# Patient Record
Sex: Female | Born: 1990 | Race: Black or African American | Hispanic: No | Marital: Single | Smoking: Current every day smoker
Health system: Southern US, Community
[De-identification: ages and names within clinical notes are randomized; demographics above are authoritative.]

## PROBLEM LIST (undated history)

## (undated) ENCOUNTER — Inpatient Hospital Stay (HOSPITAL_COMMUNITY): Payer: Self-pay

## (undated) DIAGNOSIS — E785 Hyperlipidemia, unspecified: Secondary | ICD-10-CM

## (undated) DIAGNOSIS — E119 Type 2 diabetes mellitus without complications: Secondary | ICD-10-CM

## (undated) DIAGNOSIS — I10 Essential (primary) hypertension: Secondary | ICD-10-CM

## (undated) DIAGNOSIS — Z720 Tobacco use: Secondary | ICD-10-CM

## (undated) DIAGNOSIS — T7840XA Allergy, unspecified, initial encounter: Secondary | ICD-10-CM

## (undated) HISTORY — DX: Essential (primary) hypertension: I10

## (undated) HISTORY — DX: Tobacco use: Z72.0

## (undated) HISTORY — DX: Allergy, unspecified, initial encounter: T78.40XA

## (undated) HISTORY — PX: WISDOM TOOTH EXTRACTION: SHX21

## (undated) HISTORY — DX: Hyperlipidemia, unspecified: E78.5

---
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
## 2011-02-15 LAB — CBC
HCT: 30.9 % — ABNORMAL LOW (ref 36.0–46.0)
HCT: 36.5 % (ref 36.0–46.0)
Hemoglobin: 10.6 g/dL — ABNORMAL LOW (ref 12.0–15.0)
Hemoglobin: 12.4 g/dL (ref 12.0–15.0)
MCH: 30.5 pg (ref 26.0–34.0)
MCH: 31 pg (ref 26.0–34.0)
MCHC: 33.9 g/dL (ref 30.0–36.0)
MCHC: 34.4 g/dL (ref 30.0–36.0)
MCV: 90 fL (ref 78.0–100.0)
MCV: 90.1 fL (ref 78.0–100.0)
Platelets: 256 10*3/uL (ref 150–400)
Platelets: 325 10*3/uL (ref 150–400)
RBC: 3.43 MIL/uL — ABNORMAL LOW (ref 3.87–5.11)
RBC: 4.05 MIL/uL (ref 3.87–5.11)
RDW: 13.5 % (ref 11.5–15.5)
RDW: 13.6 % (ref 11.5–15.5)
WBC: 17.9 10*3/uL — ABNORMAL HIGH (ref 4.0–10.5)
WBC: 9.6 10*3/uL (ref 4.0–10.5)

## 2011-02-15 LAB — URINE CULTURE
Colony Count: 8000
Culture  Setup Time: 201110290442
Special Requests: NEGATIVE

## 2011-02-15 LAB — URINE MICROSCOPIC-ADD ON

## 2011-02-15 LAB — URINALYSIS, ROUTINE W REFLEX MICROSCOPIC
Bilirubin Urine: NEGATIVE
Glucose, UA: NEGATIVE mg/dL
Ketones, ur: NEGATIVE mg/dL
Nitrite: NEGATIVE
Protein, ur: NEGATIVE mg/dL
Specific Gravity, Urine: 1.015 (ref 1.005–1.030)
Urobilinogen, UA: 1 mg/dL (ref 0.0–1.0)
pH: 6.5 (ref 5.0–8.0)

## 2011-02-15 LAB — RPR: RPR Ser Ql: NONREACTIVE

## 2011-03-07 LAB — URINALYSIS, ROUTINE W REFLEX MICROSCOPIC
Bilirubin Urine: NEGATIVE
Glucose, UA: NEGATIVE mg/dL
Hgb urine dipstick: NEGATIVE
Ketones, ur: NEGATIVE mg/dL
Nitrite: NEGATIVE
Protein, ur: NEGATIVE mg/dL
Specific Gravity, Urine: 1.01 (ref 1.005–1.030)
Urobilinogen, UA: 0.2 mg/dL (ref 0.0–1.0)
pH: 7 (ref 5.0–8.0)

## 2011-03-07 LAB — PREGNANCY, URINE: Preg Test, Ur: NEGATIVE

## 2011-03-08 LAB — URINALYSIS, ROUTINE W REFLEX MICROSCOPIC
Bilirubin Urine: NEGATIVE
Glucose, UA: NEGATIVE mg/dL
Hgb urine dipstick: NEGATIVE
Ketones, ur: NEGATIVE mg/dL
Nitrite: NEGATIVE
Protein, ur: NEGATIVE mg/dL
Specific Gravity, Urine: 1.025 (ref 1.005–1.030)
Urobilinogen, UA: 0.2 mg/dL (ref 0.0–1.0)
pH: 6 (ref 5.0–8.0)

## 2011-03-09 LAB — CBC
HCT: 33.3 % — ABNORMAL LOW (ref 36.0–49.0)
HCT: 29.2 % — ABNORMAL LOW (ref 36.0–49.0)
HCT: 35 % — ABNORMAL LOW (ref 36.0–49.0)
HCT: 37.5 % (ref 36.0–49.0)
Hemoglobin: 11.7 g/dL — ABNORMAL LOW (ref 12.0–16.0)
Hemoglobin: 10 g/dL — ABNORMAL LOW (ref 12.0–16.0)
Hemoglobin: 11.6 g/dL — ABNORMAL LOW (ref 12.0–16.0)
Hemoglobin: 12.6 g/dL (ref 12.0–16.0)
MCHC: 35.1 g/dL (ref 31.0–37.0)
MCHC: 33.2 g/dL (ref 31.0–37.0)
MCHC: 33.5 g/dL (ref 31.0–37.0)
MCHC: 34.2 g/dL (ref 31.0–37.0)
MCV: 88.3 fL (ref 78.0–98.0)
MCV: 90.2 fL (ref 78.0–98.0)
MCV: 91.5 fL (ref 78.0–98.0)
MCV: 91.8 fL (ref 78.0–98.0)
Platelets: 262 10*3/uL (ref 150–400)
Platelets: 224 10*3/uL (ref 150–400)
Platelets: 258 10*3/uL (ref 150–400)
Platelets: 283 10*3/uL (ref 150–400)
RBC: 3.77 MIL/uL — ABNORMAL LOW (ref 3.80–5.70)
RBC: 3.19 MIL/uL — ABNORMAL LOW (ref 3.80–5.70)
RBC: 3.81 MIL/uL (ref 3.80–5.70)
RBC: 4.15 MIL/uL (ref 3.80–5.70)
RDW: 13.7 % (ref 11.4–15.5)
RDW: 13.5 % (ref 11.4–15.5)
RDW: 14.1 % (ref 11.4–15.5)
RDW: 14.2 % (ref 11.4–15.5)
WBC: 9 10*3/uL (ref 4.5–13.5)
WBC: 11 10*3/uL (ref 4.5–13.5)
WBC: 11.9 10*3/uL (ref 4.5–13.5)
WBC: 16.2 10*3/uL — ABNORMAL HIGH (ref 4.5–13.5)

## 2011-03-09 LAB — DIFFERENTIAL
Basophils Absolute: 0 10*3/uL (ref 0.0–0.1)
Basophils Relative: 1 % (ref 0–1)
Eosinophils Absolute: 0.2 10*3/uL (ref 0.0–1.2)
Eosinophils Relative: 2 % (ref 0–5)
Lymphocytes Relative: 18 % — ABNORMAL LOW (ref 24–48)
Lymphs Abs: 1.6 10*3/uL (ref 1.1–4.8)
Monocytes Absolute: 0.6 10*3/uL (ref 0.2–1.2)
Monocytes Relative: 7 % (ref 3–11)
Neutro Abs: 6.5 10*3/uL (ref 1.7–8.0)
Neutrophils Relative %: 73 % — ABNORMAL HIGH (ref 43–71)

## 2011-03-09 LAB — BASIC METABOLIC PANEL
BUN: 3 mg/dL — ABNORMAL LOW (ref 6–23)
CO2: 25 mEq/L (ref 19–32)
Calcium: 8.8 mg/dL (ref 8.4–10.5)
Chloride: 108 mEq/L (ref 96–112)
Creatinine, Ser: 0.54 mg/dL (ref 0.4–1.2)
Glucose, Bld: 110 mg/dL — ABNORMAL HIGH (ref 70–99)
Potassium: 3.4 mEq/L — ABNORMAL LOW (ref 3.5–5.1)
Sodium: 138 mEq/L (ref 135–145)

## 2011-03-09 LAB — RPR: RPR Ser Ql: NONREACTIVE

## 2011-03-11 LAB — URINALYSIS, ROUTINE W REFLEX MICROSCOPIC
Bilirubin Urine: NEGATIVE
Glucose, UA: NEGATIVE mg/dL
Hgb urine dipstick: NEGATIVE
Nitrite: NEGATIVE
Protein, ur: NEGATIVE mg/dL
Specific Gravity, Urine: >1.03 — ABNORMAL HIGH (ref 1.005–1.030)
Urobilinogen, UA: 1 mg/dL (ref 0.0–1.0)
pH: 6 (ref 5.0–8.0)

## 2011-03-21 LAB — URINE CULTURE: Colony Count: >=100000

## 2011-03-21 LAB — URINALYSIS, ROUTINE W REFLEX MICROSCOPIC
Bilirubin Urine: NEGATIVE
Glucose, UA: NEGATIVE mg/dL
Hgb urine dipstick: NEGATIVE
Ketones, ur: NEGATIVE mg/dL
Nitrite: POSITIVE — AB
Protein, ur: NEGATIVE mg/dL
Specific Gravity, Urine: 1.01 (ref 1.005–1.030)
Urobilinogen, UA: 1 mg/dL (ref 0.0–1.0)
pH: 7 (ref 5.0–8.0)

## 2011-03-21 LAB — PREGNANCY, URINE: Preg Test, Ur: POSITIVE

## 2011-03-21 LAB — URINE MICROSCOPIC-ADD ON

## 2012-03-15 ENCOUNTER — Encounter (HOSPITAL_COMMUNITY): Payer: Self-pay | Admitting: Emergency Medicine

## 2012-03-15 ENCOUNTER — Emergency Department (HOSPITAL_COMMUNITY)
Admission: EM | Admit: 2012-03-15 | Discharge: 2012-03-15 | Disposition: A | Discharge status: Home / Self Care | Payer: Medicaid Other | Attending: Emergency Medicine | Admitting: Emergency Medicine

## 2012-03-15 DIAGNOSIS — M542 Cervicalgia: Secondary | ICD-10-CM | POA: Insufficient documentation

## 2012-03-15 DIAGNOSIS — F17200 Nicotine dependence, unspecified, uncomplicated: Secondary | ICD-10-CM | POA: Insufficient documentation

## 2012-03-15 DIAGNOSIS — M62838 Other muscle spasm: Secondary | ICD-10-CM

## 2012-03-15 MED ORDER — HYDROCODONE-ACETAMINOPHEN 5-325 MG PO TABS: ORAL_TABLET | ORAL | Status: AC

## 2012-03-15 MED ORDER — METHOCARBAMOL 500 MG PO TABS: 1000.0000 mg | ORAL_TABLET | Freq: Four times a day (QID) | ORAL | Status: AC | PRN

## 2012-03-15 MED ORDER — NAPROXEN 250 MG PO TABS: 250.0000 mg | ORAL_TABLET | Freq: Two times a day (BID) | ORAL | Status: DC

## 2012-03-15 NOTE — ED Notes (Signed)
Pt c/o neck being stiff since this am

## 2012-03-15 NOTE — Discharge Instructions (Signed)
RESOURCE GUIDE  Dental Problems  Patients with Medicaid: [facility]                     [facility] [address]                                           [address] Phone:  [phone]                                                  Phone:  [phone]  If unable to pay or uninsured, contact:  [facility] or [facility] to become qualified for the adult dental clinic.  Chronic Pain Problems Contact [facility]  [phone] Patients need to be referred by their primary care doctor.  Insufficient Money for Medicine Contact [employer]:  call "211" or [facility] 271-5999.  No Primary Care Doctor Call [facility]  832-8000 Other agencies that provide inexpensive medical care    [facility]  [phone]    [facility]  [phone]    [facility]  [phone]    [facility]  [phone]    [facility]  [phone]    [facility]  [phone]  Psychological Services [facility]  [phone] [facility]  [phone] [facility]   [phone] (emergency services [phone])  Substance Abuse Resources [facility]  336-882-2125 [facility] 336-784-9470 [facility] 336-285-9073 [facility] 336-845-3988 Residential & Outpatient Substance Abuse Program  800-659-3381  Abuse/Neglect [place] Child Abuse Hotline (336) 641-3795 [place] Child Abuse Hotline 800-378-5315 (After Hours)  Emergency Shelter [facility] (336) 271-5985  [facility] (336) 275-9566 [facility] (704) 372-4663  MRSA Hotline #:   832-7006    [place] [facility]  [facility] [place]     [employer]                          [facility] [address] [place]                       [address]      [address]  [place]                                                [place]                            [place] Phone:  [phone]                                   Phone:  [phone]                 Phone:  [phone]  [facility] Phone:  [phone]    [place] Child Abuse Hotline (336) 342-1394 (336) 342-3537 (After Hours)    Take the prescriptions as directed.  Apply moist heat or ice to the area(s) of discomfort, for 15 minutes at a time, several times per day for the next few days.  Do not fall asleep on a heating or ice pack.  Call your regular medical doctor today to schedule a follow up appointment within the next week.  Return to the Emergency Department immediately if worsening.  

## 2012-03-15 NOTE — ED Provider Notes (Signed)
History   This chart was scribed for Laray Anger, DO by Sofie Rower. The patient was seen in room APA04/APA04 and the patient's care was started at 12:19 PM     CSN: [id number]  Arrival date & time 03/15/12  1130   First MD Initiated Contact with Patient 03/15/12 1212      Chief Complaint  Patient presents with  . Neck Pain    HPI  Selena Barr is a 21 y.o. female who presents to the Emergency Department complaining of gradual onset and persistence of constant right sided neck "pain" that began this morning PTA.  Describes the pain as "have a crick in my neck." Pain worsens with palpation of the area and movement/certain positions of the neck.  Denies fevers, no CP/SOB, no rash, no headache, no injury, no visual changes, no focal motor weakness, no tingling/numbness in extremities.    History  Substance Use Topics  . Smoking status: Current Everyday Smoker    Types: Cigarettes  . Smokeless tobacco: Not on file  . Alcohol Use: No    Review of Systems ROS: Statement: All systems negative except as marked or noted in the HPI; Constitutional: Negative for fever and chills. ; ; Eyes: Negative for eye pain, redness and discharge. ; ; ENMT: Negative for ear pain, hoarseness, nasal congestion, sinus pressure and sore throat. ; ; Cardiovascular: Negative for chest pain, palpitations, diaphoresis, dyspnea and peripheral edema. ; ; Respiratory: Negative for cough, wheezing and stridor. ; ; Gastrointestinal: Negative for nausea, vomiting, diarrhea, abdominal pain, blood in stool, hematemesis, jaundice and rectal bleeding. . ; ; Genitourinary: Negative for dysuria, flank pain and hematuria. ; ; Musculoskeletal: +neck pain.  Negative for back pain. Negative for swelling and trauma.; ; Skin: Negative for pruritus, rash, abrasions, blisters, bruising and skin lesion.; ; Neuro: Negative for headache, lightheadedness and neck stiffness. Negative for weakness, altered level of consciousness ,  altered mental status, extremity weakness, paresthesias, involuntary movement, seizure and syncope.     Allergies  Review of patient's allergies indicates no known allergies.  Home Medications  No current outpatient prescriptions on file.  BP 123/54  Pulse 73  Temp(Src) 98.2 F (36.8 C) (Oral)  Resp 18  Ht 5\' 4"  (1.626 m)  Wt 200 lb (90.719 kg)  BMI 34.33 kg/m2  SpO2 96%  LMP 03/09/2012  Physical Exam 1225: Physical examination:  Nursing notes reviewed; Vital signs and O2 SAT reviewed;  Constitutional: Well developed, Well nourished, Well hydrated, In no acute distress; Head:  Normocephalic, atraumatic; Eyes: EOMI, PERRL, No scleral icterus; ENMT: Mouth and pharynx normal, Mucous membranes moist; Neck: Supple, Full range of motion, No lymphadenopathy; Cardiovascular: Regular rate and rhythm, No murmur or gallop; Respiratory: Breath sounds clear & equal bilaterally, No rales, rhonchi, wheezes, Normal respiratory effort/excursion; Chest: Nontender, Movement normal;; Extremities: Pulses normal, No tenderness, No edema, No calf edema or asymmetry.; Spine:  No midline CS, TS, LS tenderness. +TTP right hypertonic trapezius muscle.; Neuro: AA&Ox3, Major CN grossly intact. No facial droop, speech clear, gait steady.  Strength 5/5 equal bilat UE's and LE's, equal grips, DTR 2/4 equal bilat UE's and LE's.  No gross focal motor or sensory deficits in extremities.; Skin: Color normal, Warm, Dry, no rash.    ED Course  Procedures   MDM  MDM Reviewed: nursing note and vitals      12:37 PM:  Appears msk pain at this time, will tx symptomatically.  Dx d/w pt and family.  Questions answered.  Verb understanding, agreeable to d/c home with outpt f/u.        I personally performed the services described in this documentation, which was scribed in my presence. The recorded information has been reviewed and considered. Brynden Thune Allison Quarry, DO 03/15/12 1915

## 2012-06-27 ENCOUNTER — Emergency Department (HOSPITAL_COMMUNITY)
Admission: EM | Admit: 2012-06-27 | Discharge: 2012-06-27 | Discharge status: Against Medical Advice | Payer: Medicaid Other

## 2012-12-05 ENCOUNTER — Encounter (HOSPITAL_COMMUNITY): Payer: Self-pay | Admitting: Emergency Medicine

## 2012-12-05 ENCOUNTER — Emergency Department (HOSPITAL_COMMUNITY)
Admission: EM | Admit: 2012-12-05 | Discharge: 2012-12-05 | Disposition: A | Discharge status: Home / Self Care | Payer: Medicaid Other | Attending: Emergency Medicine | Admitting: Emergency Medicine

## 2012-12-05 DIAGNOSIS — R062 Wheezing: Secondary | ICD-10-CM | POA: Insufficient documentation

## 2012-12-05 DIAGNOSIS — Z3202 Encounter for pregnancy test, result negative: Secondary | ICD-10-CM | POA: Insufficient documentation

## 2012-12-05 DIAGNOSIS — F17200 Nicotine dependence, unspecified, uncomplicated: Secondary | ICD-10-CM | POA: Insufficient documentation

## 2012-12-05 DIAGNOSIS — R002 Palpitations: Secondary | ICD-10-CM

## 2012-12-05 DIAGNOSIS — E876 Hypokalemia: Secondary | ICD-10-CM | POA: Insufficient documentation

## 2012-12-05 LAB — URINALYSIS, ROUTINE W REFLEX MICROSCOPIC
Bilirubin Urine: NEGATIVE
Glucose, UA: NEGATIVE mg/dL
Ketones, ur: NEGATIVE mg/dL
Protein, ur: NEGATIVE mg/dL
pH: 5.5 (ref 5.0–8.0)

## 2012-12-05 LAB — CBC
HCT: 41.7 % (ref 36.0–46.0)
MCH: 29.4 pg (ref 26.0–34.0)
MCV: 84.6 fL (ref 78.0–100.0)
Platelets: 373 10*3/uL (ref 150–400)
RDW: 13.8 % (ref 11.5–15.5)
WBC: 10 10*3/uL (ref 4.0–10.5)

## 2012-12-05 LAB — URINE MICROSCOPIC-ADD ON

## 2012-12-05 LAB — BASIC METABOLIC PANEL
BUN: 10 mg/dL (ref 6–23)
CO2: 26 mEq/L (ref 19–32)
Calcium: 9.1 mg/dL (ref 8.4–10.5)
Chloride: 102 mEq/L (ref 96–112)
Creatinine, Ser: 0.78 mg/dL (ref 0.50–1.10)

## 2012-12-05 MED ORDER — POTASSIUM CHLORIDE CRYS ER 20 MEQ PO TBCR
60.0000 meq | EXTENDED_RELEASE_TABLET | Freq: Once | ORAL | Status: AC
  Administered 2012-12-05: 60 meq via ORAL
  Filled 2012-12-05: qty 3

## 2012-12-05 NOTE — Discharge Instructions (Signed)
Your blood work and urine were normal except for a little bit low potassium. We have given you potassium here. Your EKG was normal. Follow up with your doctor.

## 2012-12-05 NOTE — ED Provider Notes (Addendum)
History     CSN: [id number]  Arrival date & time 12/05/12  0240   First MD Initiated Contact with Patient 12/05/12 [phone]      Chief Complaint  Patient presents with  . Palpitations    (Consider location/radiation/quality/duration/timing/severity/associated sxs/prior treatment) HPI Selena Barr is a 22 y.o. female who presents to the Emergency Department complaining of palpitations that woke her from sleep. When she got up her heart was pounding. She denies fever, chills, nausea, vomiting, shortness of breath or chest pain. She came directly to the ER. Since arrival her heart rate has slowed and she feels better. She denies increased caffeine, smoking, drug use,  She had taken no medicines. Nothing she did at home before coming to the ER made the palpitations better.     History reviewed. No pertinent past medical history.  History reviewed. No pertinent past surgical history.  History reviewed. No pertinent family history.  History  Substance Use Topics  . Smoking status: Current Every Day Smoker    Types: Cigarettes  . Smokeless tobacco: Not on file  . Alcohol Use: Yes    OB History    Grav Para Term Preterm Abortions TAB SAB Ect Mult Living                  Review of Systems  Constitutional: Negative for fever.       10 Systems reviewed and are negative for acute change except as noted in the HPI.  HENT: Negative for congestion.   Eyes: Negative for discharge and redness.  Respiratory: Negative for cough and shortness of breath.   Cardiovascular: Positive for palpitations. Negative for chest pain.  Gastrointestinal: Negative for vomiting and abdominal pain.  Musculoskeletal: Negative for back pain.  Skin: Negative for rash.  Neurological: Negative for syncope, numbness and headaches.  Psychiatric/Behavioral:       No behavior change.    Allergies  Review of patient's allergies indicates no known allergies.  Home Medications   Current Outpatient Rx    Name  Route  Sig  Dispense  Refill  . ETONOGESTREL 68 MG [facility] IMPL   Subcutaneous   Inject 1 each into the skin once.         Marland Kitchen NAPROXEN 250 MG PO TABS   Oral   Take 1 tablet (250 mg total) by mouth 2 (two) times daily with a meal.   14 tablet   0     BP 119/72  Pulse 92  Temp 97.9 F (36.6 C) (Oral)  Resp 16  Ht 5\' 4"  (1.626 m)  Wt 250 lb (113.399 kg)  BMI 42.91 kg/m2  SpO2 96%  LMP 12/02/2012  Physical Exam  Nursing note and vitals reviewed. Constitutional:       Awake, alert, nontoxic appearance.  HENT:  Head: Atraumatic.  Eyes: Right eye exhibits no discharge. Left eye exhibits no discharge.  Neck: Neck supple.  Cardiovascular: Normal heart sounds and intact distal pulses.   Pulmonary/Chest: Effort normal. She has wheezes. She has no rales. She exhibits no tenderness.  Abdominal: Soft. There is no tenderness. There is no rebound.  Musculoskeletal: She exhibits no tenderness.       Baseline ROM, no obvious new focal weakness.  Neurological:       Mental status and motor strength appears baseline for patient and situation.  Skin: No rash noted.  Psychiatric: She has a normal mood and affect.    ED Course  Procedures (including critical care time)  Results  for orders placed during the hospital encounter of 12/05/12  CBC      Component Value Range   WBC 10.0  4.0 - 10.5 K/uL   RBC 4.93  3.87 - 5.11 MIL/uL   Hemoglobin 14.5  12.0 - 15.0 g/dL   HCT 16.1  09.6 - 04.5 %   MCV 84.6  78.0 - 100.0 fL   MCH 29.4  26.0 - 34.0 pg   MCHC 34.8  30.0 - 36.0 g/dL   RDW 40.9  81.1 - 91.4 %   Platelets 373  150 - 400 K/uL  BASIC METABOLIC PANEL      Component Value Range   Sodium 137  135 - 145 mEq/L   Potassium 3.1 (*) 3.5 - 5.1 mEq/L   Chloride 102  96 - 112 mEq/L   CO2 26  19 - 32 mEq/L   Glucose, Bld 113 (*) 70 - 99 mg/dL   BUN 10  6 - 23 mg/dL   Creatinine, Ser 7.82  0.50 - 1.10 mg/dL   Calcium 9.1  8.4 - 95.6 mg/dL   GFR calc non Af Amer >90  >90 mL/min    GFR calc Af Amer >90  >90 mL/min  URINALYSIS, ROUTINE W REFLEX MICROSCOPIC      Component Value Range   Color, Urine STRAW (*) YELLOW   APPearance CLEAR  CLEAR   Specific Gravity, Urine 1.010  1.005 - 1.030   pH 5.5  5.0 - 8.0   Glucose, UA NEGATIVE  NEGATIVE mg/dL   Hgb urine dipstick SMALL (*) NEGATIVE   Bilirubin Urine NEGATIVE  NEGATIVE   Ketones, ur NEGATIVE  NEGATIVE mg/dL   Protein, ur NEGATIVE  NEGATIVE mg/dL   Urobilinogen, UA 0.2  0.0 - 1.0 mg/dL   Nitrite NEGATIVE  NEGATIVE   Leukocytes, UA TRACE (*) NEGATIVE  PREGNANCY, URINE      Component Value Range   Preg Test, Ur NEGATIVE  NEGATIVE  URINE MICROSCOPIC-ADD ON      Component Value Range   Squamous Epithelial / LPF RARE  RARE   WBC, UA 0-2  <3 WBC/hpf   RBC / HPF 0-2  <3 RBC/hpf   Bacteria, UA RARE  RARE   . Date: 12/05/2012  0247  Rate: 108  Rhythm: sinus tachycardia  QRS Axis: normal  Intervals: normal  ST/T Wave abnormalities: normal  Conduction Disutrbances: none  Narrative Interpretation: unremarkable    1. Palpitations   2. Hypokalemia     0535 Reviewed results with patient. HR now 99. Patient just got back from ambulating to the bathroom.   MDM  Patient presents with palpitations that were not associated with shortness of breath or chest pain. Heart rate slowed without intervention. Patient ambulated in hallway without tachycardia. Took PO fluids and ate a snack. Reviewed lab and EKG results with the patient.  She feels better and will be discharged home . Pt stable in ED with no significant deterioration in condition.The patient appears reasonably screened and/or stabilized for discharge and I doubt any other medical condition or other [facility] requiring further screening, evaluation, or treatment in the ED at this time prior to discharge.  MDM Reviewed: nursing note and vitals Interpretation: labs and ECG           Nicoletta Dress. Colon Branch, MD 12/05/12 0740  Nicoletta Dress. Colon Branch, MD 12/05/12 [phone]

## 2012-12-05 NOTE — ED Notes (Signed)
Pt states she woke with her heart racing and states she feels nervous. Pt denies any chest pain or dizziness.

## 2013-02-02 ENCOUNTER — Emergency Department (HOSPITAL_COMMUNITY)
Admission: EM | Admit: 2013-02-02 | Discharge: 2013-02-02 | Disposition: A | Discharge status: Home / Self Care | Payer: Medicaid Other | Attending: Emergency Medicine | Admitting: Emergency Medicine

## 2013-02-02 ENCOUNTER — Encounter (HOSPITAL_COMMUNITY): Payer: Self-pay | Admitting: Emergency Medicine

## 2013-02-02 DIAGNOSIS — R112 Nausea with vomiting, unspecified: Secondary | ICD-10-CM | POA: Insufficient documentation

## 2013-02-02 DIAGNOSIS — F17200 Nicotine dependence, unspecified, uncomplicated: Secondary | ICD-10-CM | POA: Insufficient documentation

## 2013-02-02 DIAGNOSIS — R6883 Chills (without fever): Secondary | ICD-10-CM | POA: Insufficient documentation

## 2013-02-02 DIAGNOSIS — R42 Dizziness and giddiness: Secondary | ICD-10-CM | POA: Insufficient documentation

## 2013-02-02 DIAGNOSIS — R5381 Other malaise: Secondary | ICD-10-CM | POA: Insufficient documentation

## 2013-02-02 DIAGNOSIS — R197 Diarrhea, unspecified: Secondary | ICD-10-CM | POA: Insufficient documentation

## 2013-02-02 DIAGNOSIS — R1013 Epigastric pain: Secondary | ICD-10-CM | POA: Insufficient documentation

## 2013-02-02 LAB — CBC WITH DIFFERENTIAL/PLATELET
Eosinophils Absolute: 0.1 10*3/uL (ref 0.0–0.7)
Hemoglobin: 15.7 g/dL — ABNORMAL HIGH (ref 12.0–15.0)
Lymphocytes Relative: 8 % — ABNORMAL LOW (ref 12–46)
Lymphs Abs: 0.9 10*3/uL (ref 0.7–4.0)
Monocytes Relative: 7 % (ref 3–12)
Neutro Abs: 9.4 10*3/uL — ABNORMAL HIGH (ref 1.7–7.7)
Neutrophils Relative %: 84 % — ABNORMAL HIGH (ref 43–77)
Platelets: 382 10*3/uL (ref 150–400)
RBC: 5.25 MIL/uL — ABNORMAL HIGH (ref 3.87–5.11)
WBC: 11.3 10*3/uL — ABNORMAL HIGH (ref 4.0–10.5)

## 2013-02-02 LAB — COMPREHENSIVE METABOLIC PANEL
ALT: 22 U/L (ref 0–35)
Alkaline Phosphatase: 92 U/L (ref 39–117)
CO2: 24 mEq/L (ref 19–32)
Chloride: 102 mEq/L (ref 96–112)
GFR calc Af Amer: >90 mL/min (ref >90)
Glucose, Bld: 137 mg/dL — ABNORMAL HIGH (ref 70–99)
Potassium: 4.1 mEq/L (ref 3.5–5.1)
Sodium: 137 mEq/L (ref 135–145)
Total Bilirubin: 0.4 mg/dL (ref 0.3–1.2)
Total Protein: 7.6 g/dL (ref 6.0–8.3)

## 2013-02-02 MED ORDER — HYDROMORPHONE HCL PF 1 MG/ML IJ SOLN
0.5000 mg | Freq: Once | INTRAMUSCULAR | Status: AC
  Administered 2013-02-02: 0.5 mg via INTRAVENOUS

## 2013-02-02 MED ORDER — ONDANSETRON HCL 4 MG/2ML IJ SOLN
4.0000 mg | Freq: Once | INTRAMUSCULAR | Status: AC
  Administered 2013-02-02: 4 mg via INTRAVENOUS

## 2013-02-02 MED ORDER — HYDROMORPHONE HCL PF 1 MG/ML IJ SOLN
INTRAMUSCULAR | Status: AC
  Administered 2013-02-02: 0.5 mg via INTRAVENOUS
  Filled 2013-02-02: qty 1

## 2013-02-02 MED ORDER — RANITIDINE HCL 150 MG PO CAPS: 150.0000 mg | ORAL_CAPSULE | Freq: Two times a day (BID) | ORAL | Status: DC

## 2013-02-02 MED ORDER — PROMETHAZINE HCL 25 MG PO TABS: 25.0000 mg | ORAL_TABLET | Freq: Four times a day (QID) | ORAL | Status: DC | PRN

## 2013-02-02 MED ORDER — ONDANSETRON HCL 4 MG/2ML IJ SOLN
INTRAMUSCULAR | Status: AC
  Administered 2013-02-02: 4 mg via INTRAVENOUS
  Filled 2013-02-02: qty 2

## 2013-02-02 MED ORDER — ONDANSETRON HCL 4 MG/2ML IJ SOLN
4.0000 mg | Freq: Once | INTRAMUSCULAR | Status: AC
  Administered 2013-02-02: 4 mg via INTRAVENOUS
  Filled 2013-02-02: qty 2

## 2013-02-02 MED ORDER — SODIUM CHLORIDE 0.9 % IV BOLUS (SEPSIS)
1000.0000 mL | Freq: Once | INTRAVENOUS | Status: AC
  Administered 2013-02-02: 1000 mL via INTRAVENOUS

## 2013-02-02 MED ORDER — PANTOPRAZOLE SODIUM 40 MG IV SOLR
40.0000 mg | Freq: Once | INTRAVENOUS | Status: AC
  Administered 2013-02-02: 40 mg via INTRAVENOUS
  Filled 2013-02-02: qty 40

## 2013-02-02 NOTE — ED Notes (Signed)
Pt c/o n/v/d and dizziness since 0330 this am.

## 2013-02-02 NOTE — ED Provider Notes (Signed)
History    This chart was scribed for Selena Lennert, MD by Charolett Bumpers, ED Scribe. The patient was seen in room APA06/APA06. Patient's care was started at 0728.   CSN: [id number]  Arrival date & time 02/02/13  0709   First MD Initiated Contact with Patient 02/02/13 [phone]      Chief Complaint  Patient presents with  . Emesis   Selena Barr is a 22 y.o. female who presents to the Emergency Department complaining of multiple episodes of emesis with associated nausea, generalized weakness, dizziness and chills that started this morning around 3:30 am. She reports one episode of diarrhea and mild epigastric abdominal pain described as pressure. She denies any fever, hematemesis, cough, rhinorrhea. LNMP was a couple of months ago, she is on Implanon.   Patient is a 22 y.o. female presenting with vomiting. The history is provided by the patient. No language interpreter was used.  Emesis Duration:  4 hours Quality:  Stomach contents and bilious material Progression:  Worsening Chronicity:  New Ineffective treatments:  None tried Associated symptoms: abdominal pain, chills and diarrhea   Associated symptoms: no headaches     History reviewed. No pertinent past medical history.  History reviewed. No pertinent past surgical history.  No family history on file.  History  Substance Use Topics  . Smoking status: Current Every Day Smoker    Types: Cigarettes  . Smokeless tobacco: Not on file  . Alcohol Use: Yes    OB History   Grav Para Term Preterm Abortions TAB SAB Ect Mult Living                  Review of Systems  Constitutional: Positive for chills. Negative for fever and fatigue.  HENT: Negative for congestion, rhinorrhea, sinus pressure and ear discharge.   Eyes: Negative for discharge.  Respiratory: Negative for cough.   Cardiovascular: Negative for chest pain.  Gastrointestinal: Positive for nausea, vomiting, abdominal pain and diarrhea.  Genitourinary:  Negative for frequency and hematuria.  Musculoskeletal: Negative for back pain.  Skin: Negative for rash.  Neurological: Positive for dizziness and weakness. Negative for seizures and headaches.  Psychiatric/Behavioral: Negative for hallucinations.  All other systems reviewed and are negative.    Allergies  Review of patient's allergies indicates no known allergies.  Home Medications   Current Outpatient Rx  Name  Route  Sig  Dispense  Refill  . etonogestrel (IMPLANON) 68 MG IMPL implant   Subcutaneous   Inject 1 each into the skin once.         . naproxen (NAPROSYN) 250 MG tablet   Oral   Take 1 tablet (250 mg total) by mouth 2 (two) times daily with a meal.   14 tablet   0     BP 122/65  Pulse 100  Temp(Src) 98.3 F (36.8 C)  Resp 18  Ht 5\' 3"  (1.6 m)  Wt 270 lb (122.471 kg)  BMI 47.84 kg/m2  SpO2 97%  Physical Exam  Nursing note and vitals reviewed. Constitutional: She is oriented to person, place, and time. She appears well-developed and well-nourished.  HENT:  Head: Normocephalic and atraumatic.  Eyes: Conjunctivae and EOM are normal. No scleral icterus.  Neck: Neck supple. No thyromegaly present.  Cardiovascular: Normal rate, regular rhythm and normal heart sounds.  Exam reveals no gallop and no friction rub.   No murmur heard. Pulmonary/Chest: Effort normal and breath sounds normal. No stridor. She has no wheezes. She has no rales.  She exhibits no tenderness.  Abdominal: Soft. Bowel sounds are normal. She exhibits no distension. There is tenderness. There is no rebound.  Mild epigastric tenderness.   Musculoskeletal: Normal range of motion. She exhibits no edema.  Lymphadenopathy:    She has no cervical adenopathy.  Neurological: She is alert and oriented to person, place, and time. Coordination normal.  Skin: No rash noted. No erythema.  Psychiatric: She has a normal mood and affect. Her behavior is normal.    ED Course  Procedures (including  critical care time)  DIAGNOSTIC STUDIES: Oxygen Saturation is 97% on room air, adequate by my interpretation.    COORDINATION OF CARE:  07:40-Discussed planned course of treatment with the patient including IV fluids, pain and nausea management, CBC with diff and CMP, who is agreeable at this time.   07:45-Medication Orders: Sodium chloride 0.9% bolus 1,000 mL-once. Pantoprazole (Protonix) injection 40 mg-once; Ondansetron (Zofran) injection 4 mg-once  09:00-Per nurse, pt is still having nausea and pain. Will order additional medications.  09:15-Medication Orders: Ondansetron (Zofran) injection 4 mg-once; Hydromorphone (Dilaudid) injection 0.5 mg-once  10:11-Recheck: Pt states that she is feeling improved. Informed her of unremarkable labs. Will d/c home with nausea medication and something to reduce her stomach acid. Advised to f/u with her PCP if symptoms continue. She acknowledged and agreed with plan.   Results for orders placed during the hospital encounter of 02/02/13  CBC WITH DIFFERENTIAL      Result Value Range   WBC 11.3 (*) 4.0 - 10.5 K/uL   RBC 5.25 (*) 3.87 - 5.11 MIL/uL   Hemoglobin 15.7 (*) 12.0 - 15.0 g/dL   HCT 86.5  78.4 - 69.6 %   MCV 84.8  78.0 - 100.0 fL   MCH 29.9  26.0 - 34.0 pg   MCHC 35.3  30.0 - 36.0 g/dL   RDW 29.5  28.4 - 13.2 %   Platelets 382  150 - 400 K/uL   Neutrophils Relative 84 (*) 43 - 77 %   Neutro Abs 9.4 (*) 1.7 - 7.7 K/uL   Lymphocytes Relative 8 (*) 12 - 46 %   Lymphs Abs 0.9  0.7 - 4.0 K/uL   Monocytes Relative 7  3 - 12 %   Monocytes Absolute 0.8  0.1 - 1.0 K/uL   Eosinophils Relative 1  0 - 5 %   Eosinophils Absolute 0.1  0.0 - 0.7 K/uL   Basophils Relative 0  0 - 1 %   Basophils Absolute 0.0  0.0 - 0.1 K/uL  COMPREHENSIVE METABOLIC PANEL      Result Value Range   Sodium 137  135 - 145 mEq/L   Potassium 4.1  3.5 - 5.1 mEq/L   Chloride 102  96 - 112 mEq/L   CO2 24  19 - 32 mEq/L   Glucose, Bld 137 (*) 70 - 99 mg/dL   BUN 14  6  - 23 mg/dL   Creatinine, Ser 4.40  0.50 - 1.10 mg/dL   Calcium 9.4  8.4 - 10.2 mg/dL   Total Protein 7.6  6.0 - 8.3 g/dL   Albumin 3.7  3.5 - 5.2 g/dL   AST 22  0 - 37 U/L   ALT 22  0 - 35 U/L   Alkaline Phosphatase 92  39 - 117 U/L   Total Bilirubin 0.4  0.3 - 1.2 mg/dL   GFR calc non Af Amer >90  >90 mL/min   GFR calc Af Amer >90  >90 mL/min  No results found.   No diagnosis found.    MDM      The chart was scribed for me under my direct supervision.  I personally performed the history, physical, and medical decision making and all procedures in the evaluation of this patient.Selena Lennert, MD 02/02/13 1014

## 2013-04-14 ENCOUNTER — Ambulatory Visit (INDEPENDENT_AMBULATORY_CARE_PROVIDER_SITE_OTHER): Payer: Medicaid Other | Admitting: Pediatrics

## 2013-04-14 VITALS — BP 118/66 | Temp 97.9°F | Wt 274.2 lb

## 2013-04-14 DIAGNOSIS — J302 Other seasonal allergic rhinitis: Secondary | ICD-10-CM

## 2013-04-14 DIAGNOSIS — J019 Acute sinusitis, unspecified: Secondary | ICD-10-CM

## 2013-04-14 DIAGNOSIS — J309 Allergic rhinitis, unspecified: Secondary | ICD-10-CM

## 2013-04-14 MED ORDER — OLOPATADINE HCL 0.2 % OP SOLN: OPHTHALMIC | Status: AC

## 2013-04-14 MED ORDER — AMOXICILLIN-POT CLAVULANATE 500-125 MG PO TABS: ORAL_TABLET | ORAL | Status: AC

## 2013-04-14 MED ORDER — FLUTICASONE PROPIONATE 50 MCG/ACT NA SUSP: 2.0000 | Freq: Every day | NASAL | Status: DC

## 2013-04-14 MED ORDER — CETIRIZINE HCL 10 MG PO TABS: ORAL_TABLET | ORAL | Status: DC

## 2013-04-16 ENCOUNTER — Encounter: Payer: Self-pay | Admitting: Pediatrics

## 2013-04-16 DIAGNOSIS — J302 Other seasonal allergic rhinitis: Secondary | ICD-10-CM | POA: Insufficient documentation

## 2013-04-16 DIAGNOSIS — J019 Acute sinusitis, unspecified: Secondary | ICD-10-CM | POA: Insufficient documentation

## 2013-04-16 NOTE — Progress Notes (Signed)
Subjective:     Patient ID: Selena Barr, female   DOB: July 06, 1991, 22 y.o.   MRN: [id number]  HPI: patient here with allergies. States that she has had it for one week. Denies any fevers, vomiting, diarrhea or rashes. Denies being pregnant, has an implant. States that she does have headaches.    ROS:  Apart from the symptoms reviewed above, there are no other symptoms referable to all systems reviewed.   Physical Examination  Blood pressure 118/66, temperature 97.9 F (36.6 C), temperature source Temporal, weight 274 lb 4 oz (124.399 kg). General: Alert, NAD HEENT: TM's - clear, Throat - clear, Neck - FROM, no meningismus, Sclera - clear, turbinates swollen and positive for maxillary pain. LYMPH NODES: No LN noted LUNGS: CTA B, no wheezing or crackles. CV: RRR without Murmurs GU: Not Examined SKIN: Clear, No rashes noted NEUROLOGICAL: Grossly intact MUSCULOSKELETAL: Not examined  No results found. No results found for this or any previous visit (from the past 240 hour(s)). No results found for this or any previous visit (from the past 48 hour(s)).  Assessment:   Seasonal allergies Sinusitis.  Plan:   Current Outpatient Prescriptions  Medication Sig Dispense Refill  . amoxicillin-clavulanate (AUGMENTIN) 500-125 MG per tablet One tab twice a day for 10 days.  20 tablet  0  . cetirizine (ZYRTEC) 10 MG tablet One tab before bedtime for allergies.  30 tablet  2  . etonogestrel (IMPLANON) 68 MG IMPL implant Inject 1 each into the skin once.      . fluticasone (FLONASE) 50 MCG/ACT nasal spray Place 2 sprays into the nose daily.  16 g  2  . Olopatadine HCl 0.2 % SOLN One drop to the effected eye once a day as needed for itching.  1 Bottle  0  . promethazine (PHENERGAN) 25 MG tablet Take 1 tablet (25 mg total) by mouth every 6 (six) hours as needed for nausea.  15 tablet  0  . ranitidine (ZANTAC) 150 MG capsule Take 1 capsule (150 mg total) by mouth 2 (two) times daily.  30 capsule   0   No current facility-administered medications for this visit.   Recheck prn.

## 2013-04-29 ENCOUNTER — Encounter (HOSPITAL_COMMUNITY): Payer: Self-pay

## 2013-04-29 ENCOUNTER — Emergency Department (HOSPITAL_COMMUNITY)
Admission: EM | Admit: 2013-04-29 | Discharge: 2013-04-29 | Disposition: A | Discharge status: Home / Self Care | Payer: Medicaid Other | Attending: Emergency Medicine | Admitting: Emergency Medicine

## 2013-04-29 DIAGNOSIS — IMO0002 Reserved for concepts with insufficient information to code with codable children: Secondary | ICD-10-CM | POA: Insufficient documentation

## 2013-04-29 DIAGNOSIS — F17200 Nicotine dependence, unspecified, uncomplicated: Secondary | ICD-10-CM | POA: Insufficient documentation

## 2013-04-29 DIAGNOSIS — H9209 Otalgia, unspecified ear: Secondary | ICD-10-CM | POA: Insufficient documentation

## 2013-04-29 DIAGNOSIS — H9201 Otalgia, right ear: Secondary | ICD-10-CM

## 2013-04-29 DIAGNOSIS — K029 Dental caries, unspecified: Secondary | ICD-10-CM | POA: Insufficient documentation

## 2013-04-29 DIAGNOSIS — K089 Disorder of teeth and supporting structures, unspecified: Secondary | ICD-10-CM | POA: Insufficient documentation

## 2013-04-29 DIAGNOSIS — J3489 Other specified disorders of nose and nasal sinuses: Secondary | ICD-10-CM | POA: Insufficient documentation

## 2013-04-29 DIAGNOSIS — K0889 Other specified disorders of teeth and supporting structures: Secondary | ICD-10-CM

## 2013-04-29 MED ORDER — ANTIPYRINE-BENZOCAINE 5.4-1.4 % OT SOLN: 3.0000 [drp] | OTIC | Status: DC | PRN

## 2013-04-29 MED ORDER — CLINDAMYCIN HCL 300 MG PO CAPS: 300.0000 mg | ORAL_CAPSULE | Freq: Four times a day (QID) | ORAL | Status: DC

## 2013-04-29 MED ORDER — HYDROCODONE-ACETAMINOPHEN 5-325 MG PO TABS: ORAL_TABLET | ORAL | Status: DC

## 2013-04-29 NOTE — ED Notes (Signed)
Pt c/o r rearache since last week.  Reports was put on antibiotics last week but is no better.

## 2013-05-04 NOTE — ED Provider Notes (Signed)
Medical screening examination/treatment/procedure(s) were performed by non-physician practitioner and as supervising physician I was immediately available for consultation/collaboration.  Shelda Jakes, MD 05/04/13 [phone]

## 2013-05-04 NOTE — ED Provider Notes (Signed)
History     CSN: [id number]  Arrival date & time 04/29/13  1218   First MD Initiated Contact with Patient 04/29/13 1343      Chief Complaint  Patient presents with  . Otalgia    (Consider location/radiation/quality/duration/timing/severity/associated sxs/prior treatment) Patient is a 22 y.o. female presenting with ear pain. The history is provided by the patient.  Otalgia Location:  Right Behind ear:  No abnormality Quality:  Throbbing and sore Severity:  Mild Onset quality:  Gradual Duration:  1 week Timing:  Constant Progression:  Unchanged Chronicity:  New Context comment:  Patient also has toothache Relieved by:  Nothing Worsened by:  Nothing tried Ineffective treatments: antibiotics and tylenol. Associated symptoms: congestion   Associated symptoms: no ear discharge, no fever, no headaches, no hearing loss, no neck pain, no rash, no rhinorrhea, no sore throat, no tinnitus and no vomiting   Congestion:    Location:  Nasal   Interferes with sleep: no     Interferes with eating/drinking: no     History reviewed. No pertinent past medical history.  History reviewed. No pertinent past surgical history.  No family history on file.  History  Substance Use Topics  . Smoking status: Current Every Day Smoker    Types: Cigarettes  . Smokeless tobacco: Not on file  . Alcohol Use: Yes     Comment: occ    OB History   Grav Para Term Preterm Abortions TAB SAB Ect Mult Living                  Review of Systems  Constitutional: Negative for fever and appetite change.  HENT: Positive for ear pain, congestion and dental problem. Negative for hearing loss, sore throat, facial swelling, rhinorrhea, trouble swallowing, neck pain, neck stiffness, tinnitus and ear discharge.   Eyes: Negative for pain and visual disturbance.  Gastrointestinal: Negative for vomiting.  Skin: Negative for rash.  Neurological: Negative for dizziness, facial asymmetry and headaches.   Hematological: Negative for adenopathy.  All other systems reviewed and are negative.    Allergies  Review of patient's allergies indicates no known allergies.  Home Medications   Current Outpatient Rx  Name  Route  Sig  Dispense  Refill  . acetaminophen (TYLENOL) 325 MG tablet   Oral   Take 650 mg by mouth every 6 (six) hours as needed for pain.         . cetirizine (ZYRTEC) 10 MG tablet      One tab before bedtime for allergies.   30 tablet   2   . fluticasone (FLONASE) 50 MCG/ACT nasal spray   Nasal   Place 2 sprays into the nose daily.   16 g   2   . antipyrine-benzocaine (AURALGAN) otic solution   Right Ear   Place 3 drops into the right ear every 2 (two) hours as needed for pain.   10 mL   0   . clindamycin (CLEOCIN) 300 MG capsule   Oral   Take 1 capsule (300 mg total) by mouth 4 (four) times daily. For 7 days   28 capsule   0   . etonogestrel (IMPLANON) 68 MG IMPL implant   Subcutaneous   Inject 1 each into the skin once.         Marland Kitchen HYDROcodone-acetaminophen (NORCO/VICODIN) 5-325 MG per tablet      Take one-two tabs po q 4-6 hrs prn pain   20 tablet   0     BP  118/79  Pulse 77  Temp(Src) 97.6 F (36.4 C) (Oral)  Resp 20  Ht 5\' 4"  (1.626 m)  Wt 275 lb (124.739 kg)  BMI 47.18 kg/m2  SpO2 100%  Physical Exam  Nursing note and vitals reviewed. Constitutional: She is oriented to person, place, and time. She appears well-developed and well-nourished. No distress.  HENT:  Head: Normocephalic and atraumatic. No trismus in the jaw.  Right Ear: Tympanic membrane and ear canal normal. No mastoid tenderness. Tympanic membrane is not perforated, not erythematous and not bulging. No hemotympanum.  Left Ear: Tympanic membrane and ear canal normal.  Mouth/Throat: Uvula is midline, oropharynx is clear and moist and mucous membranes are normal. Dental caries present. No dental abscesses or edematous.  Multiple dental caries of the right upper and lower  teeth.  No dental abscess, trismus or facial edema  Eyes: Conjunctivae and EOM are normal. Pupils are equal, round, and reactive to light.  Neck: Normal range of motion. Neck supple.  Cardiovascular: Normal rate, regular rhythm, normal heart sounds and intact distal pulses.   No murmur heard. Pulmonary/Chest: Effort normal and breath sounds normal. No respiratory distress.  Musculoskeletal: Normal range of motion.  Lymphadenopathy:    She has no cervical adenopathy.  Neurological: She is alert and oriented to person, place, and time. She exhibits normal muscle tone. Coordination normal.  Skin: Skin is warm and dry.    ED Course  Procedures (including critical care time)  Labs Reviewed - No data to display No results found.   1. Otalgia, right   2. Pain, dental       MDM     VSS.  Patient is alert, non-toxic appearing, stable for d/c.  Agrees to f/u with a dentist, referral info given.  Right TM appears nml, likely related to dental pain     Oisin Yoakum L. Darnetta Kesselman, PA-C 05/04/13 1103

## 2013-07-04 ENCOUNTER — Other Ambulatory Visit: Payer: Self-pay | Admitting: Advanced Practice Midwife

## 2013-07-06 ENCOUNTER — Encounter (HOSPITAL_COMMUNITY): Payer: Self-pay

## 2013-07-06 ENCOUNTER — Emergency Department (HOSPITAL_COMMUNITY)
Admission: EM | Admit: 2013-07-06 | Discharge: 2013-07-06 | Disposition: A | Discharge status: Home / Self Care | Payer: Medicaid Other | Attending: Emergency Medicine | Admitting: Emergency Medicine

## 2013-07-06 DIAGNOSIS — F17200 Nicotine dependence, unspecified, uncomplicated: Secondary | ICD-10-CM | POA: Insufficient documentation

## 2013-07-06 DIAGNOSIS — Z79899 Other long term (current) drug therapy: Secondary | ICD-10-CM | POA: Insufficient documentation

## 2013-07-06 DIAGNOSIS — IMO0002 Reserved for concepts with insufficient information to code with codable children: Secondary | ICD-10-CM | POA: Insufficient documentation

## 2013-07-06 DIAGNOSIS — K029 Dental caries, unspecified: Secondary | ICD-10-CM | POA: Insufficient documentation

## 2013-07-06 MED ORDER — OXYCODONE-ACETAMINOPHEN 5-325 MG PO TABS
1.0000 | ORAL_TABLET | Freq: Once | ORAL | Status: AC
  Administered 2013-07-06: 1 via ORAL
  Filled 2013-07-06: qty 1

## 2013-07-06 MED ORDER — AMOXICILLIN 500 MG PO CAPS: 1000.0000 mg | ORAL_CAPSULE | Freq: Two times a day (BID) | ORAL | Status: DC

## 2013-07-06 MED ORDER — AMOXICILLIN 250 MG PO CAPS
1000.0000 mg | ORAL_CAPSULE | Freq: Once | ORAL | Status: AC
  Administered 2013-07-06: 1000 mg via ORAL
  Filled 2013-07-06: qty 4

## 2013-07-06 MED ORDER — OXYCODONE-ACETAMINOPHEN 5-325 MG PO TABS: 1.0000 | ORAL_TABLET | ORAL | Status: DC | PRN

## 2013-07-06 NOTE — ED Provider Notes (Signed)
CSN: [id number]     Arrival date & time 07/06/13  0356 History     First MD Initiated Contact with Patient 07/06/13 [phone]     Chief Complaint  Patient presents with  . Dental Pain   (Consider location/radiation/quality/duration/timing/severity/associated sxs/prior Treatment) Patient is a 22 y.o. female presenting with tooth pain. The history is provided by the patient.  Dental Pain She noted onset at about midnight of severe pain in a right lower molar. Pain is 10/10. She states that she had some milder pain in that tooth several weeks ago but it resolved. Pain is worse with chewing or exposure to hair. She took acetaminophen with no relief. Nothing makes it feel any better.  History reviewed. No pertinent past medical history. History reviewed. No pertinent past surgical history. History reviewed. No pertinent family history. History  Substance Use Topics  . Smoking status: Current Every Day Smoker -- 0.50 packs/day    Types: Cigarettes  . Smokeless tobacco: Not on file  . Alcohol Use: Yes     Comment: occ   OB History   Grav Para Term Preterm Abortions TAB SAB Ect Mult Living                 Review of Systems  All other systems reviewed and are negative.    Allergies  Review of patient's allergies indicates no known allergies.  Home Medications   Current Outpatient Rx  Name  Route  Sig  Dispense  Refill  . acetaminophen (TYLENOL) 325 MG tablet   Oral   Take 650 mg by mouth every 6 (six) hours as needed for pain.         Marland Kitchen antipyrine-benzocaine (AURALGAN) otic solution   Right Ear   Place 3 drops into the right ear every 2 (two) hours as needed for pain.   10 mL   0   . cetirizine (ZYRTEC) 10 MG tablet      One tab before bedtime for allergies.   30 tablet   2   . clindamycin (CLEOCIN) 300 MG capsule   Oral   Take 1 capsule (300 mg total) by mouth 4 (four) times daily. For 7 days   28 capsule   0   . etonogestrel (IMPLANON) 68 MG IMPL implant  Subcutaneous   Inject 1 each into the skin once.         . fluticasone (FLONASE) 50 MCG/ACT nasal spray   Nasal   Place 2 sprays into the nose daily.   16 g   2   . HYDROcodone-acetaminophen (NORCO/VICODIN) 5-325 MG per tablet      Take one-two tabs po q 4-6 hrs prn pain   20 tablet   0   . megestrol (MEGACE) 40 MG tablet      TAKE 3 TABLETS BY MOUTH EVERY DAY FOR 3 DAYS, 2 TABLETS EVERY DAY FOR 2 DAYS, THEN 1 TABLET EVERY DAY AS NEEDED FOR BLEEDING   60 tablet   0    BP 122/77  Pulse 60  Temp(Src) 98.7 F (37.1 C) (Oral)  Resp 16  Ht 5\' 4"  (1.626 m)  Wt 290 lb (131.543 kg)  BMI 49.75 kg/m2  SpO2 100%  LMP 06/30/2013 Physical Exam  Nursing note and vitals reviewed.  22 year old female, resting comfortably and in no acute distress. Vital signs are normal. Oxygen saturation is 100%, which is normal. Head is normocephalic and atraumatic. PERRLA, EOMI. Oropharynx is clear. Dental caries are noted in tube #32  and that tooth has tenderness to percussion. There is no swelling or edema of the surrounding gingiva. Teeth numbers 16 and 17 are crowded and seemed to be coming in crooked. Tooth #1 has not fully erupted. Neck is nontender and supple without adenopathy or JVD. Back is nontender and there is no CVA tenderness. Lungs are clear without rales, wheezes, or rhonchi. Chest is nontender. Heart has regular rate and rhythm without murmur. Abdomen is soft, flat, nontender without masses or hepatosplenomegaly and peristalsis is normoactive. Extremities have no cyanosis or edema, full range of motion is present. Skin is warm and dry without rash. Neurologic: Mental status is normal, cranial nerves are intact, there are no motor or sensory deficits.  ED Course   Procedures (including critical care time)  1. Dental caries     MDM  Dental caries and pain involving tooth #32. She is referred to her dentist for definitive treatment but she may need referral to an oral  surgeon. Prescriptions are given for amoxicillin and oxycodone-acetaminophen.  Dione Booze, MD 07/06/13 [phone]

## 2013-07-06 NOTE — ED Notes (Signed)
Lower right wisdom tooth pain

## 2013-07-22 MED FILL — Oxycodone w/ Acetaminophen Tab 5-325 MG: ORAL | Qty: 6 | Status: AC

## 2013-09-18 ENCOUNTER — Emergency Department (HOSPITAL_COMMUNITY)
Admission: EM | Admit: 2013-09-18 | Discharge: 2013-09-18 | Disposition: A | Discharge status: Home / Self Care | Payer: Medicaid Other | Attending: Emergency Medicine | Admitting: Emergency Medicine

## 2013-09-18 ENCOUNTER — Emergency Department (HOSPITAL_COMMUNITY): Payer: Medicaid Other

## 2013-09-18 ENCOUNTER — Encounter (HOSPITAL_COMMUNITY): Payer: Self-pay | Admitting: Emergency Medicine

## 2013-09-18 DIAGNOSIS — R062 Wheezing: Secondary | ICD-10-CM | POA: Insufficient documentation

## 2013-09-18 DIAGNOSIS — Z792 Long term (current) use of antibiotics: Secondary | ICD-10-CM | POA: Insufficient documentation

## 2013-09-18 DIAGNOSIS — Z79899 Other long term (current) drug therapy: Secondary | ICD-10-CM | POA: Insufficient documentation

## 2013-09-18 DIAGNOSIS — F17200 Nicotine dependence, unspecified, uncomplicated: Secondary | ICD-10-CM | POA: Insufficient documentation

## 2013-09-18 DIAGNOSIS — J209 Acute bronchitis, unspecified: Secondary | ICD-10-CM | POA: Insufficient documentation

## 2013-09-18 DIAGNOSIS — R000 Tachycardia, unspecified: Secondary | ICD-10-CM | POA: Insufficient documentation

## 2013-09-18 DIAGNOSIS — J40 Bronchitis, not specified as acute or chronic: Secondary | ICD-10-CM

## 2013-09-18 DIAGNOSIS — IMO0002 Reserved for concepts with insufficient information to code with codable children: Secondary | ICD-10-CM | POA: Insufficient documentation

## 2013-09-18 DIAGNOSIS — R0602 Shortness of breath: Secondary | ICD-10-CM | POA: Insufficient documentation

## 2013-09-18 DIAGNOSIS — J9801 Acute bronchospasm: Secondary | ICD-10-CM

## 2013-09-18 MED ORDER — IPRATROPIUM BROMIDE 0.02 % IN SOLN
0.5000 mg | Freq: Once | RESPIRATORY_TRACT | Status: AC
  Administered 2013-09-18: 0.5 mg via RESPIRATORY_TRACT
  Filled 2013-09-18: qty 2.5

## 2013-09-18 MED ORDER — PREDNISONE (PAK) 10 MG PO TABS: 10.0000 mg | ORAL_TABLET | Freq: Every day | ORAL | Status: DC

## 2013-09-18 MED ORDER — ALBUTEROL SULFATE HFA 108 (90 BASE) MCG/ACT IN AERS
2.0000 | INHALATION_SPRAY | RESPIRATORY_TRACT | Status: DC | PRN
  Administered 2013-09-18: 2 via RESPIRATORY_TRACT
  Filled 2013-09-18: qty 6.7

## 2013-09-18 MED ORDER — GUAIFENESIN-CODEINE 100-10 MG/5ML PO SYRP: 5.0000 mL | ORAL_SOLUTION | Freq: Three times a day (TID) | ORAL | Status: DC | PRN

## 2013-09-18 MED ORDER — PREDNISONE 10 MG PO TABS
60.0000 mg | ORAL_TABLET | Freq: Once | ORAL | Status: AC
  Administered 2013-09-18: 60 mg via ORAL
  Filled 2013-09-18 (×2): qty 1

## 2013-09-18 MED ORDER — ALBUTEROL SULFATE (5 MG/ML) 0.5% IN NEBU
2.5000 mg | INHALATION_SOLUTION | Freq: Once | RESPIRATORY_TRACT | Status: AC
  Administered 2013-09-18: 2.5 mg via RESPIRATORY_TRACT
  Filled 2013-09-18: qty 0.5

## 2013-09-18 MED ORDER — AZITHROMYCIN 250 MG PO TABS: ORAL_TABLET | ORAL | Status: DC

## 2013-09-18 NOTE — ED Provider Notes (Signed)
Medical screening examination/treatment/procedure(s) were performed by non-physician practitioner and as supervising physician I was immediately available for consultation/collaboration. Devoria Albe, MD, Armando Gang   Ward Givens, MD 09/18/13 [phone]

## 2013-09-18 NOTE — ED Provider Notes (Signed)
CSN: [id number]     Arrival date & time 09/18/13  1215 History   First MD Initiated Contact with Patient 09/18/13 1234     Chief Complaint  Patient presents with  . Cough  . chest congestion    (Consider location/radiation/quality/duration/timing/severity/associated sxs/prior Treatment) Patient is a 22 y.o. female presenting with cough. The history is provided by the patient.  Cough Cough characteristics:  Productive Sputum characteristics:  Green Severity:  Moderate Onset quality:  Gradual Duration:  16 hours Timing:  Sporadic Progression:  Worsening Chronicity:  New Smoker: yes   Relieved by:  Nothing Worsened by:  Smoking and lying down Ineffective treatments:  None tried Associated symptoms: shortness of breath and wheezing   Associated symptoms: no chest pain, no chills, no ear pain, no eye discharge, no fever, no headaches, no rash, no sinus congestion and no sore throat    Selena Barr is a 22 y.o. female who presents to the ED with cough cold and congestion that started last night. She states that she is worse today with wheezing. She has had bronchitis in the past and used an inhaler. She smokes cigarettes daily.   History reviewed. No pertinent past medical history. History reviewed. No pertinent past surgical history. History reviewed. No pertinent family history. History  Substance Use Topics  . Smoking status: Current Every Day Smoker -- 0.50 packs/day    Types: Cigarettes  . Smokeless tobacco: Not on file  . Alcohol Use: Yes     Comment: occ   OB History   Grav Para Term Preterm Abortions TAB SAB Ect Mult Living                 Review of Systems  Constitutional: Negative for fever and chills.  HENT: Negative for ear pain and sore throat.   Eyes: Negative for discharge.  Respiratory: Positive for cough, shortness of breath and wheezing.   Cardiovascular: Negative for chest pain.  Gastrointestinal: Negative for nausea, vomiting and abdominal pain.   Genitourinary: Negative for dysuria and frequency.  Musculoskeletal: Negative for back pain and neck pain.  Skin: Negative for rash.  Allergic/Immunologic: Negative for immunocompromised state.  Neurological: Negative for dizziness and headaches.  Psychiatric/Behavioral: The patient is not nervous/anxious.     Allergies  Review of patient's allergies indicates no known allergies.  Home Medications   Current Outpatient Rx  Name  Route  Sig  Dispense  Refill  . acetaminophen (TYLENOL) 325 MG tablet   Oral   Take 650 mg by mouth every 6 (six) hours as needed for pain.         Marland Kitchen amoxicillin (AMOXIL) 500 MG capsule   Oral   Take 2 capsules (1,000 mg total) by mouth 2 (two) times daily.   40 capsule   0   . antipyrine-benzocaine (AURALGAN) otic solution   Right Ear   Place 3 drops into the right ear every 2 (two) hours as needed for pain.   10 mL   0   . cetirizine (ZYRTEC) 10 MG tablet      One tab before bedtime for allergies.   30 tablet   2   . clindamycin (CLEOCIN) 300 MG capsule   Oral   Take 1 capsule (300 mg total) by mouth 4 (four) times daily. For 7 days   28 capsule   0   . etonogestrel (IMPLANON) 68 MG IMPL implant   Subcutaneous   Inject 1 each into the skin once.         Marland Kitchen  fluticasone (FLONASE) 50 MCG/ACT nasal spray   Nasal   Place 2 sprays into the nose daily.   16 g   2   . HYDROcodone-acetaminophen (NORCO/VICODIN) 5-325 MG per tablet      Take one-two tabs po q 4-6 hrs prn pain   20 tablet   0   . megestrol (MEGACE) 40 MG tablet      TAKE 3 TABLETS BY MOUTH EVERY DAY FOR 3 DAYS, 2 TABLETS EVERY DAY FOR 2 DAYS, THEN 1 TABLET EVERY DAY AS NEEDED FOR BLEEDING   60 tablet   0   . oxyCODONE-acetaminophen (PERCOCET/ROXICET) 5-325 MG per tablet   Oral   Take 1 tablet by mouth every 4 (four) hours as needed for pain.   15 tablet   0   . oxyCODONE-acetaminophen (PERCOCET/ROXICET) 5-325 MG per tablet   Oral   Take 1 tablet by mouth  every 4 (four) hours as needed for pain.   6 tablet   0    BP 148/82  Pulse 108  Temp(Src) 98.7 F (37.1 C) (Oral)  Resp 24  Ht 5\' 4"  (1.626 m)  Wt 291 lb 3.2 oz (132.087 kg)  BMI 49.96 kg/m2  SpO2 95%  LMP 09/03/2013 Physical Exam  Nursing note and vitals reviewed. Constitutional: She is oriented to person, place, and time. She appears well-developed and well-nourished. No distress.  HENT:  Head: Normocephalic and atraumatic.  Eyes: EOM are normal.  Neck: Neck supple.  Cardiovascular: Tachycardia present.   Pulmonary/Chest: Effort normal. She has decreased breath sounds. She has wheezes.  Abdominal: Soft. There is no tenderness.  Musculoskeletal: Normal range of motion.  Neurological: She is alert and oriented to person, place, and time. No cranial nerve deficit.  Skin: Skin is warm and dry.  Psychiatric: She has a normal mood and affect. Her behavior is normal.    ED Course  Procedures   EKG Interpretation   None      Dg Chest 2 View  09/18/2013   CLINICAL DATA:  Cough and shortness of breath.  EXAM: CHEST  2 VIEW  COMPARISON:  PA and lateral chest 11/09/2004.  FINDINGS: Heart size and mediastinal contours are within normal limits. Both lungs are clear. Visualized skeletal structures are unremarkable.  IMPRESSION: No acute disease.   Electronically Signed   By: Drusilla Kanner M.D.   On: 09/18/2013 14:10    After albuterol/atrovent neb and prednisone PO patient is feeling much better. Re examined and lungs are much improved. Continues to have wheezing but much less.   MDM  21 y.o. female with onset of cough and wheezing last night. Will treat for bronchitis. Encouraged patient to stop smoking.  VS rechecked prior to discharge and 02 SAT 97 % on Room air. Patient stable for discharge home without any immediate complications. She will return for any problems.     Medication List    TAKE these medications       azithromycin 250 MG tablet  Commonly known as:   ZITHROMAX Z-PAK  Take 2 tablets PO today and then one tablet daily until finished for infection     guaiFENesin-codeine 100-10 MG/5ML syrup  Commonly known as:  ROBITUSSIN AC  Take 5 mLs by mouth 3 (three) times daily as needed for cough.     predniSONE 10 MG tablet  Commonly known as:  STERAPRED UNI-PAK  Take 1 tablet (10 mg total) by mouth daily. Starting 09/19/2013 take 5 tablets PO then 4, 3, 2, 1  ASK your doctor about these medications       cetirizine 10 MG tablet  Commonly known as:  ZYRTEC  Take 10 mg by mouth daily.     IMPLANON 68 MG Impl implant  Generic drug:  etonogestrel  Inject 1 each into the skin once.           [facility] Orlene Och, NP 09/18/13 1450

## 2013-09-18 NOTE — ED Notes (Signed)
Chest congestion and cough w/green phlegm began last night.  Slight SOB.  Denies pain and fever.

## 2013-09-18 NOTE — ED Notes (Signed)
Scanner broken. Unable to scan pt's band. Name, DOB and MRN verified.

## 2014-01-07 ENCOUNTER — Encounter: Payer: Medicaid Other | Admitting: Advanced Practice Midwife

## 2014-01-07 ENCOUNTER — Encounter: Payer: Self-pay | Admitting: *Deleted

## 2014-02-02 ENCOUNTER — Emergency Department (HOSPITAL_COMMUNITY)
Admission: EM | Admit: 2014-02-02 | Discharge: 2014-02-02 | Disposition: A | Discharge status: Home / Self Care | Payer: Medicaid Other | Attending: Emergency Medicine | Admitting: Emergency Medicine

## 2014-02-02 ENCOUNTER — Encounter (HOSPITAL_COMMUNITY): Payer: Self-pay | Admitting: Emergency Medicine

## 2014-02-02 DIAGNOSIS — F17200 Nicotine dependence, unspecified, uncomplicated: Secondary | ICD-10-CM | POA: Insufficient documentation

## 2014-02-02 DIAGNOSIS — Z79899 Other long term (current) drug therapy: Secondary | ICD-10-CM | POA: Insufficient documentation

## 2014-02-02 DIAGNOSIS — IMO0002 Reserved for concepts with insufficient information to code with codable children: Secondary | ICD-10-CM | POA: Insufficient documentation

## 2014-02-02 DIAGNOSIS — L02411 Cutaneous abscess of right axilla: Secondary | ICD-10-CM

## 2014-02-02 MED ORDER — LIDOCAINE HCL (PF) 1 % IJ SOLN
INTRAMUSCULAR | Status: AC
  Administered 2014-02-02: 09:00:00
  Filled 2014-02-02: qty 5

## 2014-02-02 MED ORDER — HYDROCODONE-ACETAMINOPHEN 5-325 MG PO TABS: 1.0000 | ORAL_TABLET | ORAL | Status: DC | PRN

## 2014-02-02 NOTE — ED Notes (Signed)
Pt presents with abscess-like area to right axillary area. Pt first noticed area on Saturday. Denies drainage. Tender to touch. Redness and warmth to area.

## 2014-02-02 NOTE — ED Provider Notes (Signed)
Medical screening examination/treatment/procedure(s) were performed by non-physician practitioner and as supervising physician I was immediately available for consultation/collaboration.   EKG Interpretation None       Doug SouSam Kelise Kuch, MD 02/02/14 [phone]

## 2014-02-02 NOTE — ED Notes (Signed)
Patient c.o abscess under right arm that appeared on Saturday. Patient denies any drainage or fevers. Per patient "sore" and tender to touch.

## 2014-02-02 NOTE — Discharge Instructions (Signed)
Abscess Care After An abscess (also called a boil or furuncle) is an infected area that contains a collection of pus. Signs and symptoms of an abscess include pain, tenderness, redness, or hardness, or you may feel a moveable soft area under your skin. An abscess can occur anywhere in the body. The infection may spread to surrounding tissues causing cellulitis. A cut (incision) by the surgeon was made over your abscess and the pus was drained out. Gauze may have been packed into the space to provide a drain that will allow the cavity to heal from the inside outwards. The boil may be painful for 5 to 7 days. Most people with a boil do not have high fevers. Your abscess, if seen early, may not have localized, and may not have been lanced. If not, another appointment may be required for this if it does not get better on its own or with medications. HOME CARE INSTRUCTIONS   Only take over-the-counter or prescription medicines for pain, discomfort, or fever as directed by your caregiver.  Use warm compresses to your abscess site for 15 minutes 3-4 times daily,  Keep covered as it will probably continue to drain for 1-2 days. SEEK IMMEDIATE MEDICAL CARE IF:   You develop increased pain, swelling, redness, drainage, or bleeding in the wound site.  You develop signs of generalized infection including muscle aches, chills, fever, or a general ill feeling.  An oral temperature above 102 F (38.9 C) develops, not controlled by medication. See your caregiver for a recheck if you develop any of the symptoms described above. If medications (antibiotics) were prescribed, take them as directed. Document Released: 06/08/2005 Document Revised: 02/12/2012 Document Reviewed: 02/03/2008 [facility] Patient Information 2014 [employer] [place], [place].

## 2014-02-02 NOTE — ED Provider Notes (Signed)
CSN: [id number]     Arrival date & time 02/02/14  0807 History   First MD Initiated Contact with Patient 02/02/14 [phone]     Chief Complaint  Patient presents with  . Abscess     (Consider location/radiation/quality/duration/timing/severity/associated sxs/prior Treatment) Patient is a 23 y.o. female presenting with abscess. The history is provided by the patient.  Abscess Location:  Shoulder/arm Shoulder/arm abscess location:  R axilla Size:  2 cm Abscess quality: induration and painful   Abscess quality: not draining, no fluctuance and no redness   Red streaking: no   Duration:  2 days Progression:  Worsening Pain details:    Quality:  Pressure and sharp   Severity:  Moderate   Timing:  Constant   Progression:  Worsening Chronicity:  Recurrent Context: not diabetes and not skin injury   Relieved by:  Nothing Ineffective treatments:  Warm compresses and NSAIDs Associated symptoms: no fever, no nausea and no vomiting     History reviewed. No pertinent past medical history. History reviewed. No pertinent past surgical history. Family History  Problem Relation Age of Onset  . Diabetes Mother    History  Substance Use Topics  . Smoking status: Current Every Day Smoker -- 0.50 packs/day for 2 years    Types: Cigarettes  . Smokeless tobacco: Never Used  . Alcohol Use: Yes     Comment: occ   OB History   Grav Para Term Preterm Abortions TAB SAB Ect Mult Living   1 1 1       1      Review of Systems  Constitutional: Negative for fever and chills.  HENT: Negative.   Respiratory: Negative.   Cardiovascular: Negative.   Gastrointestinal: Negative for nausea and vomiting.  Skin: Positive for color change.      Allergies  Review of patient's allergies indicates no known allergies.  Home Medications   Current Outpatient Rx  Name  Route  Sig  Dispense  Refill  . azithromycin (ZITHROMAX Z-PAK) 250 MG tablet      Take 2 tablets PO today and then one tablet daily  until finished for infection   6 tablet   0   . EXPIRED: cetirizine (ZYRTEC) 10 MG tablet   Oral   Take 10 mg by mouth daily.         Marland Kitchen. etonogestrel (IMPLANON) 68 MG IMPL implant   Subcutaneous   Inject 1 each into the skin once.         Marland Kitchen. guaiFENesin-codeine (ROBITUSSIN AC) 100-10 MG/5ML syrup   Oral   Take 5 mLs by mouth 3 (three) times daily as needed for cough.   120 mL   0   . HYDROcodone-acetaminophen (NORCO/VICODIN) 5-325 MG per tablet   Oral   Take 1 tablet by mouth every 4 (four) hours as needed for moderate pain.   15 tablet   0   . predniSONE (STERAPRED UNI-PAK) 10 MG tablet   Oral   Take 1 tablet (10 mg total) by mouth daily. Starting 09/19/2013 take 5 tablets PO then 4, 3, 2, 1   15 tablet   0    BP 120/69  Pulse 74  Temp(Src) 98 F (36.7 C) (Oral)  Ht 5\' 4"  (1.626 m)  Wt 291 lb (131.997 kg)  BMI 49.93 kg/m2  SpO2 98% Physical Exam  Constitutional: She is oriented to person, place, and time. She appears well-developed and well-nourished.  HENT:  Head: Normocephalic.  Cardiovascular: Normal rate.   Pulmonary/Chest: Effort normal.  Neurological: She is alert and oriented to person, place, and time. No sensory deficit.  Skin:  2 cm tender induration right axilla.  No erythema, no red streaking.    ED Course  Procedures (including critical care time)  Informal bedside US confirmed pus pocket.  INCISION AND DRAINAGE Performed by: Burgess Amor Consent: Verbal consent obtained. Risks and benefits: risks, benefits and alternatives were discussed Type: abscess  Body area: right axilla  Anesthesia: local infiltration  Incision was made with a scalpel.  Local anesthetic: lidocaine 1% without epinephrine  Anesthetic total: 4 ml  Complexity: complex Blunt dissection to break up loculations  Drainage: purulent  Drainage amount: moderate  Packing material: no packing. Patient tolerance: Patient tolerated the procedure well with no  immediate complications.    Labs Review Labs Reviewed - No data to display Imaging Review No results found.   EKG Interpretation None      MDM   Final diagnoses:  Abscess of axilla, right    Pt encouraged continued warm compresses and or shower soaks to axilla 3-4 times daily.  Hydrocodone prescribed.  F/u with pcp for recheck for any worsened sx.    Burgess Amor, PA-C 02/02/14 [phone]

## 2014-03-16 ENCOUNTER — Encounter: Payer: Self-pay | Admitting: Family Medicine

## 2014-03-16 ENCOUNTER — Ambulatory Visit (INDEPENDENT_AMBULATORY_CARE_PROVIDER_SITE_OTHER): Payer: Medicaid Other | Admitting: Family Medicine

## 2014-03-16 VITALS — BP 128/80 | HR 84 | Temp 97.4°F | Resp 20 | Ht 63.0 in | Wt 316.2 lb

## 2014-03-16 DIAGNOSIS — J309 Allergic rhinitis, unspecified: Secondary | ICD-10-CM

## 2014-03-16 MED ORDER — CETIRIZINE HCL 10 MG PO TABS: 10.0000 mg | ORAL_TABLET | Freq: Every day | ORAL | Status: DC

## 2014-03-16 MED ORDER — DEXAMETHASONE SODIUM PHOSPHATE 4 MG/ML IJ SOLN
4.0000 mg | Freq: Once | INTRAMUSCULAR | Status: AC
  Administered 2014-03-16: 4 mg via INTRAMUSCULAR

## 2014-03-16 MED ORDER — OLOPATADINE HCL 0.2 % OP SOLN: 1.0000 [drp] | Freq: Every day | OPHTHALMIC | Status: DC | PRN

## 2014-03-16 MED ORDER — FLUTICASONE PROPIONATE 50 MCG/ACT NA SUSP: 2.0000 | Freq: Every day | NASAL | Status: DC

## 2014-03-16 NOTE — Patient Instructions (Signed)
Allergic Rhinitis Allergic rhinitis is when the mucous membranes in the nose respond to allergens. Allergens are particles in the air that cause your body to have an allergic reaction. This causes you to release allergic antibodies. Through a chain of events, these eventually cause you to release histamine into the blood stream. Although meant to protect the body, it is this release of histamine that causes your discomfort, such as frequent sneezing, congestion, and an itchy, runny nose.  CAUSES  Seasonal allergic rhinitis (hay fever) is caused by pollen allergens that may come from grasses, trees, and weeds. Year-round allergic rhinitis (perennial allergic rhinitis) is caused by allergens such as house dust mites, pet dander, and mold spores.  SYMPTOMS   Nasal stuffiness (congestion).  Itchy, runny nose with sneezing and tearing of the eyes. DIAGNOSIS  Your health care provider can help you determine the allergen or allergens that trigger your symptoms. If you and your health care provider are unable to determine the allergen, skin or blood testing may be used. TREATMENT  Allergic Rhinitis does not have a cure, but it can be controlled by:  Medicines and allergy shots (immunotherapy).  Avoiding the allergen. Hay fever may often be treated with antihistamines in pill or nasal spray forms. Antihistamines block the effects of histamine. There are over-the-counter medicines that may help with nasal congestion and swelling around the eyes. Check with your health care provider before taking or giving this medicine.  If avoiding the allergen or the medicine prescribed do not work, there are many new medicines your health care provider can prescribe. Stronger medicine may be used if initial measures are ineffective. Desensitizing injections can be used if medicine and avoidance does not work. Desensitization is when a patient is given ongoing shots until the body becomes less sensitive to the allergen.  Make sure you follow up with your health care provider if problems continue. HOME CARE INSTRUCTIONS It is not possible to completely avoid allergens, but you can reduce your symptoms by taking steps to limit your exposure to them. It helps to know exactly what you are allergic to so that you can avoid your specific triggers. SEEK MEDICAL CARE IF:   You have a fever.  You develop a cough that does not stop easily (persistent).  You have shortness of breath.  You start wheezing.  Symptoms interfere with normal daily activities. Document Released: 08/15/2001 Document Revised: 09/10/2013 Document Reviewed: 07/28/2013 [employer] Patient Information 2014 [employer].  

## 2014-03-16 NOTE — Progress Notes (Signed)
  Subjective:     Selena Barr is a 23 y.o. female who presents for evaluation and treatment of allergic symptoms. Symptoms include: clear rhinorrhea, cough, itchy eyes, postnasal drip, sinus pressure, sneezing, swelling of eyes and watery eyes and are present in a seasonal pattern. Precipitants include: pollen. Treatment currently includes none, she ran out of her eye drops and zyrtec along with the flonase.;  N/A effective because she has been out of her medicines in the last week.  The following portions of the patient's history were reviewed and updated as appropriate: allergies, current medications, past family history, past medical history, past social history, past surgical history and problem list. PMH: Allergic rhinitis Medications: She is out of her zyrtec, eye drops and nose sprays Allergies:NKDA  Review of Systems Pertinent items are noted in HPI.    Objective:    BP 128/80  Pulse 84  Temp(Src) 97.4 F (36.3 C) (Temporal)  Resp 20  Ht 5\' 3"  (1.6 m)  Wt 316 lb 3.2 oz (143.427 kg)  BMI 56.03 kg/m2  SpO2 97% General appearance: alert, cooperative, appears stated age and no distress Head: Normocephalic, without obvious abnormality, atraumatic, sinuses nontender to percussion Eyes: positive findings: conjunctiva: 1+ injection Ears: normal TM's and external ear canals both ears Nose: turbinates pink, swollen Throat: lips, mucosa, and tongue normal; teeth and gums normal Lungs: clear to auscultation bilaterally and normal percussion bilaterally Heart: regular rate and rhythm and S1, S2 normal    Assessment:    Allergic rhinitis.    Shannan was seen today for nasal congestion, eye drainage and medication refill.  Diagnoses and associated orders for this visit:  Allergic rhinitis - dexamethasone (DECADRON) injection 4 mg; Inject 1 mL (4 mg total) into the muscle once.  Other Orders - Olopatadine HCl 0.2 % SOLN; Place 1 drop into both eyes daily as  needed. - cetirizine (ZYRTEC) 10 MG tablet; Take 1 tablet (10 mg total) by mouth at bedtime. - fluticasone (FLONASE) 50 MCG/ACT nasal spray; Place 2 sprays into both nostrils daily.    Plan:    Medications: intranasal steroids: Flonase rx given today, oral antihistamines: zyrtec rx given, intramuscular steroids: decadron 4 mg IM injection given today while in clinic, eye drops:  pataday to be done 1 drop in each eye as needed for allergies.. Allergen avoidance discussed. Follow-up in 1 week if no better after medications.

## 2014-05-26 ENCOUNTER — Other Ambulatory Visit: Payer: Self-pay | Admitting: Advanced Practice Midwife

## 2014-08-28 ENCOUNTER — Emergency Department (HOSPITAL_COMMUNITY)
Admission: EM | Admit: 2014-08-28 | Discharge: 2014-08-28 | Disposition: A | Discharge status: Home / Self Care | Payer: No Typology Code available for payment source | Attending: Emergency Medicine | Admitting: Emergency Medicine

## 2014-08-28 ENCOUNTER — Emergency Department (HOSPITAL_COMMUNITY): Payer: No Typology Code available for payment source

## 2014-08-28 ENCOUNTER — Encounter (HOSPITAL_COMMUNITY): Payer: Self-pay | Admitting: Emergency Medicine

## 2014-08-28 DIAGNOSIS — F17200 Nicotine dependence, unspecified, uncomplicated: Secondary | ICD-10-CM | POA: Diagnosis not present

## 2014-08-28 DIAGNOSIS — IMO0002 Reserved for concepts with insufficient information to code with codable children: Secondary | ICD-10-CM | POA: Diagnosis not present

## 2014-08-28 DIAGNOSIS — Z79899 Other long term (current) drug therapy: Secondary | ICD-10-CM | POA: Insufficient documentation

## 2014-08-28 DIAGNOSIS — T07XXXA Unspecified multiple injuries, initial encounter: Secondary | ICD-10-CM

## 2014-08-28 DIAGNOSIS — Y92410 Unspecified street and highway as the place of occurrence of the external cause: Secondary | ICD-10-CM | POA: Diagnosis not present

## 2014-08-28 DIAGNOSIS — Y9389 Activity, other specified: Secondary | ICD-10-CM | POA: Diagnosis not present

## 2014-08-28 MED ORDER — HYDROCODONE-ACETAMINOPHEN 5-325 MG PO TABS: 1.0000 | ORAL_TABLET | ORAL | Status: DC | PRN

## 2014-08-28 MED ORDER — HYDROCODONE-ACETAMINOPHEN 5-325 MG PO TABS
1.0000 | ORAL_TABLET | Freq: Once | ORAL | Status: AC
  Administered 2014-08-28: 1 via ORAL
  Filled 2014-08-28: qty 1

## 2014-08-28 MED ORDER — DIAZEPAM 5 MG PO TABS
5.0000 mg | ORAL_TABLET | Freq: Once | ORAL | Status: AC
  Administered 2014-08-28: 5 mg via ORAL
  Filled 2014-08-28: qty 1

## 2014-08-28 MED ORDER — IBUPROFEN 800 MG PO TABS
800.0000 mg | ORAL_TABLET | Freq: Once | ORAL | Status: AC
  Administered 2014-08-28: 800 mg via ORAL
  Filled 2014-08-28: qty 1

## 2014-08-28 MED ORDER — METHOCARBAMOL 500 MG PO TABS: 500.0000 mg | ORAL_TABLET | Freq: Three times a day (TID) | ORAL | Status: DC
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
## 2014-08-29 NOTE — ED Provider Notes (Signed)
Medical screening examination/treatment/procedure(s) were performed by non-physician practitioner and as supervising physician I was immediately available for consultation/collaboration.   EKG Interpretation None       Donnetta Hutching, MD 08/29/14 2016

## 2014-08-30 NOTE — ED Provider Notes (Signed)
Medical screening examination/treatment/procedure(s) were performed by non-physician practitioner and as supervising physician I was immediately available for consultation/collaboration.   EKG Interpretation None       Donnetta Hutching, MD 08/30/14 1642

## 2014-09-15 ENCOUNTER — Encounter (HOSPITAL_COMMUNITY): Payer: Self-pay | Admitting: Emergency Medicine

## 2014-09-15 ENCOUNTER — Emergency Department (HOSPITAL_COMMUNITY): Payer: Medicaid Other

## 2014-09-15 ENCOUNTER — Emergency Department (HOSPITAL_COMMUNITY)
Admission: EM | Admit: 2014-09-15 | Discharge: 2014-09-15 | Disposition: A | Discharge status: Home / Self Care | Payer: Medicaid Other | Attending: Emergency Medicine | Admitting: Emergency Medicine

## 2014-09-15 DIAGNOSIS — Z79899 Other long term (current) drug therapy: Secondary | ICD-10-CM | POA: Insufficient documentation

## 2014-09-15 DIAGNOSIS — Z720 Tobacco use: Secondary | ICD-10-CM | POA: Insufficient documentation

## 2014-09-15 DIAGNOSIS — J209 Acute bronchitis, unspecified: Secondary | ICD-10-CM

## 2014-09-15 DIAGNOSIS — J45901 Unspecified asthma with (acute) exacerbation: Secondary | ICD-10-CM | POA: Insufficient documentation

## 2014-09-15 MED ORDER — PREDNISONE 50 MG PO TABS
60.0000 mg | ORAL_TABLET | Freq: Once | ORAL | Status: AC
  Administered 2014-09-15: 60 mg via ORAL
  Filled 2014-09-15 (×2): qty 1

## 2014-09-15 MED ORDER — ALBUTEROL SULFATE (2.5 MG/3ML) 0.083% IN NEBU
5.0000 mg | INHALATION_SOLUTION | Freq: Once | RESPIRATORY_TRACT | Status: AC
  Administered 2014-09-15: 5 mg via RESPIRATORY_TRACT
  Filled 2014-09-15: qty 6

## 2014-09-15 MED ORDER — ALBUTEROL SULFATE (2.5 MG/3ML) 0.083% IN NEBU
2.5000 mg | INHALATION_SOLUTION | Freq: Once | RESPIRATORY_TRACT | Status: AC
  Administered 2014-09-15: 2.5 mg via RESPIRATORY_TRACT
  Filled 2014-09-15: qty 3

## 2014-09-15 MED ORDER — ALBUTEROL SULFATE HFA 108 (90 BASE) MCG/ACT IN AERS: 1.0000 | INHALATION_SPRAY | Freq: Four times a day (QID) | RESPIRATORY_TRACT | Status: DC | PRN

## 2014-09-15 MED ORDER — PROMETHAZINE-CODEINE 6.25-10 MG/5ML PO SYRP: 5.0000 mL | ORAL_SOLUTION | ORAL | Status: DC | PRN

## 2014-09-15 MED ORDER — IPRATROPIUM-ALBUTEROL 0.5-2.5 (3) MG/3ML IN SOLN
3.0000 mL | RESPIRATORY_TRACT | Status: DC
  Administered 2014-09-15: 3 mL via RESPIRATORY_TRACT
  Filled 2014-09-15: qty 3

## 2014-09-15 MED ORDER — PREDNISONE 50 MG PO TABS: ORAL_TABLET | ORAL | Status: DC

## 2014-09-15 MED ORDER — HYDROCOD POLST-CHLORPHEN POLST 10-8 MG/5ML PO LQCR
5.0000 mL | Freq: Once | ORAL | Status: AC
  Administered 2014-09-15: 5 mL via ORAL
  Filled 2014-09-15: qty 5

## 2014-09-15 NOTE — ED Notes (Signed)
Started on Sunday with cough , congestion and slight sob. Denies fever. Thick yellow /greenish phlegm

## 2014-09-15 NOTE — Discharge Instructions (Signed)

## 2014-09-15 NOTE — ED Provider Notes (Signed)
CSN: [id number]     Arrival date & time 09/15/14  0940 History   First MD Initiated Contact with Patient 09/15/14 [phone]     Chief Complaint  Patient presents with  . URI     (Consider location/radiation/quality/duration/timing/severity/associated sxs/prior Treatment) The history is provided by the patient.   Selena Barr is a 23 y.o. female with a history significant for childhood asthma presenting with a two-day history of cough, nasal and chest congestion with cough productive of yellow to green sputum.  She denies hemoptysis.  She has had increased wheezing with mild wheezing.  She no longer has an albuterol inhaler for treatment of her episodes of wheezing.  She is taking no medications prior to arrival for her symptoms.  She denies nausea or vomiting, no abdominal pain.  She does have mid sternal burning pain with cough only.  She is an everyday smoker.  History reviewed. No pertinent past medical history. History reviewed. No pertinent past surgical history. Family History  Problem Relation Age of Onset  . Diabetes Mother    History  Substance Use Topics  . Smoking status: Current Every Day Smoker -- 0.50 packs/day for 2 years    Types: Cigarettes  . Smokeless tobacco: Never Used  . Alcohol Use: Yes     Comment: occ   OB History   Grav Para Term Preterm Abortions TAB SAB Ect Mult Living   1 1 1       1      Review of Systems  Constitutional: Negative for fever and chills.  HENT: Positive for congestion and rhinorrhea. Negative for ear pain, sinus pressure, sore throat, trouble swallowing and voice change.   Eyes: Negative for discharge.  Respiratory: Positive for cough, chest tightness, shortness of breath and wheezing. Negative for stridor.   Cardiovascular: Positive for chest pain. Negative for palpitations and leg swelling.  Gastrointestinal: Negative for abdominal pain.  Genitourinary: Negative.       Allergies  Review of patient's allergies indicates no  known allergies.  Home Medications   Prior to Admission medications   Medication Sig Start Date End Date Taking? Authorizing Provider  etonogestrel (IMPLANON) 68 MG IMPL implant Inject 1 each into the skin once.   Yes Historical Provider, MD  albuterol (PROVENTIL HFA;VENTOLIN HFA) 108 (90 BASE) MCG/ACT inhaler Inhale 1-2 puffs into the lungs every 6 (six) hours as needed for wheezing or shortness of breath. 09/15/14   Burgess AmorJulie Nikodem Leadbetter, PA-C  predniSONE (DELTASONE) 50 MG tablet 1 tab PO daily starting 09/16/14 09/16/14   Burgess AmorJulie Leialoha Hanna, PA-C  promethazine-codeine (PHENERGAN WITH CODEINE) 6.25-10 MG/5ML syrup Take 5 mLs by mouth every 4 (four) hours as needed for cough. 09/15/14   Burgess AmorJulie Lynise Porr, PA-C   BP 115/73  Pulse 84  Temp(Src) 98.1 F (36.7 C) (Oral)  Resp 20  Ht 5\' 5"  (1.651 m)  Wt 316 lb (143.337 kg)  BMI 52.59 kg/m2  SpO2 98% Physical Exam  Constitutional: She is oriented to person, place, and time. She appears well-developed and well-nourished.  HENT:  Head: Normocephalic and atraumatic.  Right Ear: Tympanic membrane and ear canal normal.  Left Ear: Tympanic membrane and ear canal normal.  Nose: No mucosal edema or rhinorrhea.  Mouth/Throat: Uvula is midline, oropharynx is clear and moist and mucous membranes are normal. No oropharyngeal exudate, posterior oropharyngeal edema, posterior oropharyngeal erythema or tonsillar abscesses.  Eyes: Conjunctivae are normal.  Neck: Neck supple.  Cardiovascular: Normal rate, regular rhythm and normal heart sounds.   Pulmonary/Chest:  Effort normal. No respiratory distress. She has decreased breath sounds. She has wheezes. She has no rhonchi. She has no rales.  Expiratory wheeze throughout all lung fields, right greater than left.  Abdominal: Soft. There is no tenderness.  Musculoskeletal: Normal range of motion.  Neurological: She is alert and oriented to person, place, and time.  Skin: Skin is warm and dry. No rash noted.  Psychiatric: She has a  normal mood and affect.    ED Course  Procedures (including critical care time) Labs Review Labs Reviewed - No data to display  Imaging Review Dg Chest 2 View  09/15/2014   CLINICAL DATA:  23 year old female with productive cough  EXAM: CHEST  2 VIEW  COMPARISON:  Prior chest x-ray 09/18/2013  FINDINGS: The lungs are clear and negative for focal airspace consolidation, pulmonary edema or suspicious pulmonary nodule. No pleural effusion or pneumothorax. Cardiac and mediastinal contours are within normal limits. No acute fracture or lytic or blastic osseous lesions. The visualized upper abdominal bowel gas pattern is unremarkable.  IMPRESSION: No active cardiopulmonary disease.   Electronically Signed   By: Malachy MoanHeath  McCullough M.D.   On: 09/15/2014 11:25     EKG Interpretation None      Medications  albuterol (PROVENTIL) (2.5 MG/3ML) 0.083% nebulizer solution 2.5 mg (2.5 mg Nebulization Given 09/15/14 1050)  chlorpheniramine-HYDROcodone (TUSSIONEX) 10-8 MG/5ML suspension 5 mL (5 mLs Oral Given 09/15/14 1035)  albuterol (PROVENTIL) (2.5 MG/3ML) 0.083% nebulizer solution 5 mg (5 mg Nebulization Given 09/15/14 1136)  predniSONE (DELTASONE) tablet 60 mg (60 mg Oral Given 09/15/14 1137)    MDM   Final diagnoses:  Bronchitis with bronchospasm    Patient was given albuterol and Atrovent nebulizer with increased wheezing although better aeration.  Repeated albuterol 5 mg neb x1 with complete resolution of wheezing.  She is also started on prednisone 60 mg tablet given here.  Chest x-ray reviewed and no infectious process.  She was encouraged rest, and Chris fluid intake.  She was given a prescription for prednisone pulse dosing and a albuterol inhaler.  Phenergan With Codeine cough syrup when necessary cough symptoms.  Plan followup with PCP if not improving, return here for any worsened symptoms.    Burgess AmorJulie Mikaylee Arseneau, PA-C 09/15/14 1223

## 2014-09-15 NOTE — ED Provider Notes (Signed)
Medical screening examination/treatment/procedure(s) were performed by non-physician practitioner and as supervising physician I was immediately available for consultation/collaboration.   EKG Interpretation None        Vihana Kydd N Johnny Gorter, DO 09/15/14 1538 

## 2014-10-05 ENCOUNTER — Encounter (HOSPITAL_COMMUNITY): Payer: Self-pay | Admitting: Emergency Medicine

## 2014-10-07 ENCOUNTER — Encounter: Payer: Medicaid Other | Admitting: Advanced Practice Midwife

## 2014-10-11 ENCOUNTER — Encounter (HOSPITAL_COMMUNITY): Payer: Self-pay | Admitting: Emergency Medicine

## 2014-10-11 ENCOUNTER — Emergency Department (HOSPITAL_COMMUNITY)
Admission: EM | Admit: 2014-10-11 | Discharge: 2014-10-11 | Disposition: A | Discharge status: Home / Self Care | Payer: Self-pay | Attending: Emergency Medicine | Admitting: Emergency Medicine

## 2014-10-11 DIAGNOSIS — Z79899 Other long term (current) drug therapy: Secondary | ICD-10-CM | POA: Insufficient documentation

## 2014-10-11 DIAGNOSIS — Z720 Tobacco use: Secondary | ICD-10-CM | POA: Insufficient documentation

## 2014-10-11 DIAGNOSIS — H109 Unspecified conjunctivitis: Secondary | ICD-10-CM | POA: Insufficient documentation

## 2014-10-11 MED ORDER — TETRACAINE HCL 0.5 % OP SOLN
2.0000 [drp] | Freq: Once | OPHTHALMIC | Status: AC
  Administered 2014-10-11: 2 [drp] via OPHTHALMIC
  Filled 2014-10-11: qty 2

## 2014-10-11 MED ORDER — FLUORESCEIN SODIUM 1 MG OP STRP
1.0000 | ORAL_STRIP | Freq: Once | OPHTHALMIC | Status: AC
  Administered 2014-10-11: 1 via OPHTHALMIC
  Filled 2014-10-11: qty 1

## 2014-10-11 MED ORDER — TOBRAMYCIN 0.3 % OP SOLN
1.0000 [drp] | Freq: Once | OPHTHALMIC | Status: AC
  Administered 2014-10-11: 1 [drp] via OPHTHALMIC
  Filled 2014-10-11: qty 5

## 2014-10-11 MED ORDER — KETOROLAC TROMETHAMINE 0.5 % OP SOLN
1.0000 [drp] | Freq: Once | OPHTHALMIC | Status: AC
  Administered 2014-10-11: 1 [drp] via OPHTHALMIC
  Filled 2014-10-11: qty 5

## 2014-10-11 NOTE — ED Provider Notes (Signed)
CSN: [id number]     Arrival date & time 10/11/14  1136 History  This chart was scribed for a non-physician practitioner, Pauline Ausammy Emmanuel Ercole, PA-C, working with Rolland PorterMark James, MD by Julian HyMorgan Graham, ED Scribe. The patient was seen in [facility]. The patient's care was started at 12:58 PM.   Chief Complaint  Patient presents with  . Eye Pain    The history is provided by the patient. No language interpreter was used.   HPI Comments: Selena Mingleiffany C Barr is a 23 y.o. female who presents to the Emergency Department complaining of new, moderate, gradually worsening right eye pain onset one day ago. Pt denies trauma or injury to the eye. Pt has associated redness, sensitivity to bright light, discharge, itching and crusting of the right eye. Pt denies it feels like she has a scratch on her right eye. Pt denies any pain or redness in the left eye. Pt has sick contact of her cousin who had pink eye last week. Pt denies wearing contacts. Pt denies fevers, sore throat, headaches, dizziness.   History reviewed. No pertinent past medical history. History reviewed. No pertinent past surgical history. Family History  Problem Relation Age of Onset  . Diabetes Mother    History  Substance Use Topics  . Smoking status: Current Every Day Smoker -- 0.50 packs/day for 2 years    Types: Cigarettes  . Smokeless tobacco: Never Used  . Alcohol Use: Yes     Comment: occ   OB History    Gravida Para Term Preterm AB TAB SAB Ectopic Multiple Living   1 1 1       1      Review of Systems  Constitutional: Negative for fever and chills.  HENT: Negative for congestion, facial swelling and trouble swallowing.   Eyes: Positive for photophobia, pain, discharge, redness, itching and visual disturbance.  Respiratory: Negative for chest tightness and shortness of breath.   Gastrointestinal: Negative for nausea and vomiting.  Neurological: Negative for dizziness, syncope, weakness, numbness and headaches.  All other systems  reviewed and are negative.     Allergies  Review of patient's allergies indicates no known allergies.  Home Medications   Prior to Admission medications   Medication Sig Start Date End Date Taking? Authorizing Provider  albuterol (PROVENTIL HFA;VENTOLIN HFA) 108 (90 BASE) MCG/ACT inhaler Inhale 1-2 puffs into the lungs every 6 (six) hours as needed for wheezing or shortness of breath. 09/15/14   Burgess AmorJulie Idol, PA-C  etonogestrel (IMPLANON) 68 MG IMPL implant Inject 1 each into the skin once.    Historical Provider, MD  predniSONE (DELTASONE) 50 MG tablet 1 tab PO daily starting 09/16/14 Patient not taking: Reported on 10/11/2014 09/16/14   Burgess AmorJulie Idol, PA-C  promethazine-codeine (PHENERGAN WITH CODEINE) 6.25-10 MG/5ML syrup Take 5 mLs by mouth every 4 (four) hours as needed for cough. Patient not taking: Reported on 10/11/2014 09/15/14   Burgess AmorJulie Idol, PA-C   Traige Vitals": BP 126/77 mmHg  Pulse 86  Temp(Src) 98.2 F (36.8 C) (Oral)  Resp 17  Ht 5\' 4"  (1.626 m)  Wt 325 lb 3.2 oz (147.51 kg)  BMI 55.79 kg/m2  SpO2 100%  Physical Exam  Constitutional: She is oriented to person, place, and time. She appears well-developed and well-nourished. No distress.  HENT:  Head: Atraumatic.  Eyes: EOM are normal. Pupils are equal, round, and reactive to light. Lids are everted and swept, no foreign bodies found. Right eye exhibits exudate. Right eye exhibits no chemosis and no hordeolum. No  foreign body present in the right eye. Right conjunctiva is injected. Right conjunctiva has no hemorrhage.  Slit lamp exam:      The right eye shows no corneal abrasion, no corneal flare, no corneal ulcer and no fluorescein uptake.  Conjunctiva is injected on the right, slight exudate present.   Neck: Normal range of motion. Neck supple. No tracheal deviation present.  Cardiovascular: Normal rate, regular rhythm, normal heart sounds and intact distal pulses.   No murmur heard. Pulmonary/Chest: Effort normal and  breath sounds normal. No respiratory distress.  Musculoskeletal: Normal range of motion.  Lymphadenopathy:       Head (right side): No preauricular adenopathy present.       Head (left side): No preauricular adenopathy present.    She has no cervical adenopathy.  Neurological: She is alert and oriented to person, place, and time. She exhibits normal muscle tone. Coordination normal.  Skin: Skin is warm and dry.  Psychiatric: She has a normal mood and affect. Her behavior is normal.  Nursing note and vitals reviewed.   ED Course  Procedures (including critical care time) DIAGNOSTIC STUDIES: Oxygen Saturation is 100% on RA, normal by my interpretation.    COORDINATION OF CARE: 1:00 PM- Patient informed of current plan for treatment and evaluation and agrees with plan at this time.  Labs Review Labs Reviewed - No data to display  Imaging Review No results found.   EKG Interpretation None      MDM   Final diagnoses:  Conjunctivitis of right eye    Pt with conjunctivitis, no evidence of FB, or penetrating injury.  Tobramycin and ketorolac drops dispensed.  Advised to use warm compresses and lubricating drops if needed.      Visual Acuity  Right Eye Distance: 20/30 Left Eye Distance: 20/25 Bilateral Distance: 20/25  I personally performed the services described in this documentation, which was scribed in my presence. The recorded information has been reviewed and is accurate.     Maurie Musco L. Trisha Mangleriplett, PA-C 10/13/14 1205  Rolland PorterMark James, MD 10/24/14 2308

## 2014-10-11 NOTE — Discharge Instructions (Signed)

## 2014-10-11 NOTE — ED Notes (Addendum)
Pt reports right eye pain and drainage since last night. Pt conjunctiva red. Pt denies any recent injury. nad noted.

## 2014-10-13 ENCOUNTER — Emergency Department (HOSPITAL_COMMUNITY)
Admission: EM | Admit: 2014-10-13 | Discharge: 2014-10-13 | Disposition: A | Discharge status: Home / Self Care | Payer: Self-pay | Attending: Emergency Medicine | Admitting: Emergency Medicine

## 2014-10-13 ENCOUNTER — Encounter (HOSPITAL_COMMUNITY): Payer: Self-pay | Admitting: Emergency Medicine

## 2014-10-13 DIAGNOSIS — J069 Acute upper respiratory infection, unspecified: Secondary | ICD-10-CM | POA: Insufficient documentation

## 2014-10-13 DIAGNOSIS — Z720 Tobacco use: Secondary | ICD-10-CM | POA: Insufficient documentation

## 2014-10-13 MED ORDER — ACETAMINOPHEN-CODEINE 120-12 MG/5ML PO SUSP: 5.0000 mL | Freq: Four times a day (QID) | ORAL | Status: DC | PRN

## 2014-10-13 NOTE — Discharge Instructions (Signed)
Upper Respiratory Infection, Adult An upper respiratory infection (URI) is also sometimes known as the common cold. The upper respiratory tract includes the nose, sinuses, throat, trachea, and bronchi. Bronchi are the airways leading to the lungs. Most people improve within 1 week, but symptoms can last up to 2 weeks. A residual cough may last even longer.  CAUSES Many different viruses can infect the tissues lining the upper respiratory tract. The tissues become irritated and inflamed and often become very moist. Mucus production is also common. A cold is contagious. You can easily spread the virus to others by oral contact. This includes kissing, sharing a glass, coughing, or sneezing. Touching your mouth or nose and then touching a surface, which is then touched by another person, can also spread the virus. SYMPTOMS  Symptoms typically develop 1 to 3 days after you come in contact with a cold virus. Symptoms vary from person to person. They may include:  Runny nose.  Sneezing.  Nasal congestion.  Sinus irritation.  Sore throat.  Loss of voice (laryngitis).  Cough.  Fatigue.  Muscle aches.  Loss of appetite.  Headache.  Low-grade fever. DIAGNOSIS  You might diagnose your own cold based on familiar symptoms, since most people get a cold 2 to 3 times a year. Your caregiver can confirm this based on your exam. Most importantly, your caregiver can check that your symptoms are not due to another disease such as strep throat, sinusitis, pneumonia, asthma, or epiglottitis. Blood tests, throat tests, and X-rays are not necessary to diagnose a common cold, but they may sometimes be helpful in excluding other more serious diseases. Your caregiver will decide if any further tests are required. RISKS AND COMPLICATIONS  You may be at risk for a more severe case of the common cold if you smoke cigarettes, have chronic heart disease (such as heart failure) or lung disease (such as asthma), or if  you have a weakened immune system. The very young and very old are also at risk for more serious infections. Bacterial sinusitis, middle ear infections, and bacterial pneumonia can complicate the common cold. The common cold can worsen asthma and chronic obstructive pulmonary disease (COPD). Sometimes, these complications can require emergency medical care and may be life-threatening. PREVENTION  The best way to protect against getting a cold is to practice good hygiene. Avoid oral or hand contact with people with cold symptoms. Wash your hands often if contact occurs. There is no clear evidence that vitamin C, vitamin E, echinacea, or exercise reduces the chance of developing a cold. However, it is always recommended to get plenty of rest and practice good nutrition. TREATMENT  Treatment is directed at relieving symptoms. There is no cure. Antibiotics are not effective, because the infection is caused by a virus, not by bacteria. Treatment may include:  Increased fluid intake. Sports drinks offer valuable electrolytes, sugars, and fluids.  Breathing heated mist or steam (vaporizer or shower).  Eating chicken soup or other clear broths, and maintaining good nutrition.  Getting plenty of rest.  Using gargles or lozenges for comfort.  Controlling fevers with ibuprofen or acetaminophen as directed by your caregiver.  Increasing usage of your inhaler if you have asthma. Zinc gel and zinc lozenges, taken in the first 24 hours of the common cold, can shorten the duration and lessen the severity of symptoms. Pain medicines may help with fever, muscle aches, and throat pain. A variety of non-prescription medicines are available to treat congestion and runny nose. Your caregiver   can make recommendations and may suggest nasal or lung inhalers for other symptoms.  HOME CARE INSTRUCTIONS   Only take over-the-counter or prescription medicines for pain, discomfort, or fever as directed by your  caregiver.  Use a warm mist humidifier or inhale steam from a shower to increase air moisture. This may keep secretions moist and make it easier to breathe.  Drink enough water and fluids to keep your urine clear or pale yellow.  Rest as needed.  Return to work when your temperature has returned to normal or as your caregiver advises. You may need to stay home longer to avoid infecting others. You can also use a face mask and careful hand washing to prevent spread of the virus. SEEK MEDICAL CARE IF:   After the first few days, you feel you are getting worse rather than better.  You need your caregiver's advice about medicines to control symptoms.  You develop chills, worsening shortness of breath, or brown or red sputum. These may be signs of pneumonia.  You develop yellow or brown nasal discharge or pain in the face, especially when you bend forward. These may be signs of sinusitis.  You develop a fever, swollen neck glands, pain with swallowing, or white areas in the back of your throat. These may be signs of strep throat. SEEK IMMEDIATE MEDICAL CARE IF:   You have a fever.  You develop severe or persistent headache, ear pain, sinus pain, or chest pain.  You develop wheezing, a prolonged cough, cough up blood, or have a change in your usual mucus (if you have chronic lung disease).  You develop sore muscles or a stiff neck. Document Released: 05/16/2001 Document Revised: 02/12/2012 Document Reviewed: 02/25/2014 [employer] Patient Information 2015 [employer]. This information is not intended to replace advice given to you by your health care provider. Make sure you discuss any questions you have with your health care provider.  

## 2014-10-13 NOTE — ED Provider Notes (Signed)
CSN: [id number]     Arrival date & time 10/13/14  0801 History   First MD Initiated Contact with Patient 10/13/14 [phone]     Chief Complaint  Patient presents with  . Cough  . Sore Throat     (Consider location/radiation/quality/duration/timing/severity/associated sxs/prior Treatment) HPI Comments: Pt comes in today with productive cough and sore throat since yesterday. No fever. Has tried otc medications without relief. Was seen a couple of days ago for conjunctivitis. Denies pain. No sob. No known exposures  The history is provided by the patient. No language interpreter was used.    History reviewed. No pertinent past medical history. History reviewed. No pertinent past surgical history. Family History  Problem Relation Age of Onset  . Diabetes Mother    History  Substance Use Topics  . Smoking status: Current Every Day Smoker -- 0.50 packs/day for 2 years    Types: Cigarettes  . Smokeless tobacco: Never Used  . Alcohol Use: Yes     Comment: occ   OB History    Gravida Para Term Preterm AB TAB SAB Ectopic Multiple Living   1 1 1       1      Review of Systems  All other systems reviewed and are negative.     Allergies  Review of patient's allergies indicates no known allergies.  Home Medications   Prior to Admission medications   Medication Sig Start Date End Date Taking? Authorizing Provider  acetaminophen-codeine 120-12 MG/5ML suspension Take 5 mLs by mouth every 6 (six) hours as needed for pain. 10/13/14   Teressa LowerVrinda Bradd Merlos, NP  albuterol (PROVENTIL HFA;VENTOLIN HFA) 108 (90 BASE) MCG/ACT inhaler Inhale 1-2 puffs into the lungs every 6 (six) hours as needed for wheezing or shortness of breath. 09/15/14   Burgess AmorJulie Idol, PA-C  etonogestrel (IMPLANON) 68 MG IMPL implant Inject 1 each into the skin once.    Historical Provider, MD   BP 117/77 mmHg  Pulse 92  Temp(Src) 98.4 F (36.9 C) (Oral)  Resp 18  Ht 5\' 4"  (1.626 m)  Wt 325 lb (147.419 kg)  BMI 55.76 kg/m2   SpO2 97% Physical Exam  Constitutional: She appears well-developed and well-nourished.  HENT:  Right Ear: External ear normal.  Left Ear: External ear normal.  Nose: Rhinorrhea present.  Mouth/Throat: Posterior oropharyngeal erythema present.  Eyes: Conjunctivae and EOM are normal. Pupils are equal, round, and reactive to light.  Cardiovascular: Normal rate and regular rhythm.   Pulmonary/Chest: Effort normal and breath sounds normal.  Nursing note and vitals reviewed.   ED Course  Procedures (including critical care time) Labs Review Labs Reviewed - No data to display  Imaging Review No results found.   EKG Interpretation None      MDM   Final diagnoses:  URI (upper respiratory infection)    Don't think antibiotics needed at this time. Will treat symptomatically with tylenol with codeine.    Teressa LowerVrinda Daryon Remmert, NP 10/13/14 [phone]  Lyanne CoKevin M Campos, MD 10/13/14 1539

## 2014-10-13 NOTE — ED Notes (Signed)
Patient c/o sore throat with productive cough since Sunday. Patient reports thick yellow sputum. Per patient short of breath after "coughing spells." Unsure of fevers but believs she had one last night.

## 2014-10-24 ENCOUNTER — Encounter (HOSPITAL_COMMUNITY): Payer: Self-pay | Admitting: Emergency Medicine

## 2014-10-24 ENCOUNTER — Emergency Department (HOSPITAL_COMMUNITY)
Admission: EM | Admit: 2014-10-24 | Discharge: 2014-10-24 | Disposition: A | Discharge status: Home / Self Care | Payer: Self-pay | Attending: Emergency Medicine | Admitting: Emergency Medicine

## 2014-10-24 DIAGNOSIS — L03116 Cellulitis of left lower limb: Secondary | ICD-10-CM | POA: Insufficient documentation

## 2014-10-24 DIAGNOSIS — Z720 Tobacco use: Secondary | ICD-10-CM | POA: Insufficient documentation

## 2014-10-24 MED ORDER — HYDROCODONE-ACETAMINOPHEN 5-325 MG PO TABS
1.0000 | ORAL_TABLET | Freq: Once | ORAL | Status: AC
  Administered 2014-10-24: 1 via ORAL
  Filled 2014-10-24: qty 1

## 2014-10-24 MED ORDER — HYDROCODONE-ACETAMINOPHEN 5-325 MG PO TABS: ORAL_TABLET | ORAL | Status: DC

## 2014-10-24 MED ORDER — SULFAMETHOXAZOLE-TRIMETHOPRIM 800-160 MG PO TABS: 1.0000 | ORAL_TABLET | Freq: Two times a day (BID) | ORAL | Status: DC

## 2014-10-24 MED ORDER — SULFAMETHOXAZOLE-TRIMETHOPRIM 800-160 MG PO TABS
1.0000 | ORAL_TABLET | Freq: Once | ORAL | Status: AC
  Administered 2014-10-24: 1 via ORAL
  Filled 2014-10-24: qty 1

## 2014-10-24 NOTE — ED Notes (Signed)
Pt verbalized understanding of no driving and to use caution within 4 hours of taking pain meds due to meds cause drowsiness 

## 2014-10-24 NOTE — Discharge Instructions (Signed)

## 2014-10-24 NOTE — ED Provider Notes (Signed)
CSN: [id number]     Arrival date & time 10/24/14  1429 History   None    Chief Complaint  Patient presents with  . Insect Bite     (Consider location/radiation/quality/duration/timing/severity/associated sxs/prior Treatment) HPI  Selena Barr is a 23 y.o. female who presents to the Emergency Department complaining of redness and pain to her left inner thigh that began 2 days ago.  She states the area began as a small red "knot" that she thought was a spider bite and has increased in size with pain mainly with walking.  She reports having similar symptoms in the past to her underarms.  She has not tried anything to help alleviate her symptoms.  She denies fever, chills, vomiting or swelling.    History reviewed. No pertinent past medical history. History reviewed. No pertinent past surgical history. Family History  Problem Relation Age of Onset  . Diabetes Mother    History  Substance Use Topics  . Smoking status: Current Every Day Smoker -- 0.50 packs/day for 2 years    Types: Cigarettes  . Smokeless tobacco: Never Used  . Alcohol Use: Yes     Comment: occ   OB History    Gravida Para Term Preterm AB TAB SAB Ectopic Multiple Living   1 1 1       1      Review of Systems  Constitutional: Negative for fever and chills.  Gastrointestinal: Negative for nausea, vomiting and abdominal pain.  Musculoskeletal: Negative for joint swelling and arthralgias.  Skin: Positive for color change.       Localized area of tenderness and redness to left thigh  Hematological: Negative for adenopathy.  All other systems reviewed and are negative.     Allergies  Review of patient's allergies indicates no known allergies.  Home Medications   Prior to Admission medications   Medication Sig Start Date End Date Taking? Authorizing Provider  acetaminophen-codeine 120-12 MG/5ML suspension Take 5 mLs by mouth every 6 (six) hours as needed for pain. 10/13/14   Teressa LowerVrinda Pickering, NP   albuterol (PROVENTIL HFA;VENTOLIN HFA) 108 (90 BASE) MCG/ACT inhaler Inhale 1-2 puffs into the lungs every 6 (six) hours as needed for wheezing or shortness of breath. 09/15/14   Burgess AmorJulie Idol, PA-C  etonogestrel (IMPLANON) 68 MG IMPL implant Inject 1 each into the skin once.    Historical Provider, MD   BP 125/62 mmHg  Pulse 95  Temp(Src) 97.7 F (36.5 C) (Oral)  Resp 18  Ht 5\' 4"  (1.626 m)  Wt 325 lb (147.419 kg)  BMI 55.76 kg/m2  SpO2 98% Physical Exam  Constitutional: She is oriented to person, place, and time. She appears well-developed and well-nourished. No distress.  HENT:  Head: Normocephalic and atraumatic.  Mouth/Throat: Oropharynx is clear and moist.  Cardiovascular: Normal rate, regular rhythm, normal heart sounds and intact distal pulses.   No murmur heard. Pulmonary/Chest: Effort normal and breath sounds normal. No respiratory distress.  Abdominal: Soft. She exhibits no distension. There is no tenderness. There is no rebound.  Musculoskeletal: Normal range of motion. She exhibits no edema or tenderness.  Lymphadenopathy:    She has no cervical adenopathy.  Neurological: She is alert and oriented to person, place, and time. She exhibits normal muscle tone. Coordination normal.  Skin: Skin is warm. Rash noted. There is erythema.  13 cm area of erythema to medial left thigh.  Mild central  induration w/o fluctuance or obvious abscess.   Psychiatric: She has a normal mood  and affect.  Nursing note and vitals reviewed.   ED Course  Procedures (including critical care time) Labs Review Labs Reviewed - No data to display  Imaging Review No results found.   EKG Interpretation None      MDM   Final diagnoses:  Cellulitis of left lower extremity    Pt is well appearing, ambulatory.  Non-toxic.  Cellulitis to medial left thigh without drainable abscess at this time.  I have marked the leading edge of erythema and will start patient on Bactrim and vicodin.  She  agrees to frequent warm water soaks or compresses and return in 1-2 days if not improving.     Garrett Mitchum L. Trisha Mangleriplett, PA-C 10/24/14 1538  Benny LennertJoseph L Zammit, MD 10/24/14 2055

## 2014-10-24 NOTE — ED Notes (Addendum)
Pt reports "was bitten by something" on Thursday. Pt reports "a knot" noted to inner left thigh.no redness noted to site in triage. Pt denies any known fever.

## 2015-02-02 ENCOUNTER — Encounter: Payer: Medicaid Other | Admitting: Advanced Practice Midwife

## 2015-02-10 ENCOUNTER — Encounter: Payer: Medicaid Other | Admitting: Advanced Practice Midwife

## 2015-07-28 ENCOUNTER — Emergency Department (HOSPITAL_COMMUNITY)
Admission: EM | Admit: 2015-07-28 | Discharge: 2015-07-29 | Disposition: A | Discharge status: Home / Self Care | Payer: Medicaid Other | Attending: Emergency Medicine | Admitting: Emergency Medicine

## 2015-07-28 ENCOUNTER — Encounter (HOSPITAL_COMMUNITY): Payer: Self-pay | Admitting: Emergency Medicine

## 2015-07-28 DIAGNOSIS — Z720 Tobacco use: Secondary | ICD-10-CM | POA: Insufficient documentation

## 2015-07-28 DIAGNOSIS — M79602 Pain in left arm: Secondary | ICD-10-CM | POA: Diagnosis not present

## 2015-07-28 DIAGNOSIS — Z79899 Other long term (current) drug therapy: Secondary | ICD-10-CM | POA: Diagnosis not present

## 2015-07-28 NOTE — ED Notes (Signed)
Pt c/o left arm pain for a while but got worse tonight.

## 2015-07-29 ENCOUNTER — Ambulatory Visit (HOSPITAL_COMMUNITY)
Admit: 2015-07-29 | Discharge: 2015-07-29 | Disposition: A | Discharge status: Home / Self Care | Payer: Medicaid Other | Source: Ambulatory Visit | Attending: Emergency Medicine | Admitting: Emergency Medicine

## 2015-07-29 DIAGNOSIS — M7989 Other specified soft tissue disorders: Secondary | ICD-10-CM | POA: Diagnosis not present

## 2015-07-29 DIAGNOSIS — M79602 Pain in left arm: Secondary | ICD-10-CM | POA: Insufficient documentation

## 2015-07-29 MED ORDER — HYDROCODONE-ACETAMINOPHEN 5-325 MG PO TABS: 1.0000 | ORAL_TABLET | Freq: Four times a day (QID) | ORAL | Status: DC | PRN

## 2015-07-29 MED ORDER — HYDROCODONE-ACETAMINOPHEN 5-325 MG PO TABS
2.0000 | ORAL_TABLET | Freq: Once | ORAL | Status: AC
  Administered 2015-07-29: 2 via ORAL
  Filled 2015-07-29: qty 2

## 2015-07-29 MED ORDER — ENOXAPARIN SODIUM 100 MG/ML ~~LOC~~ SOLN
150.0000 mg | Freq: Once | SUBCUTANEOUS | Status: AC
  Administered 2015-07-29: 150 mg via SUBCUTANEOUS
  Filled 2015-07-29: qty 2

## 2015-07-29 NOTE — ED Provider Notes (Signed)
24 year old female with a chief complaint of left arm pain for 2 weeks. Saw Dr. Elesa Massed last night, concern for possible DVT sent back for ultrasound this morning. Ultrasound negative for DVT discussed results with patient will have her follow-up with her PCP. NSAIDs for pain.  US Venous Img Upper Uni Left  07/29/2015   CLINICAL DATA:  Left upper extremity pain and swelling for 2 weeks  EXAM: LEFT UPPER EXTREMITY VENOUS DUPLEX ULTRASOUND  TECHNIQUE: Doppler venous assessment of the left upper extremity deep venous system was performed, including characterization of spectral flow, compressibility, and phasicity.  COMPARISON:  None.  FINDINGS: There is complete compressibility of the left jugular, subclavian, axillary, brachial veins. The radial and ulnar veins are also grossly compressible. No evidence of superficial vein thrombosis. Doppler analysis demonstrates phasicity.  IMPRESSION: No evidence of left upper extremity DVT.   Electronically Signed   By: Jolaine Click M.D.   On: 07/29/2015 13:32     Melene Plan, DO 07/29/15 1346

## 2015-07-29 NOTE — ED Provider Notes (Signed)
This chart was scribed for Selena Maw Nery Frappier, DO by Arlan Organ, ED Scribe. This patient was seen in room APA12/APA12 and the patient's care was started 12:02 AM.   TIME SEEN: 12:02 AM   CHIEF COMPLAINT:  Chief Complaint  Patient presents with  . Arm Pain     HPI:  HPI Comments: Selena Barr, R hand dominant is a 24 y.o. female without any pertinent past medical history who presents to the Emergency Department complaining of intermittent, ongoing L arm pain x 2 weeks; worsened in last 24 hours. Denies any recent injury or trauma to arm. Implanon removed last month after 3 years of use. Discomfort was improved after Implanon was removed but recently returned. Pain is made worse with deep palpation. No alleviating factors at this time. No OTC medications or home remedies attempted prior to arrival. Has noted small amount of swelling to the inner aspect of the upper left arm. No recent fever, chills, nausea, vomiting, chest pain, or shortness of breath. She denies any history of blood clots.  ROS: See HPI Constitutional: no fever  Eyes: no drainage  ENT: no runny nose   Cardiovascular:  no chest pain  Resp: no SOB  GI: no vomiting GU: no dysuria Integumentary: no rash  Allergy: no hives  Musculoskeletal: no leg swelling. Positive arthralgias  Neurological: no slurred speech ROS otherwise negative  PAST MEDICAL HISTORY/PAST SURGICAL HISTORY:  History reviewed. No pertinent past medical history.  MEDICATIONS:  Prior to Admission medications   Medication Sig Start Date End Date Taking? Authorizing Provider  acetaminophen-codeine 120-12 MG/5ML suspension Take 5 mLs by mouth every 6 (six) hours as needed for pain. 10/13/14   Teressa Lower, NP  albuterol (PROVENTIL HFA;VENTOLIN HFA) 108 (90 BASE) MCG/ACT inhaler Inhale 1-2 puffs into the lungs every 6 (six) hours as needed for wheezing or shortness of breath. 09/15/14   Burgess Amor, PA-C  etonogestrel (IMPLANON) 68 MG IMPL implant  Inject 1 each into the skin once.    Historical Provider, MD  HYDROcodone-acetaminophen (NORCO/VICODIN) 5-325 MG per tablet Take one-two tabs po q 4-6 hrs prn pain 10/24/14   Tammy Triplett, PA-C  sulfamethoxazole-trimethoprim (SEPTRA DS) 800-160 MG per tablet Take 1 tablet by mouth 2 (two) times daily. For 10 days 10/24/14   Pauline Aus, PA-C    ALLERGIES:  No Known Allergies  SOCIAL HISTORY:  Social History  Substance Use Topics  . Smoking status: Current Every Day Smoker -- 0.50 packs/day for 2 years    Types: Cigarettes  . Smokeless tobacco: Never Used  . Alcohol Use: Yes     Comment: occ    FAMILY HISTORY: Family History  Problem Relation Age of Onset  . Diabetes Mother     EXAM: BP 127/74 mmHg  Pulse 88  Temp(Src) 98 F (36.7 C) (Oral)  Resp 18  Ht  (1.626 m)  Wt 325 lb (147.419 kg)  BMI 55.76 kg/m2  SpO2 97%  LMP 07/26/2015 CONSTITUTIONAL: Alert and oriented and responds appropriately to questions. Well-appearing; well-nourished, afebrile, appears comfortable, obese HEAD: Normocephalic EYES: Conjunctivae clear, PERRL ENT: normal nose; no rhinorrhea; moist mucous membranes; pharynx without lesions noted NECK: Supple, no meningismus, no LAD  CARD: RRR; S1 and S2 appreciated; no murmurs, no clicks, no rubs, no gallops RESP: Normal chest excursion without splinting or tachypnea; breath sounds clear and equal bilaterally; no wheezes, no rhonchi, no rales, no hypoxia or respiratory distress, speaking full sentences ABD/GI: Normal bowel sounds; non-distended; soft, non-tender, no rebound,  no guarding, no peritoneal signs BACK:  The back appears normal and is non-tender to palpation, there is no CVA tenderness EXT: Normal ROM in all joints; no edema; normal capillary refill; no cyanosis, no calf tenderness; Tenderness and swelling to inner aspect of L upper arm with no erythema, warmth, fluctuance, or induration; Compartments soft; No joint effusions 2 plus radial  pulses bilaterally, Normal grip strength, no bony deformity, normal range of motion in the left shoulder and left elbow without joint effusion   SKIN: Normal color for age and race; warm NEURO: Moves all extremities equally, sensation to light touch intact diffusely, cranial nerves II through XII intact PSYCH: The patient's mood and manner are appropriate. Grooming and personal hygiene are appropriate.  MEDICAL DECISION MAKING: Patient here with pain in the medial aspect of the left upper arm with some associated swelling compared to the right side. Pain reproduced with palpation. No sign of abscess or cellulitis on exam. Reports having Implanon placed 2 years ago but had a recently removed a month ago which helped with her pain but now the pain has returned over the past 2 weeks and is worse today. No prior history of DVT but will obtain venous Doppler in the morning to rule out DVT. No history of bony injury to suggest patient needs an x-ray at this time. Have discussed with patient that if her venous Doppler is negative I recommend outpatient follow-up with a primary care physician. We'll provide her with pain medication in the meantime. Discussed usual and customary return precautions. She verbalized understanding and is comfortable with this plan. Doubt ACS as pain is reducible with palpation and she has no risk factors other than tobacco use. Denies chest pain or shortness of breath.    I personally performed the services described in this documentation, which was scribed in my presence. The recorded information has been reviewed and is accurate.   Selena Maw Teghan Philbin, DO 07/29/15 [phone]

## 2015-07-29 NOTE — Discharge Instructions (Signed)
You were seen in the ED for left arm pain that may be related to your birth control implant.  Please return in the morning after scheduled time for an ultrasound of your arm to evaluate for possible DVT (blood clot).  If your ultrasound does not show a blood clot, I recommend follow-up with your primary care physician. If you develop increased swelling, redness, warmth of your arm, fever, chest pain or shortness of breath, numbness or weakness in your arm, please return to the hospital.     Emergency Department Resource Guide 1) Find a Doctor and Pay Out of Pocket Although you won't have to find out who is covered by your insurance plan, it is a good idea to ask around and get recommendations. You will then need to call the office and see if the doctor you have chosen will accept you as a new patient and what types of options they offer for patients who are self-pay. Some doctors offer discounts or will set up payment plans for their patients who do not have insurance, but you will need to ask so you aren't surprised when you get to your appointment.  2) Contact Your Local Health Department Not all health departments have doctors that can see patients for sick visits, but many do, so it is worth a call to see if yours does. If you don't know where your local health department is, you can check in your phone book. The CDC also has a tool to help you locate your state's health department, and many state websites also have listings of all of their local health departments.  3) Find a Walk-in Clinic If your illness is not likely to be very severe or complicated, you may want to try a walk in clinic. These are popping up all over the country in pharmacies, drugstores, and shopping centers. They're usually staffed by nurse practitioners or physician assistants that have been trained to treat common illnesses and complaints. They're usually fairly quick and inexpensive. However, if you have serious medical  issues or chronic medical problems, these are probably not your best option.  No Primary Care Doctor: - Call Health Connect at  [phone] - they can help you locate a primary care doctor that  accepts your insurance, provides certain services, etc. - Physician Referral Service- [phone]  Chronic Pain Problems: Organization         Address  Phone   Notes  [facility]  [phone] Patients need to be referred by their primary care doctor.   Medication Assistance: Organization         Address  Phone   Notes  [facility] Medication [facility] [address] [place] [place], [place] [address] [phone] --Must be a resident of [facility] -- Must have NO insurance coverage whatsoever (no Medicaid/ Medicare, etc.) -- The pt. MUST have a primary care doctor that directs their care regularly and follows them in the community   [facility]ist  [phone]   [employer]  [phone]    Agencies that provide inexpensive medical care: Organization         Address  Phone   Notes  [facility]  [phone]   [facility]    [phone]   [facility] [address] [place], [place] [address] [phone]   [facility] [address] [place] [place] [phone]   Planned Parenthood    (  [phone]   [facility]    [phone]   [facility]  [address], [place] Phone:  [phone], Fax:  [phone] Hours of Operation:  9 am - 6 pm, M-F.  Also accepts Medicaid/Medicare and self-pay.  [facility] for [place], [address], [place] Phone: [phone], Fax: [phone]. Hours of Operation:  8:30 am - 5:30 pm, M-F.  Also accepts Medicaid and self-pay.  [facility] [place] [address], [place] Phone: [phone]   [place], [place], [place]  [phone], Ext. [phone] Mondays & Thursdays: 7-9 AM.  First 15 patients are seen on a first come, first serve basis.    [place] Providers:  Organization         Address  Phone   Notes  [facility] [address], [place] [phone] Also accepts self-pay patients.  [facility] [address], [place]  [phone]   [place], [address], [place] [phone]   [facility] [address] [place] [phone]   Lucianne Lei [address], [place]   [phone] Only accepts [place] Access [place] patients after they have their name applied to their card.   Self-Pay (no insurance) in [facility]:  Organization         Address  Phone   Notes  Sickle Cell Patients, [facility] [place] [phone]   [facility] [place] [phone]   [facility] [place]  [place], [place], [place] [phone]   [facility]/Dr. Osei-Bonsu  [address] [place] or [address], [place] [phone] Phone number for both [place] and [place] locations is the same.  [facility] [address] [place] [phone]   [facility] [address] [place] or [address] [phone]   [facility] [address] [place] [phone], phone; [phone], fax Sees patients 1st and 3rd Saturday of every month.  Must not qualify for public or private insurance (i.e. Medicaid, Medicare, [place] Health Choice, Veterans' Benefits)  Household income should be no more than 200% of the poverty level The clinic cannot treat you if you are pregnant or think you are pregnant  Sexually transmitted  diseases are not treated at the clinic.    Dental Care: Organization         Address  Phone  Notes  [facility] of [facility] [place] [phone] Accepts children up to age 73 who are enrolled in [place] or [place]; pregnant women with a Medicaid card; and children who have applied for Medicaid or [place] Health Choice, but were declined, whose parents can pay a reduced fee at time of service.  [facility] Department of [facility]  [address], [place] [phone] Accepts children up to age 14 who are enrolled in [place] or [place]; pregnant women with a Medicaid card; and children who have applied for Medicaid or [place], but were declined, whose parents can pay a  reduced fee at time of service.  [place] Adult [facility] Access PROGRAM  [place] [phone] Patients are seen by appointment only. Walk-ins are not accepted. [place] will see patients 42 years of age and older. Monday - Tuesday (8am-5pm) Most Wednesdays (8:30-5pm) $30 per visit, cash only  [facility] Adult Dental Access PROGRAM  [address], [facility] [phone] Patients are seen by appointment only. Walk-ins are not accepted. [place] will see patients 71 years of age and older. One Wednesday Evening (Monthly: Volunteer Based).  $30 per visit, cash only  [place]  [phone] for adults; Children under age 66, call Graduate Pediatric Dentistry at [phone]. Children aged 12-14, please call [phone] to request a pediatric application.  Dental services are provided in all areas of dental care including fillings, crowns and bridges, complete and partial dentures, implants, gum treatment, root canals, and extractions. Preventive care is also provided. Treatment is provided to both adults and children. Patients are selected via a  lottery and there is often a waiting list.   [facility] [address], [place]  [phone] [email or web address]   [facility] [address] [place], [place] [phone], Ext. [phone] Second and Fourth Thursday of each month, opens at 6:30 AM; Clinic ends at 9 AM.  Patients are seen on a first-come first-served basis, and a limited number are seen during each clinic.   [facility]  [address] [place], [place] [phone]   Eligibility Requirements You must have lived in [place], [place], or [place] counties for at least the last three months.   You cannot be eligible for state or federal sponsored [employer], including [employer], [place], or [employer].   You generally cannot be eligible for healthcare insurance through your employer.    How to apply: Eligibility screenings are held every Tuesday and Wednesday afternoon from 1:00 pm until 4:00 pm. You do not need an appointment for the interview!  [facility] [address] [place], [place]   [place]  [place] Department  [place]  [phone]    Behavioral Health Resources in the Community: Intensive Outpatient Programs Organization         Address  Phone  Notes  [place]. [address], [place], [place] [phone]   [facility] [address] [place], [place]   ADS: Alcohol & Drug Svcs [address] [place], [place]   [place] [address],  [place], [place] or [phone]   Substance Abuse Resources Organization         Address  Phone  Notes  Alcohol and Drug Services  [phone]   [place]  [phone]   The [place]   Chinita Pester  [phone]   Residential &  Outpatient Substance Abuse Program  [phone]   Psychological Services Organization         Address  Phone  Notes  [facility] [place]  [place]  [phone]   [place] [address] [place] or [phone]    Mobile Crisis Teams Organization         Address  Phone  Notes  [facility], Mobile Crisis Care Unit  [phone]  [facility]  [address] [place], [place]   [facility] [address], [place] [phone]    Self-Help/Support Groups Organization         Address  Phone             Notes  [facility]ental Health [facility]oc[facility] of [place] - variety of support groups  [place] Call for more information  Narcotics Anonymous (NA), [facility] [address], [employer] [place]  2 meetings at this location   Special educational needs teacher         Address  Phone  Notes  [facility] Residential Treatment [place],    [place]  [phone]   [facility]  [address] [place] [address], [place], [place]   [place], [place] [phone] Admissions: 8am-3pm M-F  Incentives Substance [place] [address]    [address], [place] [phone]   [facility] [address] [place], [place], [place]   [facility] [address]  [place], [place]   [facility] - Intensive Outpatient [address], [place], [place]   [facility] ([place] [place].,  [place], [place] [phone] or [phone]   [facility] Services (RTS) [address], [place], [place] Accepts Medicaid  [facility] [address],  [place] [phone] Substance Abuse/Addiction Treatment   [facility] Organization          Address  Phone  Notes  [facility]  [phone]   Domenic Schwab, PhD [address] [place], [place]   [phone] or [phone]   [place] [facility] [place], [place] [phone]   [facility] [address] [place], [place] [phone] Insurance/Medicaid/sponsorship through [facility] and Families [address] [place]                                    [place], [place] [phone] [place] [address], [place] [phone]    Dr. Adele Schilder  [phone]   [facility] Dept. 1) [address] [place] 2) [facility] 3)  [place] [address], [place] [phone]  [phone]   [place] [phone] or [phone] (After Hours)

## 2015-08-03 ENCOUNTER — Emergency Department (HOSPITAL_COMMUNITY)
Admission: EM | Admit: 2015-08-03 | Discharge: 2015-08-03 | Disposition: A | Discharge status: Home / Self Care | Payer: Medicaid Other | Attending: Emergency Medicine | Admitting: Emergency Medicine

## 2015-08-03 ENCOUNTER — Encounter (HOSPITAL_COMMUNITY): Payer: Self-pay | Admitting: *Deleted

## 2015-08-03 DIAGNOSIS — Z79899 Other long term (current) drug therapy: Secondary | ICD-10-CM | POA: Insufficient documentation

## 2015-08-03 DIAGNOSIS — Z720 Tobacco use: Secondary | ICD-10-CM | POA: Insufficient documentation

## 2015-08-03 DIAGNOSIS — J3489 Other specified disorders of nose and nasal sinuses: Secondary | ICD-10-CM | POA: Insufficient documentation

## 2015-08-03 DIAGNOSIS — R519 Headache, unspecified: Secondary | ICD-10-CM

## 2015-08-03 DIAGNOSIS — R0981 Nasal congestion: Secondary | ICD-10-CM | POA: Diagnosis not present

## 2015-08-03 DIAGNOSIS — R51 Headache: Secondary | ICD-10-CM | POA: Insufficient documentation

## 2015-08-03 MED ORDER — BUTALBITAL-APAP-CAFFEINE 50-325-40 MG PO TABS: 1.0000 | ORAL_TABLET | ORAL | Status: DC | PRN

## 2015-08-03 MED ORDER — PSEUDOEPHEDRINE HCL 60 MG PO TABS: 60.0000 mg | ORAL_TABLET | Freq: Four times a day (QID) | ORAL | Status: DC | PRN

## 2015-08-03 MED ORDER — DIPHENHYDRAMINE HCL 25 MG PO CAPS
50.0000 mg | ORAL_CAPSULE | Freq: Once | ORAL | Status: AC
  Administered 2015-08-03: 50 mg via ORAL
  Filled 2015-08-03: qty 2

## 2015-08-03 MED ORDER — METOCLOPRAMIDE HCL 5 MG/ML IJ SOLN
10.0000 mg | Freq: Once | INTRAMUSCULAR | Status: AC
  Administered 2015-08-03: 10 mg via INTRAMUSCULAR
  Filled 2015-08-03: qty 2

## 2015-08-03 MED ORDER — KETOROLAC TROMETHAMINE 60 MG/2ML IM SOLN
60.0000 mg | Freq: Once | INTRAMUSCULAR | Status: AC
  Administered 2015-08-03: 60 mg via INTRAMUSCULAR
  Filled 2015-08-03: qty 2

## 2015-08-03 NOTE — ED Notes (Signed)
Pt with HA for 2 weeks, c/o as pressure , also with congestion

## 2015-08-03 NOTE — ED Provider Notes (Signed)
CSN: [id number]     Arrival date & time 08/03/15  1657 History   First MD Initiated Contact with Patient 08/03/15 1853     Chief Complaint  Patient presents with  . Headache     (Consider location/radiation/quality/duration/timing/severity/associated sxs/prior Treatment) HPI   Selena Barr is a 24 y.o. female who presents to the Emergency Department complaining of frontal headache for 2 weeks and nasal congestion.  She describes the headache as a throbbing sensation to her face and the top of hr head.  She notes clear rhinorrhea.  Headache was gradual in onset.  She has taken OTC medications without relief.  She denies fever, rash, neck pain or stiffness, vomiting, cough or sore throat.     History reviewed. No pertinent past medical history. History reviewed. No pertinent past surgical history. Family History  Problem Relation Age of Onset  . Diabetes Mother    Social History  Substance Use Topics  . Smoking status: Current Every Day Smoker -- 0.50 packs/day for 2 years    Types: Cigarettes  . Smokeless tobacco: Never Used  . Alcohol Use: Yes     Comment: occ   OB History    Gravida Para Term Preterm AB TAB SAB Ectopic Multiple Living   1 1 1       1      Review of Systems  Constitutional: Negative for fever, activity change and appetite change.  HENT: Negative for facial swelling, sore throat and trouble swallowing.   Eyes: Negative for photophobia, pain and visual disturbance.  Respiratory: Negative for shortness of breath.   Cardiovascular: Negative for chest pain.  Gastrointestinal: Negative for nausea, vomiting and abdominal pain.  Musculoskeletal: Negative for neck pain and neck stiffness.  Skin: Negative for rash and wound.  Neurological: Positive for headaches. Negative for dizziness, facial asymmetry, speech difficulty, weakness and numbness.  Psychiatric/Behavioral: Negative for confusion and decreased concentration.  All other systems reviewed and are  negative.     Allergies  Review of patient's allergies indicates no known allergies.  Home Medications   Prior to Admission medications   Medication Sig Start Date End Date Taking? Authorizing Provider  acetaminophen-codeine 120-12 MG/5ML suspension Take 5 mLs by mouth every 6 (six) hours as needed for pain. 10/13/14   Teressa Lower, NP  albuterol (PROVENTIL HFA;VENTOLIN HFA) 108 (90 BASE) MCG/ACT inhaler Inhale 1-2 puffs into the lungs every 6 (six) hours as needed for wheezing or shortness of breath. 09/15/14   Burgess Amor, PA-C  etonogestrel (IMPLANON) 68 MG IMPL implant Inject 1 each into the skin once.    Historical Provider, MD  HYDROcodone-acetaminophen (NORCO/VICODIN) 5-325 MG per tablet Take 1 tablet by mouth every 6 (six) hours as needed. 07/29/15   Kristen N Ward, DO  sulfamethoxazole-trimethoprim (SEPTRA DS) 800-160 MG per tablet Take 1 tablet by mouth 2 (two) times daily. For 10 days 10/24/14   Eleaner Dibartolo, PA-C   BP 120/67 mmHg  Pulse 89  Temp(Src) 97.8 F (36.6 C) (Oral)  Resp 18  Ht 5\' 3"  (1.6 m)  Wt 338 lb 1.6 oz (153.361 kg)  BMI 59.91 kg/m2  SpO2 100%  LMP 07/26/2015 Physical Exam  Constitutional: She is oriented to person, place, and time. She appears well-developed and well-nourished. No distress.  HENT:  Head: Normocephalic and atraumatic.  Mouth/Throat: Oropharynx is clear and moist.  Eyes: EOM are normal. Pupils are equal, round, and reactive to light.  Neck: Normal range of motion, full passive range of motion without pain  and phonation normal. Neck supple. No spinous process tenderness and no muscular tenderness present. No rigidity. No Kernig's sign noted.  Cardiovascular: Normal rate, regular rhythm, normal heart sounds and intact distal pulses.   No murmur heard. Pulmonary/Chest: Effort normal and breath sounds normal. No respiratory distress.  Musculoskeletal: Normal range of motion.  Neurological: She is alert and oriented to person, place,  and time. She has normal strength. No cranial nerve deficit or sensory deficit. She exhibits normal muscle tone. Coordination and gait normal. GCS eye subscore is 4. GCS verbal subscore is 5. GCS motor subscore is 6.  Reflex Scores:      Tricep reflexes are 2+ on the right side and 2+ on the left side.      Bicep reflexes are 2+ on the right side and 2+ on the left side. Skin: Skin is warm and dry.  Psychiatric: She has a normal mood and affect.  Nursing note and vitals reviewed.   ED Course  Procedures (including critical care time) Labs Review Labs Reviewed - No data to display    EKG Interpretation None      MDM   Final diagnoses:  Sinus headache    Pt with gradual onset of frontal headache for 2 weeks.  She was seen here 6 days ago and no mention of a headache was noted in here previous chart.  Pt states she didn't mention it because the pain was not that severe.  Tonight she is well appearing, non-toxic.  She does have nasal congestion and tenderness of the frontal and maxillary sinuses.  She is otherwise well appearing, no focal neuro deficits, no meningeal signs.  Vitals stable.  Headache has improved after tx.  Agrees to close PMD f/u if needed.      Pauline Aus, PA-C 08/05/15 2102  Samuel Jester, DO 08/07/15 [phone]

## 2015-08-03 NOTE — Discharge Instructions (Signed)
Sinus Headache °A sinus headache happens when your sinuses become clogged or puffy (swollen). Sinus headaches can be mild or severe. °HOME CARE °· Take your medicines (antibiotics) as told. Finish them even if you start to feel better. °· Only take medicine as told by your doctor. °· Use a nose spray if you feel stuffed up (congested). °GET HELP RIGHT AWAY IF: °· You have a fever. °· You have trouble seeing. °· You suddenly have pain in your face or head. °· You start to twitch or shake (seizure). °· You are confused. °· You get headaches more than once a week. °· Light or sound bothers you. °· You feel sick to your stomach (nauseous) or throw up (vomit). °· Your headaches do not get better with treatment. °MAKE SURE YOU: °· Understand these instructions. °· Will watch your condition. °· Will get help right away if you are not doing well or get worse. °Document Released: 03/22/2011 Document Revised: 02/12/2012 Document Reviewed: 03/22/2011 °ExitCare® Patient Information ©2015 [employer]. This information is not intended to replace advice given to you by your health care provider. Make sure you discuss any questions you have with your health care provider. ° °

## 2015-08-26 ENCOUNTER — Other Ambulatory Visit: Payer: Self-pay | Admitting: Adult Health

## 2015-08-26 NOTE — Telephone Encounter (Signed)
Pt requesting refill for Megace for irregular bleeding with Nexplanon but has not been seen in our office since 2013. Pt informed would need to make an appt. Appt scheduled with Cyril Mourning, NP for Monday, 08/30/2015. Pt informed to bring mcd card and copay. Pt verbalized understanding.

## 2015-08-30 ENCOUNTER — Encounter: Payer: Self-pay | Admitting: Adult Health

## 2015-09-06 ENCOUNTER — Encounter: Payer: Self-pay | Admitting: Obstetrics & Gynecology

## 2015-09-13 ENCOUNTER — Encounter: Payer: Self-pay | Admitting: Obstetrics & Gynecology

## 2015-09-16 ENCOUNTER — Encounter (HOSPITAL_COMMUNITY): Payer: Self-pay | Admitting: *Deleted

## 2015-09-16 ENCOUNTER — Emergency Department (HOSPITAL_COMMUNITY)
Admission: EM | Admit: 2015-09-16 | Discharge: 2015-09-16 | Disposition: A | Discharge status: Home / Self Care | Payer: Medicaid Other | Attending: Emergency Medicine | Admitting: Emergency Medicine

## 2015-09-16 ENCOUNTER — Encounter: Payer: Self-pay | Admitting: Obstetrics & Gynecology

## 2015-09-16 DIAGNOSIS — Z23 Encounter for immunization: Secondary | ICD-10-CM | POA: Insufficient documentation

## 2015-09-16 DIAGNOSIS — S0501XA Injury of conjunctiva and corneal abrasion without foreign body, right eye, initial encounter: Secondary | ICD-10-CM | POA: Diagnosis not present

## 2015-09-16 DIAGNOSIS — Z720 Tobacco use: Secondary | ICD-10-CM | POA: Insufficient documentation

## 2015-09-16 DIAGNOSIS — S0591XA Unspecified injury of right eye and orbit, initial encounter: Secondary | ICD-10-CM | POA: Diagnosis present

## 2015-09-16 DIAGNOSIS — Y998 Other external cause status: Secondary | ICD-10-CM | POA: Insufficient documentation

## 2015-09-16 DIAGNOSIS — Y9389 Activity, other specified: Secondary | ICD-10-CM | POA: Insufficient documentation

## 2015-09-16 DIAGNOSIS — Y9289 Other specified places as the place of occurrence of the external cause: Secondary | ICD-10-CM | POA: Diagnosis not present

## 2015-09-16 DIAGNOSIS — X58XXXA Exposure to other specified factors, initial encounter: Secondary | ICD-10-CM | POA: Diagnosis not present

## 2015-09-16 MED ORDER — TETANUS-DIPHTH-ACELL PERTUSSIS 5-2.5-18.5 LF-MCG/0.5 IM SUSP
0.5000 mL | Freq: Once | INTRAMUSCULAR | Status: AC
  Administered 2015-09-16: 0.5 mL via INTRAMUSCULAR
  Filled 2015-09-16: qty 0.5

## 2015-09-16 MED ORDER — TOBRAMYCIN 0.3 % OP SOLN: 2.0000 [drp] | OPHTHALMIC | Status: DC

## 2015-09-16 MED ORDER — HYDROCODONE-ACETAMINOPHEN 5-325 MG PO TABS
2.0000 | ORAL_TABLET | Freq: Once | ORAL | Status: AC
  Administered 2015-09-16: 2 via ORAL
  Filled 2015-09-16: qty 2

## 2015-09-16 MED ORDER — HYDROCODONE-ACETAMINOPHEN 5-325 MG PO TABS: 2.0000 | ORAL_TABLET | ORAL | Status: DC | PRN

## 2015-09-16 NOTE — ED Notes (Signed)
Pt states itching, redness and clear drainage from the right eye which began this morning.

## 2015-09-16 NOTE — ED Provider Notes (Signed)
CSN: [id number]     Arrival date & time 09/16/15  1117 History   First MD Initiated Contact with Patient 09/16/15 1212     Chief Complaint  Patient presents with  . Eye Problem     (Consider location/radiation/quality/duration/timing/severity/associated sxs/prior Treatment) Patient is a 24 y.o. female presenting with eye problem. The history is provided by the patient. No language interpreter was used.  Eye Problem Location:  R eye Quality:  Aching Severity:  Moderate Onset quality:  Gradual Timing:  Constant Progression:  Worsening Chronicity:  New Relieved by:  Nothing Worsened by:  Nothing tried Ineffective treatments:  None tried Associated symptoms: discharge   Associated symptoms: no blurred vision   Pt reports her child poked her in the eye. Pt complains of redness and drainage.  History reviewed. No pertinent past medical history. History reviewed. No pertinent past surgical history. Family History  Problem Relation Age of Onset  . Diabetes Mother    Social History  Substance Use Topics  . Smoking status: Current Every Day Smoker -- 0.50 packs/day for 2 years    Types: Cigarettes  . Smokeless tobacco: Never Used  . Alcohol Use: Yes     Comment: occ   OB History    Gravida Para Term Preterm AB TAB SAB Ectopic Multiple Living   Review of Systems  Eyes: Positive for discharge. Negative for blurred vision.  All other systems reviewed and are negative.     Allergies  Review of patient's allergies indicates no known allergies.  Home Medications   Prior to Admission medications   Medication Sig Start Date End Date Taking? Authorizing Provider  butalbital-acetaminophen-caffeine (FIORICET) 50-325-40 MG per tablet Take 1 tablet by mouth every 4 (four) hours as needed for headache. Patient not taking: Reported on 09/16/2015 08/03/15 08/02/16  Tammy Triplett, PA-C  HYDROcodone-acetaminophen (NORCO/VICODIN) 5-325 MG tablet Take 2 tablets by  mouth every 4 (four) hours as needed. 09/16/15   Elson Areas, PA-C  pseudoephedrine (SUDAFED) 60 MG tablet Take 1 tablet (60 mg total) by mouth every 6 (six) hours as needed for congestion. Patient not taking: Reported on 09/16/2015 08/03/15   Tammy Triplett, PA-C  tobramycin (TOBREX) 0.3 % ophthalmic solution Place 2 drops into the right eye every 4 (four) hours. 09/16/15   Lonia Skinner Shacoria Latif, PA-C   BP 130/83 mmHg  Pulse 86  Temp(Src) 98.4 F (36.9 C)  Ht  (1.626 m)  Wt 338 lb (153.316 kg)  BMI 57.99 kg/m2  SpO2 100%  LMP 08/21/2015 Physical Exam  Constitutional: She is oriented to person, place, and time. She appears well-developed and well-nourished.  HENT:  Head: Normocephalic and atraumatic.  Right Ear: External ear normal.  Nose: Nose normal.  Mouth/Throat: Oropharynx is clear and moist.  Eyes: Conjunctivae and EOM are normal. Pupils are equal, round, and reactive to light.  Injected right eye  Small area of uptake,  (nail shaped)  Neck: Normal range of motion.  Cardiovascular: Normal rate.   Pulmonary/Chest: Effort normal.  Abdominal: She exhibits no distension.  Musculoskeletal: Normal range of motion.  Neurological: She is alert and oriented to person, place, and time.  Psychiatric: She has a normal mood and affect.  Nursing note and vitals reviewed.   ED Course  Procedures (including critical care time) Labs Review Labs Reviewed - No data to display  Imaging Review No results found. I have personally reviewed and evaluated these  images and lab results as part of my medical decision-making.   EKG Interpretation None      MDM   Final diagnoses:  Corneal abrasion, right, initial encounter     tobrex Hydrocodone    Elson AreasLeslie K Corleen Otwell, PA-C 09/16/15 1654  Vanetta MuldersScott Zackowski, MD 09/17/15 [phone]

## 2015-09-16 NOTE — Discharge Instructions (Signed)

## 2015-09-22 ENCOUNTER — Encounter: Payer: Self-pay | Admitting: Obstetrics & Gynecology

## 2015-09-24 ENCOUNTER — Encounter: Payer: Self-pay | Admitting: Obstetrics & Gynecology

## 2015-09-27 ENCOUNTER — Encounter: Payer: Self-pay | Admitting: Obstetrics & Gynecology

## 2015-10-30 ENCOUNTER — Emergency Department (HOSPITAL_COMMUNITY)
Admission: EM | Admit: 2015-10-30 | Discharge: 2015-10-31 | Disposition: A | Discharge status: Home / Self Care | Payer: Medicaid Other | Attending: Emergency Medicine | Admitting: Emergency Medicine

## 2015-10-30 ENCOUNTER — Encounter (HOSPITAL_COMMUNITY): Payer: Self-pay | Admitting: *Deleted

## 2015-10-30 DIAGNOSIS — J029 Acute pharyngitis, unspecified: Secondary | ICD-10-CM | POA: Diagnosis not present

## 2015-10-30 DIAGNOSIS — J028 Acute pharyngitis due to other specified organisms: Secondary | ICD-10-CM

## 2015-10-30 DIAGNOSIS — H9202 Otalgia, left ear: Secondary | ICD-10-CM | POA: Diagnosis not present

## 2015-10-30 DIAGNOSIS — F17210 Nicotine dependence, cigarettes, uncomplicated: Secondary | ICD-10-CM | POA: Diagnosis not present

## 2015-10-30 DIAGNOSIS — B9789 Other viral agents as the cause of diseases classified elsewhere: Secondary | ICD-10-CM

## 2015-10-30 NOTE — ED Notes (Signed)
Pt c/o sore throat x 2 days and left ear pain.

## 2015-10-31 LAB — RAPID STREP SCREEN (MED CTR MEBANE ONLY): Streptococcus, Group A Screen (Direct): NEGATIVE

## 2015-10-31 MED ORDER — IBUPROFEN 800 MG PO TABS
800.0000 mg | ORAL_TABLET | Freq: Once | ORAL | Status: AC
  Administered 2015-10-31: 800 mg via ORAL
  Filled 2015-10-31: qty 1

## 2015-10-31 MED ORDER — MAGIC MOUTHWASH W/LIDOCAINE: 10.0000 mL | Freq: Four times a day (QID) | ORAL | Status: DC | PRN

## 2015-10-31 NOTE — Discharge Instructions (Signed)

## 2015-10-31 NOTE — ED Notes (Signed)
Discharge instructions given, pt demonstrated teach back and verbal understanding. No concerns voiced.  

## 2015-11-02 LAB — CULTURE, GROUP A STREP: Strep A Culture: NEGATIVE

## 2015-11-02 NOTE — ED Provider Notes (Signed)
CSN: [id number]     Arrival date & time 10/30/15  2235 History   First MD Initiated Contact with Patient 10/30/15 2355     Chief Complaint  Patient presents with  . Sore Throat     (Consider location/radiation/quality/duration/timing/severity/associated sxs/prior Treatment) The history is provided by the patient.   Selena Barr is a 24 y.o. female presenting with a 2 day history of sore throat with left ear pain, worsened with swallowing.  She denies fevers or chills but has had nasal congestion with clear rhinorrhea, dry cough.  She denies sob, chest pain, headache or sinus pain.  She denies any known exposures to strep infection. She has taken no medicines prior to arrival.    History reviewed. No pertinent past medical history. History reviewed. No pertinent past surgical history. Family History  Problem Relation Age of Onset  . Diabetes Mother    Social History  Substance Use Topics  . Smoking status: Current Every Day Smoker -- 0.50 packs/day for 2 years    Types: Cigarettes  . Smokeless tobacco: Never Used  . Alcohol Use: Yes     Comment: occ   OB History    Gravida Para Term Preterm AB TAB SAB Ectopic Multiple Living   Review of Systems  Constitutional: Negative for fever and chills.  HENT: Positive for congestion, ear pain, rhinorrhea and sore throat. Negative for sinus pressure, trouble swallowing and voice change.   Eyes: Negative for discharge.  Respiratory: Positive for cough. Negative for shortness of breath, wheezing and stridor.   Cardiovascular: Negative for chest pain.  Gastrointestinal: Negative for abdominal pain.  Genitourinary: Negative.       Allergies  Review of patient's allergies indicates no known allergies.  Home Medications   Prior to Admission medications   Medication Sig Start Date End Date Taking? Authorizing Provider  butalbital-acetaminophen-caffeine (FIORICET) 50-325-40 MG per tablet Take 1 tablet by mouth  every 4 (four) hours as needed for headache. Patient not taking: Reported on 09/16/2015 08/03/15 08/02/16  Tammy Triplett, PA-C  HYDROcodone-acetaminophen (NORCO/VICODIN) 5-325 MG tablet Take 2 tablets by mouth every 4 (four) hours as needed. 09/16/15   Elson Areas, PA-C  magic mouthwash w/lidocaine SOLN Take 10 mLs by mouth 4 (four) times daily as needed (throat pain). 10/31/15   Burgess Amor, PA-C  pseudoephedrine (SUDAFED) 60 MG tablet Take 1 tablet (60 mg total) by mouth every 6 (six) hours as needed for congestion. Patient not taking: Reported on 09/16/2015 08/03/15   Tammy Triplett, PA-C  tobramycin (TOBREX) 0.3 % ophthalmic solution Place 2 drops into the right eye every 4 (four) hours. 09/16/15   Elson Areas, PA-C   BP 144/71 mmHg  Pulse 65  Temp(Src) 98.1 F (36.7 C) (Oral)  Resp 18  Ht  (1.626 m)  Wt 152.012 kg  BMI 57.50 kg/m2  SpO2 100%  LMP 10/10/2015 Physical Exam  Constitutional: She is oriented to person, place, and time. She appears well-developed and well-nourished.  HENT:  Head: Normocephalic and atraumatic.  Right Ear: Tympanic membrane and ear canal normal. Tympanic membrane is not injected, not retracted and not bulging.  Left Ear: Tympanic membrane and ear canal normal. Tympanic membrane is not injected, not retracted and not bulging.  Nose: Mucosal edema and rhinorrhea present.  Mouth/Throat: Uvula is midline and mucous membranes are normal. Posterior oropharyngeal erythema present. No oropharyngeal exudate, posterior oropharyngeal edema or tonsillar abscesses.  Mild posterior pharyngeal edema, no edema. No exudate.  Eyes: Conjunctivae are normal.  Cardiovascular: Normal rate and normal heart sounds.   Pulmonary/Chest: Effort normal. No respiratory distress. She has no wheezes. She has no rales.  Musculoskeletal: Normal range of motion.  Neurological: She is alert and oriented to person, place, and time.  Skin: Skin is warm and dry. No rash noted.   Psychiatric: She has a normal mood and affect.    ED Course  Procedures (including critical care time) Labs Review Labs Reviewed  RAPID STREP SCREEN (NOT AT [facility])  CULTURE, GROUP A STREP    Imaging Review No results found. I have personally reviewed and evaluated these images and lab results as part of my medical decision-making.   EKG Interpretation None      MDM   Final diagnoses:  Acute viral pharyngitis    Patients labs reviewed.   Results were also discussed with patient. Pt prescribed magic mouthwash for sx relief, motrin prn also discussed rest, increased fluid intake, salt water gargles.  Prn f/u anticipated, strep cx pending.    Burgess AmorJulie Shareen Capwell, PA-C 11/02/15 2157  Dione Boozeavid Glick, MD 11/02/15 2236

## 2016-01-03 ENCOUNTER — Ambulatory Visit: Payer: Self-pay | Admitting: Obstetrics & Gynecology

## 2016-01-04 ENCOUNTER — Ambulatory Visit (INDEPENDENT_AMBULATORY_CARE_PROVIDER_SITE_OTHER): Payer: Medicaid Other | Admitting: Obstetrics & Gynecology

## 2016-01-04 ENCOUNTER — Encounter: Payer: Self-pay | Admitting: Obstetrics & Gynecology

## 2016-01-04 VITALS — BP 110/80 | HR 84 | Wt 335.0 lb

## 2016-01-04 DIAGNOSIS — N939 Abnormal uterine and vaginal bleeding, unspecified: Secondary | ICD-10-CM

## 2016-01-04 LAB — POCT HEMOGLOBIN: HEMOGLOBIN: 14 g/dL (ref 12.2–16.2)

## 2016-01-04 MED ORDER — MEGESTROL ACETATE 40 MG PO TABS: ORAL_TABLET | ORAL | Status: DC

## 2016-01-04 NOTE — Progress Notes (Signed)
Patient ID: Selena Barr, female   DOB: February 17, 1991, 25 y.o.   MRN: [id number]      Chief Complaint  Patient presents with  . refill on Megace    on period x 1 month    Blood pressure 110/80, pulse 84, weight 335 lb (151.955 kg), last menstrual period 11/23/2015.  24 y.o. G1P1001 Patient's last menstrual period was 11/23/2015. The current method of family planning is none.  Subjective nexplanon out 6 months ago Started bleeding 1  months ago and has not stopped  Objective   Pertinent ROS   Labs or studies Hemoglobin 14    Impression Diagnoses this Encounter::   ICD-9-CM ICD-10-CM   1. Abnormal uterine bleeding (AUB) 626.9 N93.9     Established relevant diagnosis(es):   Plan/Recommendations: Meds ordered this encounter  Medications  . megestrol (MEGACE) 40 MG tablet    Sig: 3 tablets a day for 5 days, 2 tablets a day for 5 days then 1 tablet daily    Dispense:  45 tablet    Refill:  3    Labs or Scans Ordered: No orders of the defined types were placed in this encounter.    Management:: Nexpalnon placement  Follow up 2  weeks       Face to face time:  10 minutes  Greater than 50% of the visit time was spent in counseling and coordination of care with the patient.  The summary and outline of the counseling and care coordination is summarized in the note above.   All questions were answered.

## 2016-01-04 NOTE — Addendum Note (Signed)
Addended by: Federico Flake A on: 01/04/2016 12:09 PM   Modules accepted: Orders

## 2016-01-07 ENCOUNTER — Encounter (HOSPITAL_COMMUNITY): Payer: Self-pay | Admitting: Emergency Medicine

## 2016-01-07 ENCOUNTER — Emergency Department (HOSPITAL_COMMUNITY)
Admission: EM | Admit: 2016-01-07 | Discharge: 2016-01-07 | Disposition: A | Discharge status: Home / Self Care | Payer: Medicaid Other | Attending: Emergency Medicine | Admitting: Emergency Medicine

## 2016-01-07 DIAGNOSIS — F17210 Nicotine dependence, cigarettes, uncomplicated: Secondary | ICD-10-CM | POA: Diagnosis not present

## 2016-01-07 DIAGNOSIS — L02411 Cutaneous abscess of right axilla: Secondary | ICD-10-CM | POA: Diagnosis not present

## 2016-01-07 DIAGNOSIS — L089 Local infection of the skin and subcutaneous tissue, unspecified: Secondary | ICD-10-CM | POA: Diagnosis present

## 2016-01-07 MED ORDER — LIDOCAINE-EPINEPHRINE (PF) 2 %-1:200000 IJ SOLN
10.0000 mL | Freq: Once | INTRAMUSCULAR | Status: AC
  Administered 2016-01-07: 10 mL via INTRADERMAL
  Filled 2016-01-07: qty 20

## 2016-01-07 MED ORDER — ACETAMINOPHEN-CODEINE #3 300-30 MG PO TABS: 1.0000 | ORAL_TABLET | Freq: Four times a day (QID) | ORAL | Status: DC | PRN

## 2016-01-07 MED ORDER — POVIDONE-IODINE 10 % EX SOLN
CUTANEOUS | Status: AC
  Filled 2016-01-07: qty 118

## 2016-01-07 NOTE — Discharge Instructions (Signed)

## 2016-01-07 NOTE — ED Notes (Signed)
I&D setup at bedside.

## 2016-01-07 NOTE — ED Notes (Signed)
Pt reports "boil" to right axilla x1 week. Pt reports tried warm compresses with no relief. Pt reports history of same. Pt reports intermittent nausea. nad noted.

## 2016-01-07 NOTE — ED Provider Notes (Signed)
CSN: [id number]     Arrival date & time 01/07/16  8469 History   First MD Initiated Contact with Patient 01/07/16 [phone]     No chief complaint on file.    (Consider location/radiation/quality/duration/timing/severity/associated sxs/prior Treatment) HPI   25 year old female presents for evaluation of pain to R axillary region. 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 For the past week she has had pain and swelling to her right axillary region similar to prior abscesses. She has tried using warm compress without adequate relief and is here requesting for I&D. She is up-to-date with immunization including tetanus. She rates her pain as moderate in intensity. No associated fever or rash.   No past medical history on file. No past surgical history on file. Family History  Problem Relation Age of Onset  . Diabetes Mother    Social History  Substance Use Topics  . Smoking status: Current Every Day Smoker -- 0.50 packs/day for 2 years    Types: Cigarettes  . Smokeless tobacco: Never Used  . Alcohol Use: Yes     Comment: occ   OB History    Gravida Para Term Preterm AB TAB SAB Ectopic Multiple Living   Review of Systems  Constitutional: Negative for fever.  Skin: Negative for rash.      Allergies  Review of patient's allergies indicates no known allergies.  Home Medications   Prior to Admission medications   Medication Sig Start Date End Date Taking? Authorizing Provider  megestrol (MEGACE) 40 MG tablet 3 tablets a day for 5 days, 2 tablets a day for 5 days then 1 tablet daily 01/04/16   Lazaro Arms, MD   LMP 11/23/2015 Physical Exam  Constitutional: She appears well-developed and well-nourished. No distress.  Morbidly obese African-American female appears to be in no acute distress.  HENT:  Head: Atraumatic.  Eyes: Conjunctivae are normal.  Neck: Neck supple.  Neurological: She is alert.  Skin: No rash noted.  An area of induration and  fluctuance noted to right axillary fold tender to palpation but no surrounding skin erythema  Psychiatric: She has a normal mood and affect.  Nursing note and vitals reviewed.   ED Course  Procedures (including critical care time)  INCISION AND DRAINAGE Performed by: Fayrene Helper Consent: Verbal consent obtained. Risks and benefits: risks, benefits and alternatives were discussed Type: abscess  Body area: R axillary fold  Anesthesia: local infiltration  Incision was made with a scalpel.  Local anesthetic: lidocaine 2% w epinephrine  Anesthetic total: 2 ml  Complexity: complex Blunt dissection to break up loculations  Drainage: purulent  Drainage amount: small  Packing material: 1/4 in iodoform gauze  Patient tolerance: Patient tolerated the procedure well with no immediate complications.     MDM   Final diagnoses:  Cutaneous abscess of right axilla    BP 139/91 mmHg  Pulse 93  Temp(Src) 97.8 F (36.6 C)  Resp 20  Ht  (1.626 m)  Wt 151.501 kg  BMI 57.30 kg/m2  SpO2 97%  LMP 01/07/2016   9:02 AM Patient presents with an area of induration and fluctuance consistence with an abscess to right axillary fold.    Fayrene Helper, PA-C 01/07/16 2841  Bethann Berkshire, MD 01/10/16 2150

## 2016-01-18 ENCOUNTER — Ambulatory Visit (INDEPENDENT_AMBULATORY_CARE_PROVIDER_SITE_OTHER): Payer: Medicaid Other | Admitting: Obstetrics & Gynecology

## 2016-01-18 ENCOUNTER — Encounter: Payer: Self-pay | Admitting: Obstetrics & Gynecology

## 2016-01-18 VITALS — BP 140/100 | HR 76 | Wt 339.0 lb

## 2016-01-18 DIAGNOSIS — Z3202 Encounter for pregnancy test, result negative: Secondary | ICD-10-CM | POA: Diagnosis not present

## 2016-01-18 DIAGNOSIS — Z30017 Encounter for initial prescription of implantable subdermal contraceptive: Secondary | ICD-10-CM

## 2016-01-18 DIAGNOSIS — Z3046 Encounter for surveillance of implantable subdermal contraceptive: Secondary | ICD-10-CM | POA: Diagnosis not present

## 2016-01-18 DIAGNOSIS — Z3049 Encounter for surveillance of other contraceptives: Secondary | ICD-10-CM | POA: Diagnosis not present

## 2016-01-18 LAB — POCT URINE PREGNANCY: Preg Test, Ur: NEGATIVE

## 2016-01-18 NOTE — Progress Notes (Signed)
Patient ID: RAAGA MAEDER, female   DOB: [birth year], 25 y.o.   MRN: [id number] Nexplanon Insertion Note:  G1P1001  UPT: done: negative  Pt presents desiring placement of LARC in the form of Nexplanon She has been counseled regarding various methods including OCP, nuva ring, levonorgestrel IUD, Depo Provera injections and Nexplanon.  She has chosen Nexplanon and understands its primary unpleasant side effect of irregular bleeding.  The left arm is inspected and is appropriate for placement. The area is prepped with betadine. 3 cc 1% lidocaine is injected at the proposed injection site.  A small stab incision is made with a #11 blade. The Nexplanon device is then inserted into the previously anesthetized area. It is released without difficulty. It is palpable by both patient and myself. There is minimal bleeding.  The stab incision is reapproximated with 3 steri strips. A wrap gauze dressing is placed which patient will leave in place for 2 days or so.

## 2016-04-04 ENCOUNTER — Emergency Department (HOSPITAL_COMMUNITY)
Admission: EM | Admit: 2016-04-04 | Discharge: 2016-04-04 | Disposition: A | Discharge status: Home / Self Care | Payer: Medicaid Other | Attending: Emergency Medicine | Admitting: Emergency Medicine

## 2016-04-04 ENCOUNTER — Encounter (HOSPITAL_COMMUNITY): Payer: Self-pay | Admitting: Emergency Medicine

## 2016-04-04 DIAGNOSIS — T672XXA Heat cramp, initial encounter: Secondary | ICD-10-CM | POA: Diagnosis not present

## 2016-04-04 DIAGNOSIS — F17210 Nicotine dependence, cigarettes, uncomplicated: Secondary | ICD-10-CM | POA: Insufficient documentation

## 2016-04-04 DIAGNOSIS — Z79899 Other long term (current) drug therapy: Secondary | ICD-10-CM | POA: Diagnosis not present

## 2016-04-04 DIAGNOSIS — E876 Hypokalemia: Secondary | ICD-10-CM | POA: Insufficient documentation

## 2016-04-04 DIAGNOSIS — R252 Cramp and spasm: Secondary | ICD-10-CM | POA: Diagnosis present

## 2016-04-04 DIAGNOSIS — R739 Hyperglycemia, unspecified: Secondary | ICD-10-CM | POA: Insufficient documentation

## 2016-04-04 DIAGNOSIS — R1013 Epigastric pain: Secondary | ICD-10-CM | POA: Diagnosis not present

## 2016-04-04 LAB — CBC WITH DIFFERENTIAL/PLATELET
BASOS ABS: 0 10*3/uL (ref 0.0–0.1)
BASOS PCT: 0 %
Eosinophils Absolute: 0.2 10*3/uL (ref 0.0–0.7)
Eosinophils Relative: 2 %
HEMATOCRIT: 42.6 % (ref 36.0–46.0)
HEMOGLOBIN: 14.8 g/dL (ref 12.0–15.0)
Lymphocytes Relative: 32 %
Lymphs Abs: 3.7 10*3/uL (ref 0.7–4.0)
MCH: 28.8 pg (ref 26.0–34.0)
MCHC: 34.7 g/dL (ref 30.0–36.0)
MCV: 83 fL (ref 78.0–100.0)
MONOS PCT: 5 %
Monocytes Absolute: 0.6 10*3/uL (ref 0.1–1.0)
NEUTROS ABS: 7.2 10*3/uL (ref 1.7–7.7)
NEUTROS PCT: 61 %
Platelets: 387 10*3/uL (ref 150–400)
RBC: 5.13 MIL/uL — ABNORMAL HIGH (ref 3.87–5.11)
RDW: 13.3 % (ref 11.5–15.5)
WBC: 11.7 10*3/uL — ABNORMAL HIGH (ref 4.0–10.5)

## 2016-04-04 LAB — COMPREHENSIVE METABOLIC PANEL
ALK PHOS: 97 U/L (ref 38–126)
ALT: 22 U/L (ref 14–54)
ANION GAP: 9 (ref 5–15)
AST: 20 U/L (ref 15–41)
Albumin: 3.7 g/dL (ref 3.5–5.0)
BUN: 14 mg/dL (ref 6–20)
CALCIUM: 9.2 mg/dL (ref 8.9–10.3)
CO2: 26 mmol/L (ref 22–32)
Chloride: 103 mmol/L (ref 101–111)
Creatinine, Ser: 0.85 mg/dL (ref 0.44–1.00)
GFR calc non Af Amer: >60 mL/min (ref >60)
Glucose, Bld: 225 mg/dL — ABNORMAL HIGH (ref 65–99)
Potassium: 3.3 mmol/L — ABNORMAL LOW (ref 3.5–5.1)
SODIUM: 138 mmol/L (ref 135–145)
Total Bilirubin: 0.4 mg/dL (ref 0.3–1.2)
Total Protein: 7.6 g/dL (ref 6.5–8.1)

## 2016-04-04 LAB — CK: CK TOTAL: 294 U/L — AB (ref 38–234)

## 2016-04-04 LAB — MAGNESIUM: Magnesium: 1.8 mg/dL (ref 1.7–2.4)

## 2016-04-04 MED ORDER — SODIUM CHLORIDE 0.9 % IV SOLN
1000.0000 mL | Freq: Once | INTRAVENOUS | Status: AC
  Administered 2016-04-04: 1000 mL via INTRAVENOUS

## 2016-04-04 MED ORDER — POTASSIUM CHLORIDE CRYS ER 20 MEQ PO TBCR
40.0000 meq | EXTENDED_RELEASE_TABLET | Freq: Once | ORAL | Status: AC
  Administered 2016-04-04: 40 meq via ORAL
  Filled 2016-04-04: qty 2

## 2016-04-04 MED ORDER — SODIUM CHLORIDE 0.9 % IV SOLN
1000.0000 mL | INTRAVENOUS | Status: DC
  Administered 2016-04-04: 1000 mL via INTRAVENOUS

## 2016-04-04 MED ORDER — POTASSIUM CHLORIDE CRYS ER 20 MEQ PO TBCR: 20.0000 meq | EXTENDED_RELEASE_TABLET | Freq: Two times a day (BID) | ORAL | Status: DC

## 2016-04-04 MED ORDER — SODIUM CHLORIDE 0.9 % IV BOLUS (SEPSIS)
1000.0000 mL | Freq: Once | INTRAVENOUS | Status: AC
  Administered 2016-04-04: 1000 mL via INTRAVENOUS

## 2016-04-04 NOTE — ED Notes (Signed)
Pt drinking at this time. No acute distress noted.

## 2016-04-04 NOTE — ED Provider Notes (Signed)
CSN: [id number]     Arrival date & time 04/04/16  0255 History   First MD Initiated Contact with Patient 04/04/16 0428 AM   Chief Complaint  Patient presents with  . 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 She states it was very hot and she was sweating from 7 PM to about 1:40 AM when she started getting cramping in her legs. She denies nausea, vomiting, chest pain, or feeling dizzy or lightheaded. She states she did get short of breath but she's not short of breath now. She states she does have some epigastric abdominal pain. However it should be noted patient is eating a large bag of [employer] food and drinking a large drink during her interview.  PCP none  History reviewed. No pertinent past medical history. History reviewed. No pertinent past surgical history. Family History  Problem Relation Age of Onset  . Diabetes Mother    Social History  Substance Use Topics  . Smoking status: Current Every Day Smoker -- 0.50 packs/day for 2 years    Types: Cigarettes  . Smokeless tobacco: Never Used  . Alcohol Use: Yes     Comment: occ   employed  OB History    Gravida Para Term Preterm AB TAB SAB Ectopic Multiple Living   1 1 1       1      Review of Systems  All other systems reviewed and are negative.     Allergies  Review of patient's allergies indicates no known allergies.  Home Medications   Prior to Admission medications   Medication Sig Start Date End Date Taking? Authorizing Provider  megestrol (MEGACE) 40 MG tablet 3 tablets a day for 5 days, 2 tablets a day for 5 days then 1 tablet daily 01/04/16  Yes Lazaro ArmsLuther H Eure, MD  potassium chloride SA (K-DUR,KLOR-CON) 20 MEQ tablet Take 1 tablet (20 mEq total) by mouth 2 (two) times daily. 04/04/16   Devoria AlbeIva Leenah Seidner, MD   BP 137/90 mmHg  Pulse 108   Temp(Src) 97.5 F (36.4 C) (Oral)  Resp 18  Ht 5\' 3"  (1.6 m)  Wt 421 lb (190.964 kg)  BMI 74.60 kg/m2  SpO2 100%  LMP 01/13/2016 Physical Exam  Constitutional: She is oriented to person, place, and time. She appears well-developed and well-nourished.  Non-toxic appearance. She does not appear ill. No distress.  HENT:  Head: Normocephalic and atraumatic.  Right Ear: External ear normal.  Left Ear: External ear normal.  Nose: Nose normal. No mucosal edema or rhinorrhea.  Mouth/Throat: Mucous membranes are normal. No dental abscesses or uvula swelling.  Tongue dry  Eyes: Conjunctivae and EOM are normal. Pupils are equal, round, and reactive to light.  Neck: Normal range of motion and full passive range of motion without pain. Neck supple.  Cardiovascular: Normal rate, regular rhythm and normal heart sounds.  Exam reveals no gallop and no friction rub.   No murmur heard. Pulmonary/Chest: Effort normal and breath sounds normal. No respiratory distress. She has no wheezes. She has no rhonchi. She has no rales. She exhibits no tenderness and no crepitus.  Abdominal: Soft. Normal appearance and bowel sounds are normal. She exhibits no distension. There is no tenderness. There is no rebound and no guarding.  Musculoskeletal: Normal range of motion. She exhibits no edema or tenderness.  Moves all extremities well.  Neurological: She is alert and oriented to person, place, and time. She has normal strength. No cranial nerve deficit.  Skin: Skin is warm, dry and intact. No rash noted. No erythema. No pallor.  Psychiatric: She has a normal mood and affect. Her speech is normal and behavior is normal. Her mood appears not anxious.  Nursing note and vitals reviewed.   ED Course  Procedures (including critical care time)  Medications  0.9 %  sodium chloride infusion (0 mLs Intravenous Stopped 04/04/16 0541)    Followed by  0.9 %  sodium chloride infusion (1,000 mLs Intravenous New Bag/Given  04/04/16 0439)  potassium chloride SA (K-DUR,KLOR-CON) CR tablet 40 mEq (40 mEq Oral Given 04/04/16 0528)  sodium chloride 0.9 % bolus 1,000 mL (0 mLs Intravenous Stopped 04/04/16 0643)    Patient was started on IV fluids for probable heat cramps. After reviewing her laboratory results she was given potassium 40 mEq orally.  I rechecked the patient it 6:10 AM. She states she's starting to feel better. Although she states the cramping is gone she now has burning on the bottom of her feet. He states she wore her brand-new steel toed shoes tonight at work. I removed her socks and the soles of her feet are diffusely red. We discussed taking her shoes back to the store and getting fit for her shoes because they did not seem to be fitting properly.  On review of patient's prior CABGs they have all been high. We discussed getting a primary care doctor and getting a fasting blood sugar and A1c to see if she is borderline /diabetic.   Labs Review Results for orders placed or performed during the hospital encounter of 04/04/16  Comprehensive metabolic panel  Result Value Ref Range   Sodium 138 135 - 145 mmol/L   Potassium 3.3 (L) 3.5 - 5.1 mmol/L   Chloride 103 101 - 111 mmol/L   CO2 26 22 - 32 mmol/L   Glucose, Bld 225 (H) 65 - 99 mg/dL   BUN 14 6 - 20 mg/dL   Creatinine, Ser 1.61 0.44 - 1.00 mg/dL   Calcium 9.2 8.9 - 09.6 mg/dL   Total Protein 7.6 6.5 - 8.1 g/dL   Albumin 3.7 3.5 - 5.0 g/dL   AST 20 15 - 41 U/L   ALT 22 14 - 54 U/L   Alkaline Phosphatase 97 38 - 126 U/L   Total Bilirubin 0.4 0.3 - 1.2 mg/dL   GFR calc non Af Amer >60 >60 mL/min   GFR calc Af Amer >60 >60 mL/min   Anion gap 9 5 - 15  CBC with Differential  Result Value Ref Range   WBC 11.7 (H) 4.0 - 10.5 K/uL   RBC 5.13 (H) 3.87 - 5.11 MIL/uL   Hemoglobin 14.8 12.0 - 15.0 g/dL   HCT 04.5 40.9 - 81.1 %   MCV 83.0 78.0 - 100.0 fL   MCH 28.8 26.0 - 34.0 pg   MCHC 34.7 30.0 - 36.0 g/dL   RDW 91.4 78.2 - 95.6 %   Platelets 387  150 - 400 K/uL   Neutrophils Relative % 61 %   Neutro Abs 7.2 1.7 - 7.7 K/uL   Lymphocytes Relative 32 %   Lymphs Abs 3.7 0.7 - 4.0 K/uL   Monocytes Relative 5 %   Monocytes Absolute 0.6 0.1 - 1.0 K/uL   Eosinophils Relative 2 %   Eosinophils Absolute 0.2 0.0 - 0.7 K/uL   Basophils Relative 0 %  Basophils Absolute 0.0 0.0 - 0.1 K/uL  Magnesium  Result Value Ref Range   Magnesium 1.8 1.7 - 2.4 mg/dL  CK  Result Value Ref Range   Total CK 294 (H) 38 - 234 U/L   Laboratory interpretation all normal except Mild leukocytosis, hypokalemia, hyperglycemia however patient had eaten just prior to the blood being drawn.  I have personally reviewed and evaluated these images and lab results as part of my medical decision-making.    MDM   Final diagnoses:  Heat cramps, initial encounter  Hyperglycemia  Hypokalemia   New Prescriptions   POTASSIUM CHLORIDE SA (K-DUR,KLOR-CON) 20 MEQ TABLET    Take 1 tablet (20 mEq total) by mouth 2 (two) times daily.    Plan discharge  Devoria Albe, MD, Concha Pyo, MD 04/04/16 [phone]

## 2016-04-04 NOTE — Discharge Instructions (Signed)
He needs to drink more fluids when you're in a hot environment especially sports drinks which will help keep you from getting dehydrated. Take the potassium pills until gone. Call [facility] to be rechecked to get a fasting blood sugar and A1c done to see if you have diabetes. Meanwhile start looking at the diabetic diet sheet to try to start eating a healthier diet. Please go to a shoe store and have your boots evaluated to try to get a better fit.   Hypokalemia Hypokalemia means that the amount of potassium in the blood is lower than normal.Potassium is a chemical, called an electrolyte, that helps regulate the amount of fluid in the body. It also stimulates muscle contraction and helps nerves function properly.Most of the body's potassium is inside of cells, and only a very small amount is in the blood. Because the amount in the blood is so small, minor changes can be life-threatening. CAUSES  Antibiotics.  Diarrhea or vomiting.  Using laxatives too much, which can cause diarrhea.  Chronic kidney disease.  Water pills (diuretics).  Eating disorders (bulimia).  Low magnesium level.  Sweating a lot. SIGNS AND SYMPTOMS  Weakness.  Constipation.  Fatigue.  Muscle cramps.  Mental confusion.  Skipped heartbeats or irregular heartbeat (palpitations).  Tingling or numbness. DIAGNOSIS  Your health care provider can diagnose hypokalemia with blood tests. In addition to checking your potassium level, your health care provider may also check other lab tests. TREATMENT Hypokalemia can be treated with potassium supplements taken by mouth or adjustments in your current medicines. If your potassium level is very low, you may need to get potassium through a vein (IV) and be monitored in the hospital. A diet high in potassium is also helpful. Foods high in potassium are:  Nuts, such as peanuts and pistachios.  Seeds, such as sunflower seeds and pumpkin seeds.  Peas,  lentils, and lima beans.  Whole grain and bran cereals and breads.  Fresh fruit and vegetables, such as apricots, avocado, bananas, cantaloupe, kiwi, oranges, tomatoes, asparagus, and potatoes.  Orange and tomato juices.  Red meats.  Fruit yogurt. HOME CARE INSTRUCTIONS  Take all medicines as prescribed by your health care provider.  Maintain a healthy diet by including nutritious food, such as fruits, vegetables, nuts, whole grains, and lean meats.  If you are taking a laxative, be sure to follow the directions on the label. SEEK MEDICAL CARE IF:  Your weakness gets worse.  You feel your heart pounding or racing.  You are vomiting or having diarrhea.  You are diabetic and having trouble keeping your blood glucose in the normal range. SEEK IMMEDIATE MEDICAL CARE IF:  You have chest pain, shortness of breath, or dizziness.  You are vomiting or having diarrhea for more than 2 days.  You faint. MAKE SURE YOU:   Understand these instructions.  Will watch your condition.  Will get help right away if you are not doing well or get worse.   This information is not intended to replace advice given to you by your health care provider. Make sure you discuss any questions you have with your health care provider.   Document Released: 11/20/2005 Document Revised: 12/11/2014 Document Reviewed: 05/23/2013 Elsevier Interactive Patient Education [employer] Elsevier Inc.

## 2016-04-04 NOTE — ED Notes (Addendum)
Pt states she worked her first 12 hour shift tonight. After getting off started having cramping & burning in both lower legs.

## 2016-04-04 NOTE — ED Notes (Signed)
Pt states she is having bilateral leg and foot cramps since getting off a 12hr shift about an hr ago.

## 2016-04-05 ENCOUNTER — Encounter (HOSPITAL_COMMUNITY): Payer: Self-pay | Admitting: Emergency Medicine

## 2016-04-05 ENCOUNTER — Emergency Department (HOSPITAL_COMMUNITY)
Admission: EM | Admit: 2016-04-05 | Discharge: 2016-04-05 | Disposition: A | Discharge status: Home / Self Care | Payer: Medicaid Other | Attending: Emergency Medicine | Admitting: Emergency Medicine

## 2016-04-05 DIAGNOSIS — R739 Hyperglycemia, unspecified: Secondary | ICD-10-CM | POA: Diagnosis not present

## 2016-04-05 DIAGNOSIS — R51 Headache: Secondary | ICD-10-CM | POA: Insufficient documentation

## 2016-04-05 DIAGNOSIS — J019 Acute sinusitis, unspecified: Secondary | ICD-10-CM | POA: Diagnosis not present

## 2016-04-05 DIAGNOSIS — H53149 Visual discomfort, unspecified: Secondary | ICD-10-CM | POA: Diagnosis not present

## 2016-04-05 DIAGNOSIS — Z79899 Other long term (current) drug therapy: Secondary | ICD-10-CM | POA: Diagnosis not present

## 2016-04-05 DIAGNOSIS — N390 Urinary tract infection, site not specified: Secondary | ICD-10-CM | POA: Diagnosis not present

## 2016-04-05 DIAGNOSIS — F17210 Nicotine dependence, cigarettes, uncomplicated: Secondary | ICD-10-CM | POA: Insufficient documentation

## 2016-04-05 DIAGNOSIS — J3489 Other specified disorders of nose and nasal sinuses: Secondary | ICD-10-CM

## 2016-04-05 DIAGNOSIS — R519 Headache, unspecified: Secondary | ICD-10-CM

## 2016-04-05 LAB — CBC WITH DIFFERENTIAL/PLATELET
BASOS ABS: 0 10*3/uL (ref 0.0–0.1)
Basophils Relative: 0 %
EOS ABS: 0.2 10*3/uL (ref 0.0–0.7)
EOS PCT: 2 %
HCT: 40 % (ref 36.0–46.0)
Hemoglobin: 13.3 g/dL (ref 12.0–15.0)
LYMPHS ABS: 3.3 10*3/uL (ref 0.7–4.0)
Lymphocytes Relative: 35 %
MCH: 27.8 pg (ref 26.0–34.0)
MCHC: 33.3 g/dL (ref 30.0–36.0)
MCV: 83.7 fL (ref 78.0–100.0)
MONO ABS: 0.5 10*3/uL (ref 0.1–1.0)
Monocytes Relative: 5 %
Neutro Abs: 5.5 10*3/uL (ref 1.7–7.7)
Neutrophils Relative %: 58 %
PLATELETS: 375 10*3/uL (ref 150–400)
RBC: 4.78 MIL/uL (ref 3.87–5.11)
RDW: 13.5 % (ref 11.5–15.5)
WBC: 9.5 10*3/uL (ref 4.0–10.5)

## 2016-04-05 LAB — COMPREHENSIVE METABOLIC PANEL
ALT: 21 U/L (ref 14–54)
AST: 23 U/L (ref 15–41)
Albumin: 3.2 g/dL — ABNORMAL LOW (ref 3.5–5.0)
Alkaline Phosphatase: 87 U/L (ref 38–126)
Anion gap: 8 (ref 5–15)
BILIRUBIN TOTAL: 0.3 mg/dL (ref 0.3–1.2)
BUN: 11 mg/dL (ref 6–20)
CO2: 24 mmol/L (ref 22–32)
CREATININE: 0.76 mg/dL (ref 0.44–1.00)
Calcium: 8.5 mg/dL — ABNORMAL LOW (ref 8.9–10.3)
Chloride: 105 mmol/L (ref 101–111)
GFR calc Af Amer: >60 mL/min (ref >60)
Glucose, Bld: 258 mg/dL — ABNORMAL HIGH (ref 65–99)
Potassium: 3.9 mmol/L (ref 3.5–5.1)
Sodium: 137 mmol/L (ref 135–145)
TOTAL PROTEIN: 6.8 g/dL (ref 6.5–8.1)

## 2016-04-05 LAB — URINE MICROSCOPIC-ADD ON

## 2016-04-05 LAB — URINALYSIS, ROUTINE W REFLEX MICROSCOPIC
BILIRUBIN URINE: NEGATIVE
Glucose, UA: 500 mg/dL — AB
KETONES UR: NEGATIVE mg/dL
Nitrite: NEGATIVE
PROTEIN: NEGATIVE mg/dL
Specific Gravity, Urine: >1.03 — ABNORMAL HIGH (ref 1.005–1.030)
pH: 6 (ref 5.0–8.0)

## 2016-04-05 LAB — CBG MONITORING, ED: Glucose-Capillary: 262 mg/dL — ABNORMAL HIGH (ref 65–99)

## 2016-04-05 LAB — PREGNANCY, URINE: PREG TEST UR: NEGATIVE

## 2016-04-05 MED ORDER — OXYMETAZOLINE HCL 0.05 % NA SOLN: 1.0000 | Freq: Two times a day (BID) | NASAL | Status: DC

## 2016-04-05 MED ORDER — METHYLPREDNISOLONE SODIUM SUCC 125 MG IJ SOLR
125.0000 mg | Freq: Once | INTRAMUSCULAR | Status: AC
  Administered 2016-04-05: 125 mg via INTRAVENOUS
  Filled 2016-04-05: qty 2

## 2016-04-05 MED ORDER — CEPHALEXIN 500 MG PO CAPS: 500.0000 mg | ORAL_CAPSULE | Freq: Two times a day (BID) | ORAL | Status: DC

## 2016-04-05 MED ORDER — LORATADINE 10 MG PO TABS: 10.0000 mg | ORAL_TABLET | Freq: Every day | ORAL | Status: DC

## 2016-04-05 MED ORDER — MOMETASONE FUROATE 50 MCG/ACT NA SUSP
2.0000 | Freq: Every day | NASAL | Status: DC
Start: 2016-04-05 — End: 2017-01-14

## 2016-04-05 MED ORDER — DIPHENHYDRAMINE HCL 50 MG/ML IJ SOLN
INTRAMUSCULAR | Status: AC
Start: 2016-04-05 — End: 2016-04-05
  Administered 2016-04-05: 25 mg via INTRAVENOUS
  Filled 2016-04-05: qty 1

## 2016-04-05 MED ORDER — SODIUM CHLORIDE 0.9 % IV BOLUS (SEPSIS)
1000.0000 mL | Freq: Once | INTRAVENOUS | Status: AC
  Administered 2016-04-05: 1000 mL via INTRAVENOUS

## 2016-04-05 MED ORDER — METOCLOPRAMIDE HCL 5 MG/ML IJ SOLN
10.0000 mg | Freq: Once | INTRAMUSCULAR | Status: AC
  Administered 2016-04-05: 10 mg via INTRAVENOUS
  Filled 2016-04-05: qty 2

## 2016-04-05 MED ORDER — KETOROLAC TROMETHAMINE 30 MG/ML IJ SOLN
30.0000 mg | Freq: Once | INTRAMUSCULAR | Status: AC
  Administered 2016-04-05: 30 mg via INTRAVENOUS
  Filled 2016-04-05: qty 1

## 2016-04-05 MED ORDER — LORATADINE 10 MG PO TABS
10.0000 mg | ORAL_TABLET | Freq: Once | ORAL | Status: AC
  Administered 2016-04-05: 10 mg via ORAL
  Filled 2016-04-05: qty 1

## 2016-04-05 NOTE — ED Provider Notes (Signed)
CSN: [id number]     Arrival date & time 04/05/16  1808 History   First MD Initiated Contact with Patient 04/05/16 1928     Chief Complaint  Patient presents with  . Headache     (Consider location/radiation/quality/duration/timing/severity/associated sxs/prior Treatment) HPI Patient presents with 2 days of frontal headache. This associated with nausea. She has photophobia. Patient with history of previous migraines. States the headache is worsened with bending forward. She has no posterior headache or neck pain. No neck stiffness. No fever or chills. Patient states she has seasonal allergies but takes no medication for this. No focal weakness or numbness. Patient states her blood sugar has been elevated. She's been urinating frequently. Has not followed up with a primary physician. History reviewed. No pertinent past medical history. History reviewed. No pertinent past surgical history. Family History  Problem Relation Age of Onset  . Diabetes Mother    Social History  Substance Use Topics  . Smoking status: Current Every Day Smoker -- 0.50 packs/day for 2 years    Types: Cigarettes  . Smokeless tobacco: Never Used  . Alcohol Use: Yes     Comment: occ   OB History    Gravida Para Term Preterm AB TAB SAB Ectopic Multiple Living   1 1 1       1      Review of Systems  Constitutional: Negative for fever and chills.  HENT: Positive for congestion and sinus pressure. Negative for rhinorrhea and sore throat.   Eyes: Positive for photophobia.  Respiratory: Negative for cough and shortness of breath.   Cardiovascular: Negative for chest pain, palpitations and leg swelling.  Gastrointestinal: Negative for nausea, vomiting and abdominal pain.  Genitourinary: Negative for dysuria, frequency, hematuria and flank pain.  Musculoskeletal: Negative for myalgias, back pain, neck pain and neck stiffness.  Skin: Negative for rash and wound.  Neurological: Positive for headaches. Negative for  dizziness, weakness, light-headedness and numbness.  All other systems reviewed and are negative.     Allergies  Review of patient's allergies indicates no known allergies.  Home Medications   Prior to Admission medications   Medication Sig Start Date End Date Taking? Authorizing Provider  megestrol (MEGACE) 40 MG tablet 3 tablets a day for 5 days, 2 tablets a day for 5 days then 1 tablet daily Patient taking differently: Take 40-120 mg by mouth See admin instructions. 3 tablets a day for 5 days, 2 tablets a day for 5 days then 1 tablet daily 01/04/16  Yes Lazaro ArmsLuther H Eure, MD  potassium chloride SA (K-DUR,KLOR-CON) 20 MEQ tablet Take 1 tablet (20 mEq total) by mouth 2 (two) times daily. 04/04/16  Yes Devoria AlbeIva Knapp, MD  cephALEXin (KEFLEX) 500 MG capsule Take 1 capsule (500 mg total) by mouth 2 (two) times daily. 04/05/16   Loren Raceravid Laelyn Blumenthal, MD  loratadine (CLARITIN) 10 MG tablet Take 1 tablet (10 mg total) by mouth daily. 04/05/16   Loren Raceravid Inge Waldroup, MD  mometasone (NASONEX) 50 MCG/ACT nasal spray Place 2 sprays into the nose daily. 04/05/16   Loren Raceravid Nahjae Hoeg, MD  oxymetazoline (AFRIN NASAL SPRAY) 0.05 % nasal spray Place 1 spray into both nostrils 2 (two) times daily. 04/05/16   Loren Raceravid Danyael Alipio, MD   BP 140/63 mmHg  Pulse 98  Temp(Src) 98.1 F (36.7 C) (Oral)  Resp 18  Ht 5\' 3"  (1.6 m)  Wt 421 lb (190.964 kg)  BMI 74.60 kg/m2  SpO2 99%  LMP 01/13/2016 Physical Exam  Constitutional: She is oriented to person, place,  and time. She appears well-developed and well-nourished. No distress.  HENT:  Head: Normocephalic and atraumatic.  Mouth/Throat: Oropharynx is clear and moist.  Bilateral nasal mucosal edema. Patient does have tenderness to percussion bilateral maxillary sinuses.  Eyes: EOM are normal. Pupils are equal, round, and reactive to light.  Neck: Normal range of motion. Neck supple.  No meningismus or cervical adenopathy  Cardiovascular: Normal rate and regular rhythm.  Exam reveals no  gallop and no friction rub.   No murmur heard. Pulmonary/Chest: Effort normal and breath sounds normal. No respiratory distress. She has no wheezes. She has no rales. She exhibits no tenderness.  Abdominal: Soft. Bowel sounds are normal. She exhibits no distension and no mass. There is no tenderness. There is no rebound and no guarding.  Musculoskeletal: Normal range of motion. She exhibits no edema or tenderness.  No lower extremity swelling or pain.  Neurological: She is alert and oriented to person, place, and time.  Patient is alert and oriented x3 with clear, goal oriented speech. Patient has 5/5 motor in all extremities. Sensation is intact to light touch. Patient has a normal gait and walks without assistance.  Skin: Skin is warm and dry. No rash noted. No erythema.  Psychiatric: She has a normal mood and affect. Her behavior is normal.  Nursing note and vitals reviewed.   ED Course  Procedures (including critical care time) Labs Review Labs Reviewed  COMPREHENSIVE METABOLIC PANEL - Abnormal; Notable for the following:    Glucose, Bld 258 (*)    Calcium 8.5 (*)    Albumin 3.2 (*)    All other components within normal limits  URINALYSIS, ROUTINE W REFLEX MICROSCOPIC (NOT AT [facility]) - Abnormal; Notable for the following:    Specific Gravity, Urine >1.030 (*)    Glucose, UA 500 (*)    Hgb urine dipstick SMALL (*)    Leukocytes, UA SMALL (*)    All other components within normal limits  URINE MICROSCOPIC-ADD ON - Abnormal; Notable for the following:    Squamous Epithelial / LPF 6-30 (*)    Bacteria, UA MANY (*)    All other components within normal limits  CBG MONITORING, ED - Abnormal; Notable for the following:    Glucose-Capillary 262 (*)    All other components within normal limits  CBC WITH DIFFERENTIAL/PLATELET  PREGNANCY, URINE    Imaging Review No results found. I have personally reviewed and evaluated these images and lab results as part of my medical  decision-making.   EKG Interpretation None      MDM   Final diagnoses:  Sinus pressure  Acute nonintractable headache, unspecified headache type  Hyperglycemia  UTI (lower urinary tract infection)    Likely sinus pressure and possible migraine features. Patient with elevated blood sugar. Dehydration may play a role. Will check basic labs, IV fluids and treat headache.  Patient had mild rash and lower lip swelling after IV meds. No airway compromise. Will give IV Benadryl.  Itching and rash have improved. She states headache is resolved. Evidence of UTI on UA. Start on antibiotics. Advised to establish care with primary physician. Return precautions given.  Loren Racer, MD 04/05/16 2322

## 2016-04-05 NOTE — ED Notes (Signed)
Pt sitting up in chair in room, requested something to drink, ginger ale provided

## 2016-04-05 NOTE — ED Notes (Signed)
Pt reports headache, high blood sugar x2 days. Pt reports new onset hyperglycemia. Pt denies n/v/d.

## 2016-04-05 NOTE — Discharge Instructions (Signed)
Establish care with a primary physician to follow up on elevations in your blood sugar. Make sure you drinking plenty of fluids.   Hyperglycemia Hyperglycemia occurs when the glucose (sugar) in your blood is too high. Hyperglycemia can happen for many reasons, but it most often happens to people who do not know they have diabetes or are not managing their diabetes properly.  CAUSES  Whether you have diabetes or not, there are other causes of hyperglycemia. Hyperglycemia can occur when you have diabetes, but it can also occur in other situations that you might not be as aware of, such as: Diabetes  If you have diabetes and are having problems controlling your blood glucose, hyperglycemia could occur because of some of the following reasons:  Not following your meal plan.  Not taking your diabetes medications or not taking it properly.  Exercising less or doing less activity than you normally do.  Being sick. Pre-diabetes  This cannot be ignored. Before people develop Type 2 diabetes, they almost always have "pre-diabetes." This is when your blood glucose levels are higher than normal, but not yet high enough to be diagnosed as diabetes. Research has shown that some long-term damage to the body, especially the heart and circulatory system, may already be occurring during pre-diabetes. If you take action to manage your blood glucose when you have pre-diabetes, you may delay or prevent Type 2 diabetes from developing. Stress  If you have diabetes, you may be "diet" controlled or on oral medications or insulin to control your diabetes. However, you may find that your blood glucose is higher than usual in the hospital whether you have diabetes or not. This is often referred to as "stress hyperglycemia." Stress can elevate your blood glucose. This happens because of hormones put out by the body during times of stress. If stress has been the cause of your high blood glucose, it can be followed  regularly by your caregiver. That way he/she can make sure your hyperglycemia does not continue to get worse or progress to diabetes. Steroids  Steroids are medications that act on the infection fighting system (immune system) to block inflammation or infection. One side effect can be a rise in blood glucose. Most people can produce enough extra insulin to allow for this rise, but for those who cannot, steroids make blood glucose levels go even higher. It is not unusual for steroid treatments to "uncover" diabetes that is developing. It is not always possible to determine if the hyperglycemia will go away after the steroids are stopped. A special blood test called an A1c is sometimes done to determine if your blood glucose was elevated before the steroids were started. SYMPTOMS  Thirsty.  Frequent urination.  Dry mouth.  Blurred vision.  Tired or fatigue.  Weakness.  Sleepy.  Tingling in feet or leg. DIAGNOSIS  Diagnosis is made by monitoring blood glucose in one or all of the following ways:  A1c test. This is a chemical found in your blood.  Fingerstick blood glucose monitoring.  Laboratory results. TREATMENT  First, knowing the cause of the hyperglycemia is important before the hyperglycemia can be treated. Treatment may include, but is not be limited to:  Education.  Change or adjustment in medications.  Change or adjustment in meal plan.  Treatment for an illness, infection, etc.  More frequent blood glucose monitoring.  Change in exercise plan.  Decreasing or stopping steroids.  Lifestyle changes. HOME CARE INSTRUCTIONS   Test your blood glucose as directed.  Exercise  regularly. Your caregiver will give you instructions about exercise. Pre-diabetes or diabetes which comes on with stress is helped by exercising.  Eat wholesome, balanced meals. Eat often and at regular, fixed times. Your caregiver or nutritionist will give you a meal plan to guide your sugar  intake.  Being at an ideal weight is important. If needed, losing as little as 10 to 15 pounds may help improve blood glucose levels. SEEK MEDICAL CARE IF:   You have questions about medicine, activity, or diet.  You continue to have symptoms (problems such as increased thirst, urination, or weight gain). SEEK IMMEDIATE MEDICAL CARE IF:   You are vomiting or have diarrhea.  Your breath smells fruity.  You are breathing faster or slower.  You are very sleepy or incoherent.  You have numbness, tingling, or pain in your feet or hands.  You have chest pain.  Your symptoms get worse even though you have been following your caregiver's orders.  If you have any other questions or concerns.   This information is not intended to replace advice given to you by your health care provider. Make sure you discuss any questions you have with your health care provider.   Document Released: 05/16/2001 Document Revised: 02/12/2012 Document Reviewed: 07/27/2015 Elsevier Interactive Patient Education 2016 Else[employer] Inc.  Sinus Headache A sinus headache occurs when the paranasal sinuses become clogged or swollen. Paranasal sinuses are air pockets within the bones of the face. Sinus headaches can range from mild to severe. CAUSES A sinus headache can result from various conditions that affect the sinuses, such as:  Colds.  Sinus infections.  Allergies. SYMPTOMS The main symptom of this condition is a headache that may feel like pain or pressure in the face, forehead, ears, or upper teeth. People who have a sinus headache often have other symptoms, such as:  Congested or runny nose.  Fever.  Inability to smell. Weather changes can make symptoms worse. DIAGNOSIS This condition may be diagnosed based on:  A physical exam and medical history.  Imaging tests, such as a CT scan and MRI, to check for problems with the sinuses.  A specialist may look into the sinuses with a tool that has a  camera (endoscopy). TREATMENT Treatment for this condition depends on the cause.  Sinus pain that is caused by a sinus infection may be treated with antibiotic medicine.  Sinus pain that is caused by allergies may be helped by allergy medicines (antihistamines) and medicated nasal sprays.  Sinus pain that is caused by congestion may be helped by flushing the nose and sinuses with saline solution. HOME CARE INSTRUCTIONS  Take medicines only as directed by your health care provider.  If you were prescribed an antibiotic medicine, finish all of it even if you start to feel better.  If you have congestion, use a nasal spray to help reduce pressure.  If directed, apply a warm, moist washcloth to your face to help relieve pain. SEEK MEDICAL CARE IF:  You have headaches more than one time each week.  You have sensitivity to light or sound.  You have a fever.  You feel sick to your stomach (nauseous) or you throw up (vomit).  Your headaches do not get better with treatment. Many people think that they have a sinus headache when they actually have migraines or tension headaches. SEEK IMMEDIATE MEDICAL CARE IF:  You have vision problems.  You have sudden, severe pain in your face or head.  You have a seizure.  You are confused.  You have a stiff neck.   This information is not intended to replace advice given to you by your health care provider. Make sure you discuss any questions you have with your health care provider.   Document Released: 12/28/2004 Document Revised: 04/06/2015 Document Reviewed: 11/16/2014 Elsevier Interactive Patient Education [employer]

## 2016-04-05 NOTE — ED Notes (Signed)
The few hives to face are starting to diminish, pt denies any sob or trouble swallowing

## 2016-04-05 NOTE — ED Notes (Signed)
After giving IV meds pt began to get sudden nausea and vomited x 1, pt stated that she began to feel better but began to get small hives to each side of cheeks, pt denies any SOB or difficultly breathing, EDP made aware and verbal order of IV benadryl 25mg , which has been given

## 2016-04-05 NOTE — ED Notes (Signed)
Patient can not use the bathroom. She said she will call when she can use the restroom

## 2016-04-05 NOTE — ED Notes (Signed)
POC cbg in triage 262.

## 2016-04-29 ENCOUNTER — Encounter (HOSPITAL_COMMUNITY): Payer: Self-pay | Admitting: *Deleted

## 2016-04-29 DIAGNOSIS — Z79899 Other long term (current) drug therapy: Secondary | ICD-10-CM | POA: Diagnosis not present

## 2016-04-29 DIAGNOSIS — E1165 Type 2 diabetes mellitus with hyperglycemia: Secondary | ICD-10-CM | POA: Diagnosis not present

## 2016-04-29 DIAGNOSIS — R51 Headache: Secondary | ICD-10-CM | POA: Diagnosis not present

## 2016-04-29 DIAGNOSIS — Z7984 Long term (current) use of oral hypoglycemic drugs: Secondary | ICD-10-CM | POA: Diagnosis not present

## 2016-04-29 DIAGNOSIS — Z5321 Procedure and treatment not carried out due to patient leaving prior to being seen by health care provider: Secondary | ICD-10-CM | POA: Insufficient documentation

## 2016-04-29 DIAGNOSIS — F17210 Nicotine dependence, cigarettes, uncomplicated: Secondary | ICD-10-CM | POA: Insufficient documentation

## 2016-04-29 LAB — CBG MONITORING, ED: Glucose-Capillary: 314 mg/dL — ABNORMAL HIGH (ref 65–99)

## 2016-04-29 NOTE — ED Notes (Signed)
Pt not in lobby X1,

## 2016-04-29 NOTE — ED Notes (Addendum)
Pt c/o dizziness, headache and elevated blood sugar that started today, pt reports that her headache is the same as her previous headaches,

## 2016-04-30 ENCOUNTER — Emergency Department (HOSPITAL_COMMUNITY)
Admission: EM | Admit: 2016-04-30 | Discharge: 2016-04-30 | Disposition: A | Discharge status: Home / Self Care | Payer: Medicaid Other | Attending: Dermatology | Admitting: Dermatology

## 2016-04-30 HISTORY — DX: Type 2 diabetes mellitus without complications: E11.9

## 2016-04-30 NOTE — ED Notes (Signed)
Called to the back, not in lobby

## 2016-04-30 NOTE — ED Notes (Signed)
No answer in waiting room X3,  

## 2016-09-06 ENCOUNTER — Emergency Department (HOSPITAL_COMMUNITY)
Admission: EM | Admit: 2016-09-06 | Discharge: 2016-09-06 | Disposition: A | Discharge status: Home / Self Care | Payer: Medicaid Other | Attending: Emergency Medicine | Admitting: Emergency Medicine

## 2016-09-06 ENCOUNTER — Emergency Department (HOSPITAL_COMMUNITY): Payer: Medicaid Other

## 2016-09-06 ENCOUNTER — Encounter (HOSPITAL_COMMUNITY): Payer: Self-pay | Admitting: Emergency Medicine

## 2016-09-06 DIAGNOSIS — R918 Other nonspecific abnormal finding of lung field: Secondary | ICD-10-CM | POA: Diagnosis not present

## 2016-09-06 DIAGNOSIS — E119 Type 2 diabetes mellitus without complications: Secondary | ICD-10-CM | POA: Insufficient documentation

## 2016-09-06 DIAGNOSIS — Z7984 Long term (current) use of oral hypoglycemic drugs: Secondary | ICD-10-CM | POA: Insufficient documentation

## 2016-09-06 DIAGNOSIS — F17210 Nicotine dependence, cigarettes, uncomplicated: Secondary | ICD-10-CM | POA: Insufficient documentation

## 2016-09-06 DIAGNOSIS — R05 Cough: Secondary | ICD-10-CM

## 2016-09-06 DIAGNOSIS — R059 Cough, unspecified: Secondary | ICD-10-CM

## 2016-09-06 MED ORDER — DOXYCYCLINE HYCLATE 100 MG PO CAPS: 100.0000 mg | ORAL_CAPSULE | Freq: Two times a day (BID) | ORAL | 0 refills | Status: AC

## 2016-09-06 NOTE — Discharge Instructions (Signed)
Follow up with a primary care doctor to be rechecked in 1 week, you can take over the counter cough medications as needed, the xray showed the possibility of a mild pneumonia, the doxycycline is for 7 days

## 2016-09-06 NOTE — ED Provider Notes (Signed)
[facility]-EMERGENCY DEPT Provider Note   CSN: [id number] Arrival date & time: 09/06/16  0755     History   Chief Complaint Chief Complaint  Patient presents with  . Cough    HPI Selena Barr is a 25 y.o. female.  HPI The patient presents to the emergency room for evaluation of a cough and congestion. Symptoms started about 2 days ago. She has been coughing up yellow sputum. She started having some pain in her chest with coughing. She denies any fevers or shortness of breath. She has a sore throat. She has no difficulty swallowing. No vomiting or diarrhea.  Past Medical History:  Diagnosis Date  . Diabetes mellitus without complication [facility](HCC)     Patient Active Problem List   Diagnosis Date Noted  . Allergic rhinitis 03/16/2014  . Seasonal allergies 04/16/2013  . Sinusitis, acute 04/16/2013    History reviewed. No pertinent surgical history.  OB History    Gravida Para Term Preterm AB Living   1 1 1     1    SAB TAB Ectopic Multiple Live Births                   Home Medications    Prior to Admission medications   Medication Sig Start Date End Date Taking? Authorizing Provider  cephALEXin (KEFLEX) 500 MG capsule Take 1 capsule (500 mg total) by mouth 2 (two) times daily. 04/05/16   Loren Raceravid Yelverton, MD  doxycycline (VIBRAMYCIN) 100 MG capsule Take 1 capsule (100 mg total) by mouth 2 (two) times daily. 09/06/16 09/13/16  Linwood DibblesJon Saori Umholtz, MD  loratadine (CLARITIN) 10 MG tablet Take 1 tablet (10 mg total) by mouth daily. 04/05/16   Loren Raceravid Yelverton, MD  megestrol (MEGACE) 40 MG tablet 3 tablets a day for 5 days, 2 tablets a day for 5 days then 1 tablet daily Patient taking differently: Take 40-120 mg by mouth See admin instructions. 3 tablets a day for 5 days, 2 tablets a day for 5 days then 1 tablet daily 01/04/16   Lazaro ArmsLuther H Eure, MD  metFORMIN (GLUCOPHAGE) 500 MG tablet Take 500 mg by mouth 2 (two) times daily with a meal.    Historical Provider, MD  mometasone (NASONEX) 50 MCG/ACT  nasal spray Place 2 sprays into the nose daily. 04/05/16   Loren Raceravid Yelverton, MD  oxymetazoline (AFRIN NASAL SPRAY) 0.05 % nasal spray Place 1 spray into both nostrils 2 (two) times daily. 04/05/16   Loren Raceravid Yelverton, MD  potassium chloride SA (K-DUR,KLOR-CON) 20 MEQ tablet Take 1 tablet (20 mEq total) by mouth 2 (two) times daily. 04/04/16   Devoria AlbeIva Deosha Werden, MD    Family History Family History  Problem Relation Age of Onset  . Diabetes Mother     Social History Social History  Substance Use Topics  . Smoking status: Current Every Day Smoker    Packs/day: 0.50    Years: 2.00    Types: Cigarettes  . Smokeless tobacco: Never Used  . Alcohol use Yes     Comment: occ     Allergies   Solu-medrol [methylprednisolone acetate]   Review of Systems Review of Systems  All other systems reviewed and are negative.    Physical Exam Updated Vital Signs BP 133/87   Pulse 95   Temp 98.1 F (36.7 C) (Oral)   Resp 18   Ht 5\' 3"  (1.6 m)   Wt (!) 148.3 kg   SpO2 100%   BMI 57.93 kg/m   Physical Exam  Constitutional: No  distress.  Overweight  HENT:  Head: Normocephalic and atraumatic.  Right Ear: External ear normal.  Left Ear: External ear normal.  Eyes: Conjunctivae are normal. Right eye exhibits no discharge. Left eye exhibits no discharge. No scleral icterus.  Neck: Neck supple. No tracheal deviation present.  Cardiovascular: Normal rate, regular rhythm and intact distal pulses.   Pulmonary/Chest: Effort normal and breath sounds normal. No stridor. No respiratory distress. She has no wheezes. She has no rales.  Abdominal: Soft. Bowel sounds are normal. She exhibits no distension. There is no tenderness. There is no rebound and no guarding.  Musculoskeletal: She exhibits no edema or tenderness.  Neurological: She is alert. She has normal strength. No cranial nerve deficit (no facial droop, extraocular movements intact, no slurred speech) or sensory deficit. She exhibits normal muscle tone.  She displays no seizure activity. Coordination normal.  Skin: Skin is warm and dry. No rash noted. She is not diaphoretic.  Psychiatric: She has a normal mood and affect.  Nursing note and vitals reviewed.    ED Treatments / Results  Labs (all labs ordered are listed, but only abnormal results are displayed) Labs Reviewed - No data to display  EKG  EKG Interpretation None       Radiology Dg Chest 2 View  Result Date: 09/06/2016 CLINICAL DATA:  Productive cough. EXAM: CHEST  2 VIEW COMPARISON:  09/15/2014. FINDINGS: Mediastinum and hilar structures normal. Low lung volumes mild lingular infiltrate cannot be excluded No pleural effusion or pneumothorax. IMPRESSION: Low lung volumes.  Mild lingular infiltrate cannot be excluded. Electronically Signed   By: Maisie Fus  Register   On: 09/06/2016 08:41    Procedures Procedures (including critical care time)  Medications Ordered in ED Medications - No data to display   Initial Impression / Assessment and Plan / ED Course  I have reviewed the triage vital signs and the nursing notes.  Pertinent labs & imaging results that were available during my care of the patient were reviewed by me and considered in my medical decision making (see chart for details).  Clinical Course    Chest x-ray shows a possible pneumonia. This certainly could be atelectasis however the patient does have a cough and complaints of chest pain.  She otherwise appears well. She is afebrile, not hypoxic and nontoxic.  Plan on discharge home with prescription for doxycycline. Follow up with primary care doctor in 1 week to be rechecked.  Final Clinical Impressions(s) / ED Diagnoses   Final diagnoses:  Cough  Lung infiltrate    New Prescriptions New Prescriptions   DOXYCYCLINE (VIBRAMYCIN) 100 MG CAPSULE    Take 1 capsule (100 mg total) by mouth 2 (two) times daily.     Linwood Dibbles, MD 09/06/16 [phone]

## 2016-09-06 NOTE — ED Triage Notes (Signed)
Pt reports cough,sore throat x2 days. nad noted.

## 2016-09-22 ENCOUNTER — Ambulatory Visit (INDEPENDENT_AMBULATORY_CARE_PROVIDER_SITE_OTHER): Payer: Medicaid Other | Admitting: Family Medicine

## 2016-09-22 ENCOUNTER — Encounter: Payer: Self-pay | Admitting: Family Medicine

## 2016-09-22 VITALS — BP 130/74 | HR 80 | Temp 98.9°F | Resp 16 | Ht 63.75 in | Wt 333.0 lb

## 2016-09-22 DIAGNOSIS — E119 Type 2 diabetes mellitus without complications: Secondary | ICD-10-CM | POA: Insufficient documentation

## 2016-09-22 DIAGNOSIS — E1169 Type 2 diabetes mellitus with other specified complication: Secondary | ICD-10-CM

## 2016-09-22 DIAGNOSIS — Z720 Tobacco use: Secondary | ICD-10-CM | POA: Insufficient documentation

## 2016-09-22 DIAGNOSIS — Z7689 Persons encountering health services in other specified circumstances: Secondary | ICD-10-CM

## 2016-09-22 DIAGNOSIS — F17200 Nicotine dependence, unspecified, uncomplicated: Secondary | ICD-10-CM | POA: Insufficient documentation

## 2016-09-22 DIAGNOSIS — E669 Obesity, unspecified: Secondary | ICD-10-CM

## 2016-09-22 DIAGNOSIS — N938 Other specified abnormal uterine and vaginal bleeding: Secondary | ICD-10-CM | POA: Insufficient documentation

## 2016-09-22 HISTORY — DX: Tobacco use: Z72.0

## 2016-09-22 MED ORDER — METFORMIN HCL 500 MG PO TABS: 500.0000 mg | ORAL_TABLET | Freq: Two times a day (BID) | ORAL | 2 refills | Status: DC

## 2016-09-22 NOTE — Patient Instructions (Signed)
Try to cut down on the smoking We will work on stopping  Take the metformin twice a day with food This is for diabetes Check your fasting sugar once a day Keep track - write them down  Flu shot today  See me in a month  You need lab tests You can get them prior to the next appointment.

## 2016-09-22 NOTE — Progress Notes (Signed)
Chief Complaint  Patient presents with  . Establish Care   New to establish Recent diagnosis of DM No education Not taking meds Not checking sugars Discussed DM management and diet. No reg exercise  Is morbidly obese.  BMI over 57.  Has tried to diet unsuccessfully.  Not active.  Says legs hurt when she tries to walk. We discussed health risks of her size and the contribution of her obesity to DM and other issues.  She is open to the idea of bariatric surgery and information is given  I have discussed the multiple health risks associated with cigarette smoking including, but not limited to, cardiovascular disease, lung disease and cancer.  I have strongly recommended that smoking be stopped.  I have reviewed the various methods of quitting including cold [place], classes, nicotine replacements and prescription medications.  I have offered assistance in this difficult process.  The patient is not interested in assistance at this time.  she will try to cut down.  Up to date with PAP Agrees to flu shot     Patient Active Problem List   Diagnosis Date Noted  . Diabetes mellitus type 2 in obese (HCC) 09/22/2016  . Morbid obesity (HCC) 09/22/2016  . DUB (dysfunctional uterine bleeding) 09/22/2016  . Tobacco abuse 09/22/2016  . Allergic rhinitis 03/16/2014  . Seasonal allergies 04/16/2013    Outpatient Encounter Prescriptions as of 09/22/2016  Medication Sig  . loratadine (CLARITIN) 10 MG tablet Take 1 tablet (10 mg total) by mouth daily.  . megestrol (MEGACE) 40 MG tablet 3 tablets a day for 5 days, 2 tablets a day for 5 days then 1 tablet daily (Patient taking differently: Take 40-120 mg by mouth See admin instructions. 3 tablets a day for 5 days, 2 tablets a day for 5 days then 1 tablet daily)  . mometasone (NASONEX) 50 MCG/ACT nasal spray Place 2 sprays into the nose daily.  . [DISCONTINUED] oxymetazoline (AFRIN NASAL SPRAY) 0.05 % nasal spray Place 1 spray into both nostrils 2  (two) times daily.  Marland Kitchen ACCU-CHEK AVIVA PLUS test strip USE TO CHECK FASTING BLOOD SUGAR ONCE QAM  . ACCU-CHEK SOFTCLIX LANCETS lancets USE AS DIRECTED TO CHECK FASTING BLOOD SUGAR ONCE DAILY IN THE MORNING  . metFORMIN (GLUCOPHAGE) 500 MG tablet Take 1 tablet (500 mg total) by mouth 2 (two) times daily with a meal.   No facility-administered encounter medications on file as of 09/22/2016.     Past Medical History:  Diagnosis Date  . Allergy   . Diabetes mellitus without complication (HCC)   . Tobacco abuse 09/22/2016    No past surgical history on file.  Social History   Social History  . Marital status: Single    Spouse name: N/A  . Number of children: 1  . Years of education: 36   Occupational History  .      unemployed   Social History Main Topics  . Smoking status: Current Every Day Smoker    Packs/day: 0.50    Years: 2.00    Types: Cigarettes  . Smokeless tobacco: Never Used  . Alcohol use No  . Drug use: No     Comment: denies use 08/28/14  . Sexual activity: Yes    Birth control/ protection: Implant   Other Topics Concern  . Not on file   Social History Narrative   Lives with mother and brother, sister, daughter    Family History  Problem Relation Age of Onset  . Diabetes Mother   .  Alcohol abuse Mother   . COPD Mother   . Drug abuse Mother   . Cancer Maternal Grandmother     throat  . Cancer Maternal Grandfather     throat  . Diabetes Maternal Grandfather     Review of Systems  Constitutional: Negative for chills, fever and weight loss.  HENT: Negative for congestion and hearing loss.   Eyes: Negative for blurred vision and pain.  Respiratory: Negative for cough and shortness of breath.   Cardiovascular: Negative for chest pain and leg swelling.  Gastrointestinal: Negative for abdominal pain, constipation, diarrhea and heartburn.  Genitourinary: Positive for frequency. Negative for dysuria.  Musculoskeletal: Negative for falls, joint pain and  myalgias.       Calf cramping with exercise  Neurological: Negative for dizziness, seizures and headaches.  Endo/Heme/Allergies: Positive for environmental allergies.  Psychiatric/Behavioral: Negative for depression. The patient is not nervous/anxious and does not have insomnia.     BP 130/74 (BP Location: Right Arm, Patient Position: Sitting, Cuff Size: Large)   Pulse 80   Temp 98.9 F (37.2 C) (Oral)   Resp 16   Ht 5' 3.75" (1.619 m)   Wt (!) 333 lb (151 kg)   SpO2 99%   BMI 57.61 kg/m   Physical Exam  Constitutional: She is oriented to person, place, and time. She appears well-developed and well-nourished.  superobese  HENT:  Head: Normocephalic and atraumatic.  Right Ear: External ear normal.  Left Ear: External ear normal.  Mouth/Throat: Oropharynx is clear and moist.  Eyes: Conjunctivae are normal. Pupils are equal, round, and reactive to light.  Neck: Normal range of motion. Neck supple. No thyromegaly present.  Cardiovascular: Normal rate, regular rhythm and normal heart sounds.   Pulmonary/Chest: Effort normal and breath sounds normal. No respiratory distress.  Abdominal: Soft. Bowel sounds are normal.  Musculoskeletal: Normal range of motion. She exhibits no edema.  Lymphadenopathy:    She has no cervical adenopathy.  Neurological: She is alert and oriented to person, place, and time. She has normal reflexes.  Gait normal  Skin: Skin is warm and dry.  Psychiatric: She has a normal mood and affect. Her behavior is normal. Thought content normal.  Nursing note and vitals reviewed.  ASSESSMENT/PLAN:  1. Diabetes mellitus type 2 in obese (HCC)  - CBC - Comprehensive metabolic panel - Hemoglobin A1c - Lipid panel - TSH - VITAMIN D 25 Hydroxy (Vit-D Deficiency, Fractures) - Microalbumin, urine - Ambulatory referral to diabetic education  2.  Morbid obesity (HCC) Discussed diet, exercise, surgery  3. Tobacco use disorder Advised to quit - counseled  4.  Encounter to establish care with new doctor  5. DUB (dysfunctional uterine bleeding) Under care GYN with occasional use of megace      Patient Instructions  Try to cut down on the smoking We will work on stopping  Take the metformin twice a day with food This is for diabetes Check your fasting sugar once a day Keep track - write them down  Flu shot today  See me in a month  You need lab tests You can get them prior to the next appointment.  60 min was spent with this patient in new diabetes ed, diet and exercise ed, smoking cessation advice  Eustace Moore, MD

## 2016-10-18 ENCOUNTER — Ambulatory Visit: Payer: Medicaid Other | Admitting: Nutrition

## 2016-10-24 ENCOUNTER — Encounter: Payer: Self-pay | Admitting: Family Medicine

## 2016-10-24 ENCOUNTER — Ambulatory Visit (INDEPENDENT_AMBULATORY_CARE_PROVIDER_SITE_OTHER): Payer: Medicaid Other | Admitting: Family Medicine

## 2016-10-24 VITALS — BP 136/80 | HR 100 | Temp 97.6°F | Resp 18 | Ht 63.75 in | Wt 342.1 lb

## 2016-10-24 DIAGNOSIS — E559 Vitamin D deficiency, unspecified: Secondary | ICD-10-CM

## 2016-10-24 DIAGNOSIS — E785 Hyperlipidemia, unspecified: Secondary | ICD-10-CM | POA: Diagnosis not present

## 2016-10-24 DIAGNOSIS — E1169 Type 2 diabetes mellitus with other specified complication: Secondary | ICD-10-CM

## 2016-10-24 DIAGNOSIS — E782 Mixed hyperlipidemia: Secondary | ICD-10-CM | POA: Insufficient documentation

## 2016-10-24 DIAGNOSIS — E669 Obesity, unspecified: Secondary | ICD-10-CM

## 2016-10-24 HISTORY — DX: Hyperlipidemia, unspecified: E78.5

## 2016-10-24 LAB — CBC
HEMATOCRIT: 43.1 % (ref 35.0–45.0)
HEMOGLOBIN: 13.9 g/dL (ref 11.7–15.5)
MCH: 28.3 pg (ref 27.0–33.0)
MCHC: 32.3 g/dL (ref 32.0–36.0)
MCV: 87.6 fL (ref 80.0–100.0)
MPV: 9.5 fL (ref 7.5–12.5)
Platelets: 450 10*3/uL — ABNORMAL HIGH (ref 140–400)
RBC: 4.92 MIL/uL (ref 3.80–5.10)
RDW: 13.6 % (ref 11.0–15.0)
WBC: 10.4 10*3/uL (ref 3.8–10.8)

## 2016-10-24 LAB — LIPID PANEL
CHOL/HDL RATIO: 6.4 ratio — AB (ref <5.0)
CHOLESTEROL: 173 mg/dL (ref <200)
HDL: 27 mg/dL — AB (ref >50)
LDL Cholesterol: 123 mg/dL — ABNORMAL HIGH (ref <100)
TRIGLYCERIDES: 113 mg/dL (ref <150)
VLDL: 23 mg/dL (ref <30)

## 2016-10-24 LAB — COMPREHENSIVE METABOLIC PANEL
ALBUMIN: 3.6 g/dL (ref 3.6–5.1)
ALT: 17 U/L (ref 6–29)
AST: 16 U/L (ref 10–30)
Alkaline Phosphatase: 80 U/L (ref 33–115)
BUN: 9 mg/dL (ref 7–25)
CALCIUM: 9.1 mg/dL (ref 8.6–10.2)
CHLORIDE: 106 mmol/L (ref 98–110)
CO2: 29 mmol/L (ref 20–31)
Creat: 0.67 mg/dL (ref 0.50–1.10)
GLUCOSE: 137 mg/dL — AB (ref 65–99)
POTASSIUM: 4.3 mmol/L (ref 3.5–5.3)
Sodium: 142 mmol/L (ref 135–146)
Total Bilirubin: 0.5 mg/dL (ref 0.2–1.2)
Total Protein: 6.9 g/dL (ref 6.1–8.1)

## 2016-10-24 LAB — HEMOGLOBIN A1C
Hgb A1c MFr Bld: 8.3 % — ABNORMAL HIGH (ref <5.7)
Mean Plasma Glucose: 192 mg/dL

## 2016-10-24 LAB — VITAMIN D 25 HYDROXY (VIT D DEFICIENCY, FRACTURES): VIT D 25 HYDROXY: 14 ng/mL — AB (ref 30–100)

## 2016-10-24 LAB — TSH: TSH: 0.63 mIU/L

## 2016-10-24 LAB — MICROALBUMIN, URINE: Microalb, Ur: 0.7 mg/dL

## 2016-10-24 MED ORDER — METFORMIN HCL 1000 MG PO TABS: 1000.0000 mg | ORAL_TABLET | Freq: Two times a day (BID) | ORAL | 2 refills | Status: DC

## 2016-10-24 MED ORDER — VITAMIN D (ERGOCALCIFEROL) 1.25 MG (50000 UNIT) PO CAPS: 50000.0000 [IU] | ORAL_CAPSULE | ORAL | 0 refills | Status: DC

## 2016-10-24 NOTE — Progress Notes (Signed)
Chief Complaint  Patient presents with  . Follow-up    1 month   Here follow up Compliant with the metformin 500 BID Is trying to walk daily Has gained 8 lbs Decided not to go for bariatric eval Sugars dropped from 400s to 200s No side effects BP is good Labs discussed A1c over 8 Hyperlipidemia Vit d deficiency   Patient Active Problem List   Diagnosis Date Noted  . Vitamin D deficiency 10/24/2016  . Hyperlipidemia 10/24/2016  . Diabetes mellitus type 2 in obese (HCC) 09/22/2016  . Morbid obesity (HCC) 09/22/2016  . DUB (dysfunctional uterine bleeding) 09/22/2016  . Tobacco abuse 09/22/2016  . Allergic rhinitis 03/16/2014  . Seasonal allergies 04/16/2013    Outpatient Encounter Prescriptions as of 10/24/2016  Medication Sig  . ACCU-CHEK AVIVA PLUS test strip USE TO CHECK FASTING BLOOD SUGAR ONCE QAM  . ACCU-CHEK SOFTCLIX LANCETS lancets USE AS DIRECTED TO CHECK FASTING BLOOD SUGAR ONCE DAILY IN THE MORNING  . loratadine (CLARITIN) 10 MG tablet Take 1 tablet (10 mg total) by mouth daily.  . megestrol (MEGACE) 40 MG tablet 3 tablets a day for 5 days, 2 tablets a day for 5 days then 1 tablet daily (Patient taking differently: Take 40-120 mg by mouth See admin instructions. 3 tablets a day for 5 days, 2 tablets a day for 5 days then 1 tablet daily)  . mometasone (NASONEX) 50 MCG/ACT nasal spray Place 2 sprays into the nose daily.  . metFORMIN (GLUCOPHAGE) 1000 MG tablet Take 1 tablet (1,000 mg total) by mouth 2 (two) times daily with a meal.  . Vitamin D, Ergocalciferol, (DRISDOL) 50000 units CAPS capsule Take 1 capsule (50,000 Units total) by mouth every 7 (seven) days.   No facility-administered encounter medications on file as of 10/24/2016.     Allergies  Allergen Reactions  . Solu-Medrol [Methylprednisolone Acetate]     Rash, lip swelling     Review of Systems  Constitutional: Positive for unexpected weight change. Negative for activity change and appetite  change.  HENT: Positive for congestion and rhinorrhea.        Current cold  Eyes: Negative for visual disturbance.  Respiratory: Negative for cough and shortness of breath.   Cardiovascular: Negative for chest pain, palpitations and leg swelling.  Gastrointestinal: Negative for constipation and diarrhea.  Genitourinary: Negative for difficulty urinating and frequency.  Musculoskeletal: Negative.   Skin: Negative.   Neurological: Negative for dizziness and headaches.  Psychiatric/Behavioral: Negative for dysphoric mood and sleep disturbance.   BP 136/80 (BP Location: Right Arm, Patient Position: Sitting, Cuff Size: Large)   Pulse 100   Temp 97.6 F (36.4 Barr) (Oral)   Resp 18   Ht 5' 3.75" (1.619 m)   Wt (!) 342 lb 1.3 oz (155.2 kg)   SpO2 100%   BMI 59.18 kg/m   Physical Exam  Constitutional: She is oriented to person, place, and time. She appears well-developed and well-nourished. No distress.  HENT:  Head: Normocephalic and atraumatic.  Mouth/Throat: Oropharynx is clear and moist.  Eyes: Conjunctivae are normal. Pupils are equal, round, and reactive to light.  Neck: Normal range of motion. Neck supple.  Cardiovascular: Normal rate, regular rhythm and normal heart sounds.   Pulmonary/Chest: Effort normal and breath sounds normal.  Neurological: She is alert and oriented to person, place, and time.  Psychiatric: She has a normal mood and affect. Her behavior is normal.  Results for Selena Barr, Selena Barr (MRN [id number]) as of 10/24/2016 12:56  Ref. Range 10/23/2016 13:04  Sodium Latest Ref Range: 135 - 146 mmol/L 142  Potassium Latest Ref Range: 3.5 - 5.3 mmol/L 4.3  Chloride Latest Ref Range: 98 - 110 mmol/L 106  CO2 Latest Ref Range: 20 - 31 mmol/L 29  Mean Plasma Glucose Latest Units: mg/dL 960192  BUN Latest Ref Range: 7 - 25 mg/dL 9  Creatinine Latest Ref Range: 0.50 - 1.10 mg/dL 4.540.67  Calcium Latest Ref Range: 8.6 - 10.2 mg/dL 9.1  Glucose Latest Ref Range: 65 - 99 mg/dL 098137  (H)  Alkaline Phosphatase Latest Ref Range: 33 - 115 U/L 80  Albumin Latest Ref Range: 3.6 - 5.1 g/dL 3.6  AST Latest Ref Range: 10 - 30 U/L 16  ALT Latest Ref Range: 6 - 29 U/L 17  Total Protein Latest Ref Range: 6.1 - 8.1 g/dL 6.9  Total Bilirubin Latest Ref Range: 0.2 - 1.2 mg/dL 0.5  Cholesterol Latest Ref Range: <200 mg/dL 119173  Triglycerides Latest Ref Range: <150 mg/dL 147113  HDL Cholesterol Latest Ref Range: >50 mg/dL 27 (L)  LDL (calc) Latest Ref Range: <100 mg/dL 829123 (H)  VLDL Latest Ref Range: <30 mg/dL 23  Total CHOL/HDL Ratio Latest Ref Range: <5.0 Ratio 6.4 (H)  Vitamin D, 25-Hydroxy Latest Ref Range: 30 - 100 ng/mL 14 (L)  WBC Latest Ref Range: 3.8 - 10.8 K/uL 10.4  RBC Latest Ref Range: 3.80 - 5.10 MIL/uL 4.92  Hemoglobin Latest Ref Range: 11.7 - 15.5 g/dL 56.213.9  HCT Latest Ref Range: 35.0 - 45.0 % 43.1  MCV Latest Ref Range: 80.0 - 100.0 fL 87.6  MCH Latest Ref Range: 27.0 - 33.0 pg 28.3  MCHC Latest Ref Range: 32.0 - 36.0 g/dL 13.032.3  RDW Latest Ref Range: 11.0 - 15.0 % 13.6  Platelets Latest Ref Range: 140 - 400 K/uL 450 (H)  MPV Latest Ref Range: 7.5 - 12.5 fL 9.5  Hemoglobin A1C Latest Ref Range: <5.7 % 8.3 (H)  TSH Latest Units: mIU/L 0.63  Microalb, Ur Latest Ref Range: Not estab mg/dL 0.7    ASSESSMENT/PLAN:  1. Vitamin D deficiency   2. Hyperlipidemia, unspecified hyperlipidemia type  - Lipid panel  3. Diabetes mellitus type 2 in obese (HCC)  - Hemoglobin A1c 4. Morbid obesity.  Spent some time discussing the weight gain.  Diet.  Portions.  Exercise.  Benefits of bariatric surgery.  Patient Instructions  Continue to walk daily and keep track of your blood sugar  Increase the metformin to 1000 mg twice a day You can take 2 of the 500 mg tabs until they run out The new prescription will be for 1000 mg tabs  Take the vitamin D once a week for 12 weeks After this take vitamin D over the counter 2000 U a day  Labs and visit in 3 months  Call sooner  for problems   Eustace MooreYvonne Sue Mckinnley Cottier, MD

## 2016-10-24 NOTE — Patient Instructions (Signed)
Continue to walk daily and keep track of your blood sugar  Increase the metformin to 1000 mg twice a day You can take 2 of the 500 mg tabs until they run out The new prescription will be for 1000 mg tabs  Take the vitamin D once a week for 12 weeks After this take vitamin D over the counter 2000 U a day  Labs and visit in 3 months  Call sooner for problems

## 2016-11-06 ENCOUNTER — Encounter: Payer: Self-pay | Admitting: Nutrition

## 2016-11-06 ENCOUNTER — Encounter: Payer: Medicaid Other | Attending: Family Medicine | Admitting: Nutrition

## 2016-11-06 DIAGNOSIS — E669 Obesity, unspecified: Secondary | ICD-10-CM | POA: Insufficient documentation

## 2016-11-06 DIAGNOSIS — E1165 Type 2 diabetes mellitus with hyperglycemia: Secondary | ICD-10-CM

## 2016-11-06 DIAGNOSIS — Z713 Dietary counseling and surveillance: Secondary | ICD-10-CM | POA: Diagnosis present

## 2016-11-06 DIAGNOSIS — Z6841 Body Mass Index (BMI) 40.0 and over, adult: Secondary | ICD-10-CM | POA: Insufficient documentation

## 2016-11-06 DIAGNOSIS — E1169 Type 2 diabetes mellitus with other specified complication: Secondary | ICD-10-CM | POA: Diagnosis not present

## 2016-11-06 DIAGNOSIS — IMO0002 Reserved for concepts with insufficient information to code with codable children: Secondary | ICD-10-CM

## 2016-11-06 DIAGNOSIS — E118 Type 2 diabetes mellitus with unspecified complications: Secondary | ICD-10-CM

## 2016-11-06 NOTE — Progress Notes (Signed)
Medical Nutrition Therapy:  Appt start time: 1400 end time:  1500.  Assessment:  Primary concerns today:  DIabetes Type 2. Lives with her mom.  Has a 857 yr old daughter. Her mom does the cooking and she does the shopping.  Most foods are fried.  Eats 3 meals and snacks. HIghest weight is 340 lbs. Eats mostly fast foods. Desires goal weight is less than 200 lbs.  She weighted that 7 yrs ago..  Diet is high in sodium and processed foods. Just started back testing blood sugars. Has accucheck  Meter.30 day 201 mg/dl 14 day 161198 mg/dl avg And 7 day avg 096248 mg/dl.  Only tests BS in am. Drinks mostly soda, eats a lot of snacks and sweets and diet is low in fresh fruits and low carb vegetables. Taking Metformin 1000 mg a day but suppose to be on 2000 mg per day. Lab Results  Component Value Date   HGBA1C 8.3 (H) 10/23/2016   Wt Readings from Last 3 Encounters:  11/06/16 (!) 340 lb (154.2 kg)  10/24/16 (!) 342 lb 1.3 oz (155.2 kg)  09/22/16 (!) 333 lb (151 kg)   Ht Readings from Last 3 Encounters:  11/06/16 5\' 3"  (1.6 m)  10/24/16 5' 3.75" (1.619 m)  09/22/16 5' 3.75" (1.619 m)   Body mass index is 60.23 kg/m.  Preferred Learning Style:  No preference indicated   Learning Readiness:  Ready  Change in progress   MEDICATIONS: see list   DIETARY INTAKE:   24-hr recall:  B ( AM):  [employer]; 3 tacos and 4 cinnamon delite rolls and sweet te  Snk ( AM): potato chips,   L ( PM): Spaghetti and garlic bread 2 and Mt Dew Snk ( PM): chicken breast, watermelon drink D ( PM): Henry ScheinBurger king- 2 double cheeseburger and fry and Coke Snk ( PM):  Beverages: soda, tea, juice  Usual physical activity: ADL.  Estimated energy needs: 1500  calories 170  g carbohydrates 112 g protein 42 g fat  Progress Towards Goal(s):  In progress.   Nutritional Diagnosis:  NB-1.1 Food and nutrition-related knowledge deficit As related to DIabetes.  As evidenced by A1C 8.3%.    Intervention:  Nutrition and  Diabetes education provided on My Plate, CHO counting, meal planning, portion sizes, timing of meals, avoiding snacks between meals unless having a low blood sugar, target ranges for A1C and blood sugars, signs/symptoms and treatment of hyper/hypoglycemia, monitoring blood sugars, taking medications as prescribed, benefits of exercising 30 minutes per day and prevention of complications of DM. Low Salt Low Fat High Fiber Diet.  Goals 1. Follow My Plate 2. Eat 3 carb choices per meal 3. Drink only water-5+ bottles per day 4. Walk 30 minutes per day 5. Cut out snacks between meals 6. Increase fresh fruits and low carb vegetables. 7. Get A1C to 7%. Take 1000 mg of Metformin in am and 1000 mg at night per MD orders Make sure to take Metformin after breakfast and not on empty stomach for less problems with nausea or diarrhea. Record BS readings on log sheets and bring at all visits with meter.  Teaching Method Utilized:  Visual Auditory Hands on  Handouts given during visit include:  The Plate Method  Meal Plan Card  Diabetes Instructions.   Barriers to learning/adherence to lifestyle change: none  Demonstrated degree of understanding via:  Teach Back   Monitoring/Evaluation:  Dietary intake, exercise, meal planning, SBG, and body weight in 1 month(s). Recommend to  consider adding Invokana or Jardiance for needed weight loss and better BS control.

## 2016-11-06 NOTE — Patient Instructions (Signed)
Goals 1. Follow My Plate 2. Eat 3 carb choices per meal 3. Drink only water-5+ bottles per day 4. Walk 30 minutes per day 5. Cut out snacks between meals 6. Increase fresh fruits and low carb vegetables. 7. Get A1C to 7%. Take 1000 mg of Metformin in am and 1000 mg at night per MD orders Make sure to take Metformin after breakfast and not on empty stomach for less problems with nausea or diarrhea. Record BS readings on log sheets and bring at all visits with meter.

## 2016-11-23 ENCOUNTER — Emergency Department (HOSPITAL_COMMUNITY)
Admission: EM | Admit: 2016-11-23 | Discharge: 2016-11-23 | Disposition: A | Discharge status: Home / Self Care | Payer: Medicaid Other | Attending: Emergency Medicine | Admitting: Emergency Medicine

## 2016-11-23 ENCOUNTER — Encounter (HOSPITAL_COMMUNITY): Payer: Self-pay | Admitting: Emergency Medicine

## 2016-11-23 ENCOUNTER — Emergency Department (HOSPITAL_COMMUNITY): Payer: Medicaid Other

## 2016-11-23 DIAGNOSIS — Z79899 Other long term (current) drug therapy: Secondary | ICD-10-CM | POA: Insufficient documentation

## 2016-11-23 DIAGNOSIS — J029 Acute pharyngitis, unspecified: Secondary | ICD-10-CM | POA: Diagnosis present

## 2016-11-23 DIAGNOSIS — E119 Type 2 diabetes mellitus without complications: Secondary | ICD-10-CM | POA: Diagnosis not present

## 2016-11-23 DIAGNOSIS — Z20818 Contact with and (suspected) exposure to other bacterial communicable diseases: Secondary | ICD-10-CM

## 2016-11-23 DIAGNOSIS — J020 Streptococcal pharyngitis: Secondary | ICD-10-CM | POA: Diagnosis not present

## 2016-11-23 DIAGNOSIS — Z7984 Long term (current) use of oral hypoglycemic drugs: Secondary | ICD-10-CM | POA: Insufficient documentation

## 2016-11-23 DIAGNOSIS — F17210 Nicotine dependence, cigarettes, uncomplicated: Secondary | ICD-10-CM | POA: Diagnosis not present

## 2016-11-23 MED ORDER — AMOXICILLIN 500 MG PO CAPS: 500.0000 mg | ORAL_CAPSULE | Freq: Three times a day (TID) | ORAL | 0 refills | Status: AC

## 2016-11-23 MED ORDER — PROMETHAZINE-CODEINE 6.25-10 MG/5ML PO SYRP: 5.0000 mL | ORAL_SOLUTION | ORAL | 0 refills | Status: DC | PRN

## 2016-11-23 MED ORDER — AMOXICILLIN 500 MG PO CAPS: 500.0000 mg | ORAL_CAPSULE | Freq: Three times a day (TID) | ORAL | 0 refills | Status: DC

## 2016-11-23 NOTE — ED Triage Notes (Signed)
Pt c/o h/a x 7 days, sore throat x 4 days. Nad. Denies emesis, dizziness.

## 2016-11-23 NOTE — Discharge Instructions (Signed)
You may take the phenergan with codeine for help with your sore throat.  This will also help to suppress your cough prescribed for pain relief.  This will make you drowsy - do not drive within 4 hours of taking this medication.

## 2016-11-23 NOTE — ED Notes (Signed)
Pt taken to xray 

## 2016-11-23 NOTE — ED Notes (Signed)
Pt returned from xray. Nad.  

## 2016-11-23 NOTE — ED Notes (Signed)
Pt has visible white spots to back of throat with swelling. Pt staets gets h/a sometimes but never takes anything for it

## 2016-11-27 NOTE — ED Provider Notes (Signed)
[facility]-EMERGENCY DEPT Provider Note   CSN: [id number] Arrival date & time: 11/23/16  1054     History   Chief Complaint Chief Complaint  Patient presents with  . Headache  . Sore Throat    HPI Selena Barr is a 25 y.o. female.  The history is provided by the patient.  Sore Throat  This is a new problem. The current episode started more than 2 days ago (Her sister is currently being treated for strep throat.). The problem occurs constantly. The problem has been gradually worsening. Associated symptoms include headaches. Pertinent negatives include no chest pain, no abdominal pain and no shortness of breath. The symptoms are aggravated by swallowing. Nothing relieves the symptoms. She has tried acetaminophen for the symptoms. The treatment provided mild relief.    Past Medical History:  Diagnosis Date  . Allergy   . Diabetes mellitus without complication (HCC)   . Hyperlipidemia 10/24/2016  . Tobacco abuse 09/22/2016    Patient Active Problem List   Diagnosis Date Noted  . Vitamin D deficiency 10/24/2016  . Hyperlipidemia 10/24/2016  . Diabetes mellitus type 2 in obese (HCC) 09/22/2016  . Morbid obesity (HCC) 09/22/2016  . DUB (dysfunctional uterine bleeding) 09/22/2016  . Tobacco abuse 09/22/2016  . Allergic rhinitis 03/16/2014  . Seasonal allergies 04/16/2013    History reviewed. No pertinent surgical history.  OB History    Gravida Para Term Preterm AB Living   1 1 1     1    SAB TAB Ectopic Multiple Live Births                   Home Medications    Prior to Admission medications   Medication Sig Start Date End Date Taking? Authorizing Provider  ACCU-CHEK AVIVA PLUS test strip USE TO CHECK FASTING BLOOD SUGAR ONCE QAM 08/22/16   Historical Provider, MD  ACCU-CHEK SOFTCLIX LANCETS lancets USE AS DIRECTED TO CHECK FASTING BLOOD SUGAR ONCE DAILY IN THE MORNING 08/22/16   Historical Provider, MD  amoxicillin (AMOXIL) 500 MG capsule Take 1 capsule (500 mg  total) by mouth 3 (three) times daily. 11/23/16 12/03/16  Burgess AmorJulie Sylvester Minton, PA-C  loratadine (CLARITIN) 10 MG tablet Take 1 tablet (10 mg total) by mouth daily. Patient not taking: Reported on 11/06/2016 04/05/16   Loren Raceravid Yelverton, MD  megestrol (MEGACE) 40 MG tablet 3 tablets a day for 5 days, 2 tablets a day for 5 days then 1 tablet daily Patient taking differently: Take 40-120 mg by mouth See admin instructions. 3 tablets a day for 5 days, 2 tablets a day for 5 days then 1 tablet daily 01/04/16   Lazaro ArmsLuther H Eure, MD  metFORMIN (GLUCOPHAGE) 1000 MG tablet Take 1 tablet (1,000 mg total) by mouth 2 (two) times daily with a meal. 10/24/16   Eustace MooreYvonne Sue Nelson, MD  mometasone (NASONEX) 50 MCG/ACT nasal spray Place 2 sprays into the nose daily. Patient not taking: Reported on 11/06/2016 04/05/16   Loren Raceravid Yelverton, MD  promethazine-codeine [facility](PHENERGAN WITH CODEINE) 6.25-10 MG/5ML syrup Take 5 mLs by mouth every 4 (four) hours as needed for cough. 11/23/16   Burgess AmorJulie Favour Aleshire, PA-C  Vitamin D, Ergocalciferol, (DRISDOL) 50000 units CAPS capsule Take 1 capsule (50,000 Units total) by mouth every 7 (seven) days. 10/24/16   Eustace MooreYvonne Sue Nelson, MD    Family History Family History  Problem Relation Age of Onset  . Diabetes Mother   . Alcohol abuse Mother   . COPD Mother   . Drug abuse Mother   .  Cancer Maternal Grandmother     throat  . Cancer Maternal Grandfather     throat  . Diabetes Maternal Grandfather     Social History Social History  Substance Use Topics  . Smoking status: Current Every Day Smoker    Packs/day: 0.50    Years: 2.00    Types: Cigarettes  . Smokeless tobacco: Never Used  . Alcohol use No     Allergies   Solu-medrol [methylprednisolone acetate]   Review of Systems Review of Systems  Constitutional: Positive for chills, fatigue and fever.  HENT: Positive for sore throat. Negative for congestion, ear pain, rhinorrhea, sinus pressure, trouble swallowing and voice change.   Eyes: Negative  for discharge.  Respiratory: Negative for cough, shortness of breath, wheezing and stridor.   Cardiovascular: Negative for chest pain.  Gastrointestinal: Negative for abdominal pain, nausea and vomiting.  Genitourinary: Negative.   Skin: Negative for rash.  Neurological: Positive for headaches. Negative for weakness and numbness.     Physical Exam Updated Vital Signs BP 134/68 (BP Location: Right Arm)   Pulse 106   Temp 98.9 F (37.2 C) (Oral)   Resp 20   Ht 5\' 3"  (1.6 m)   Wt (!) 154.2 kg   SpO2 100%   BMI 60.23 kg/m   Physical Exam  Constitutional: She is oriented to person, place, and time. She appears well-developed and well-nourished.  HENT:  Head: Normocephalic and atraumatic.  Right Ear: Tympanic membrane and ear canal normal.  Left Ear: Tympanic membrane and ear canal normal.  Nose: No mucosal edema or rhinorrhea.  Mouth/Throat: Uvula is midline and mucous membranes are normal. Oropharyngeal exudate and posterior oropharyngeal erythema present. No posterior oropharyngeal edema or tonsillar abscesses.  Eyes: Conjunctivae are normal.  Cardiovascular: Normal rate and normal heart sounds.   Pulmonary/Chest: Effort normal. No respiratory distress. She has no wheezes. She has no rales.  Abdominal: Soft. There is no tenderness.  Musculoskeletal: Normal range of motion.  Lymphadenopathy:       Head (right side): Tonsillar adenopathy present.       Head (left side): Tonsillar adenopathy present.  Neurological: She is alert and oriented to person, place, and time. She has normal strength. No cranial nerve deficit or sensory deficit. Gait normal.  Skin: Skin is warm and dry. No rash noted.  Psychiatric: She has a normal mood and affect.     ED Treatments / Results  Labs (all labs ordered are listed, but only abnormal results are displayed) Labs Reviewed - No data to display  EKG  EKG Interpretation None       Radiology No results found.  Procedures Procedures  (including critical care time)  Medications Ordered in ED Medications - No data to display   Initial Impression / Assessment and Plan / ED Course  I have reviewed the triage vital signs and the nursing notes.  Pertinent labs & imaging results that were available during my care of the patient were reviewed by me and considered in my medical decision making (see chart for details).  Clinical Course     Rest, increased fluid intake, amoxil.  F/u with pcp prn if sx persist or worsen.  Final Clinical Impressions(s) / ED Diagnoses   Final diagnoses:  Pharyngitis, unspecified etiology  Strep throat exposure  Strep pharyngitis    New Prescriptions Discharge Medication List as of 11/23/2016  2:13 PM    START taking these medications   Details  amoxicillin (AMOXIL) 500 MG capsule Take 1 capsule (  500 mg total) by mouth 3 (three) times daily., Starting Thu 11/23/2016, Until Sun 12/03/2016, Print    promethazine-codeine (PHENERGAN WITH CODEINE) 6.25-10 MG/5ML syrup Take 5 mLs by mouth every 4 (four) hours as needed for cough., Starting Thu 11/23/2016, Print         Burgess AmorJulie Mirha Brucato, PA-C 11/27/16 1956    Bethann BerkshireJoseph Zammit, MD 12/05/16 1253

## 2016-12-13 ENCOUNTER — Ambulatory Visit: Payer: Medicaid Other | Admitting: Nutrition

## 2016-12-14 ENCOUNTER — Encounter: Payer: Medicaid Other | Attending: Family Medicine | Admitting: Nutrition

## 2016-12-14 DIAGNOSIS — Z6841 Body Mass Index (BMI) 40.0 and over, adult: Secondary | ICD-10-CM | POA: Insufficient documentation

## 2016-12-14 DIAGNOSIS — E1165 Type 2 diabetes mellitus with hyperglycemia: Secondary | ICD-10-CM

## 2016-12-14 DIAGNOSIS — Z713 Dietary counseling and surveillance: Secondary | ICD-10-CM | POA: Diagnosis not present

## 2016-12-14 DIAGNOSIS — E1169 Type 2 diabetes mellitus with other specified complication: Secondary | ICD-10-CM | POA: Diagnosis not present

## 2016-12-14 DIAGNOSIS — IMO0002 Reserved for concepts with insufficient information to code with codable children: Secondary | ICD-10-CM

## 2016-12-14 DIAGNOSIS — E118 Type 2 diabetes mellitus with unspecified complications: Secondary | ICD-10-CM

## 2016-12-14 DIAGNOSIS — E669 Obesity, unspecified: Secondary | ICD-10-CM | POA: Diagnosis not present

## 2016-12-14 NOTE — Patient Instructions (Addendum)
Goals 1. Change to regular cheririos instead of honey nut 2. Eat 1/2 banana with breakfast instead of whole one. 3.Eat carrots instead of chips 4. Walk to your sisters house daily. 5. Keep drinking water only. Cut out sodas and chips Lose 1-2 lbs per week

## 2016-12-14 NOTE — Progress Notes (Signed)
Medical Nutrition Therapy:  Appt start time: 1400 end time:  1500. Assessment:  Primary concerns today:  DIabetes Type 2. Lives with her mom.   Lost 7 lbs. Cut out sodas and fast foods. Cooking more foods at home.  Eating more fresh fruits and more baked and broiled foods. Taking Metformin 1000 mg BID. BS are better. Tolerating well.  Drinking more water- 7 bottles per day. Exercise: walk to her sisters house daily about 2-3 blocks for exercise. Making slow progress. Diet still higher in processed foods, higher sodium and sugar but improving slowly. BS logs brought in. But forgot her meter. FBS range 116-164 and eveing BS 148-262  Mg/dl. . Lab Results  Component Value Date   HGBA1C 8.3 (H) 10/23/2016   Wt Readings from Last 3 Encounters:  12/14/16 (!) 333 lb 12.8 oz (151.4 kg)  11/23/16 (!) 340 lb (154.2 kg)  11/06/16 (!) 340 lb (154.2 kg)   Ht Readings from Last 3 Encounters:  12/14/16 5\' 3"  (1.6 m)  11/23/16 5\' 3"  (1.6 m)  11/06/16 5\' 3"  (1.6 m)   CMP Latest Ref Rng & Units 10/23/2016 04/05/2016 04/04/2016  Glucose 65 - 99 mg/dL 161(W137(H) 960(A258(H) 540(J225(H)  BUN 7 - 25 mg/dL 9 11 14   Creatinine 0.50 - 1.10 mg/dL 8.110.67 9.140.76 7.820.85  Sodium 135 - 146 mmol/L 142 137 138  Potassium 3.5 - 5.3 mmol/L 4.3 3.9 3.3(L)  Chloride 98 - 110 mmol/L 106 105 103  CO2 20 - 31 mmol/L 29 24 26   Calcium 8.6 - 10.2 mg/dL 9.1 9.5(A8.5(L) 9.2  Total Protein 6.1 - 8.1 g/dL 6.9 6.8 7.6  Total Bilirubin 0.2 - 1.2 mg/dL 0.5 0.3 0.4  Alkaline Phos 33 - 115 U/L 80 87 97  AST 10 - 30 U/L 16 23 20   ALT 6 - 29 U/L 17 21 22    Lipid Panel     Component Value Date/Time   CHOL 173 10/23/2016 1304   TRIG 113 10/23/2016 1304   HDL 27 (L) 10/23/2016 1304   CHOLHDL 6.4 (H) 10/23/2016 1304   VLDL 23 10/23/2016 1304   LDLCALC 123 (H) 10/23/2016 1304    Body mass index is 59.13 kg/m.  Preferred Learning Style:  No preference indicated   Learning Readiness:  Ready  Change in progress   MEDICATIONS: see list    DIETARY INTAKE:   24-hr recall:  B ( AM):  Honey nut cheerios or cereal bars-nutritgrain Snk ( AM):   L ( PM): chicken, corn,  Snk ( PM): chicken breast,carrots, water D ( PM): [place] sausages, soda, Snk ( PM):  Beverages: water  Usual physical activity: ADL.  Estimated energy needs: 1500  calories 170  g carbohydrates 112 g protein 42 g fat  Progress Towards Goal(s):  In progress.   Nutritional Diagnosis:  NB-1.1 Food and nutrition-related knowledge deficit As related to DIabetes.  As evidenced by A1C 8.3%.    Intervention:  Nutrition and Diabetes education provided on My Plate, CHO counting, meal planning, portion sizes, timing of meals, avoiding snacks between meals unless having a low blood sugar, target ranges for A1C and blood sugars, signs/symptoms and treatment of hyper/hypoglycemia, monitoring blood sugars, taking medications as prescribed, benefits of exercising 30 minutes per day and prevention of complications of DM. Low Salt Low Fat High Fiber Diet.  Goals 1. Follow My Plate 2. Eat 3 carb choices per meal 3. Drink only water-5+ bottles per day 4. Walk 30 minutes per day 5. Cut out snacks between meals  6. Increase fresh fruits and low carb vegetables. 7. Get A1C to 7%. Take 1000 mg of Metformin in am and 1000 mg at night per MD orders Make sure to take Metformin after breakfast and not on empty stomach for less problems with nausea or diarrhea. Record BS readings on log sheets and bring at all visits with meter.  Teaching Method Utilized:  Visual Auditory Hands on  Handouts given during visit include:  The Plate Method  Meal Plan Card  Diabetes Instructions.   Barriers to learning/adherence to lifestyle change: none  Demonstrated degree of understanding via:  Teach Back   Monitoring/Evaluation:  Dietary intake, exercise, meal planning, SBG, and body weight in 1 month(s). Recommend to consider adding Invokana or Jardiance for needed weight loss  and better BS control.

## 2017-01-14 ENCOUNTER — Encounter (HOSPITAL_COMMUNITY): Payer: Self-pay | Admitting: Emergency Medicine

## 2017-01-14 ENCOUNTER — Emergency Department (HOSPITAL_COMMUNITY)
Admission: EM | Admit: 2017-01-14 | Discharge: 2017-01-14 | Disposition: A | Discharge status: Home / Self Care | Payer: Medicaid Other | Attending: Emergency Medicine | Admitting: Emergency Medicine

## 2017-01-14 DIAGNOSIS — F17210 Nicotine dependence, cigarettes, uncomplicated: Secondary | ICD-10-CM | POA: Diagnosis not present

## 2017-01-14 DIAGNOSIS — L02411 Cutaneous abscess of right axilla: Secondary | ICD-10-CM | POA: Insufficient documentation

## 2017-01-14 DIAGNOSIS — Z7984 Long term (current) use of oral hypoglycemic drugs: Secondary | ICD-10-CM | POA: Diagnosis not present

## 2017-01-14 DIAGNOSIS — E119 Type 2 diabetes mellitus without complications: Secondary | ICD-10-CM | POA: Insufficient documentation

## 2017-01-14 MED ORDER — LIDOCAINE HCL (PF) 1 % IJ SOLN
INTRAMUSCULAR | Status: AC
  Administered 2017-01-14: 22:00:00
  Filled 2017-01-14: qty 5

## 2017-01-14 MED ORDER — POVIDONE-IODINE 10 % EX SOLN
CUTANEOUS | Status: AC
  Filled 2017-01-14: qty 118

## 2017-01-14 MED ORDER — CEPHALEXIN 500 MG PO CAPS: 500.0000 mg | ORAL_CAPSULE | Freq: Four times a day (QID) | ORAL | 0 refills | Status: DC

## 2017-01-14 NOTE — ED Notes (Signed)
Instructed pt to take all of antibiotics as prescribed. 

## 2017-01-14 NOTE — ED Triage Notes (Signed)
Pt here for abscess under R arm.

## 2017-01-14 NOTE — Discharge Instructions (Signed)
Medications: Keflex  Treatment: Take Keflex 4 times daily for your skin infection. Make sure to finish all this medication. Keep your dressing on until tomorrow morning. You can wash the area with warm soapy water, pat dry and apply clean dressing. Do this daily.  Follow-up: Please follow-up with your primary care provider or return to the emergency department in 2 days for wound recheck. Please return sooner if you develop any fevers, increasing pain, redness, swelling from the area. You may continue to see some drainage.

## 2017-01-15 NOTE — ED Provider Notes (Signed)
[facility] DEPT Provider Note   CSN: [id number] Arrival date & time: 01/14/17  1959     History   Chief Complaint Chief Complaint  Patient presents with  . Abscess    HPI Selena Mingleiffany C Barr is a 26 y.o. female with history of diabetes and recurrent right axillary abscesses who presents with abscess to right axilla that began 2 weeks ago. Patient states is becoming increasing in size and increasingly painful. No drainage at home. Patient has been using warm compresses at home. Patient denies any fevers, chest pain, shortness of breath, abdominal pain, nausea, vomiting, urinary symptoms. Patient states her glucose levels have been elevated over the past month. She states she has been taking her metformin as prescribed.  HPI  Past Medical History:  Diagnosis Date  . Allergy   . Diabetes mellitus without complication (HCC)   . Hyperlipidemia 10/24/2016  . Tobacco abuse 09/22/2016    Patient Active Problem List   Diagnosis Date Noted  . Vitamin D deficiency 10/24/2016  . Hyperlipidemia 10/24/2016  . Diabetes mellitus type 2 in obese (HCC) 09/22/2016  . Morbid obesity (HCC) 09/22/2016  . DUB (dysfunctional uterine bleeding) 09/22/2016  . Tobacco abuse 09/22/2016  . Allergic rhinitis 03/16/2014  . Seasonal allergies 04/16/2013    History reviewed. No pertinent surgical history.  OB History    Gravida Para Term Preterm AB Living   1 1 1     1    SAB TAB Ectopic Multiple Live Births                   Home Medications    Prior to Admission medications   Medication Sig Start Date End Date Taking? Authorizing Provider  ACCU-CHEK AVIVA PLUS test strip USE TO CHECK FASTING BLOOD SUGAR ONCE QAM 08/22/16  Yes Historical Provider, MD  ACCU-CHEK SOFTCLIX LANCETS lancets USE AS DIRECTED TO CHECK FASTING BLOOD SUGAR ONCE DAILY IN THE MORNING 08/22/16  Yes Historical Provider, MD  loratadine (CLARITIN) 10 MG tablet Take 1 tablet (10 mg total) by mouth daily. 04/05/16  Yes Loren Raceravid  Yelverton, MD  metFORMIN (GLUCOPHAGE) 1000 MG tablet Take 1 tablet (1,000 mg total) by mouth 2 (two) times daily with a meal. 10/24/16  Yes Eustace MooreYvonne Sue Nelson, MD  cephALEXin (KEFLEX) 500 MG capsule Take 1 capsule (500 mg total) by mouth 4 (four) times daily. 01/14/17   Emi HolesAlexandra M Yolande Skoda, PA-C  Vitamin D, Ergocalciferol, (DRISDOL) 50000 units CAPS capsule Take 1 capsule (50,000 Units total) by mouth every 7 (seven) days. Patient not taking: Reported on 01/14/2017 10/24/16   Eustace MooreYvonne Sue Nelson, MD    Family History Family History  Problem Relation Age of Onset  . Diabetes Mother   . Alcohol abuse Mother   . COPD Mother   . Drug abuse Mother   . Cancer Maternal Grandmother     throat  . Cancer Maternal Grandfather     throat  . Diabetes Maternal Grandfather     Social History Social History  Substance Use Topics  . Smoking status: Current Every Day Smoker    Packs/day: 0.50    Years: 2.00    Types: Cigarettes  . Smokeless tobacco: Never Used  . Alcohol use No     Allergies   Solu-medrol [methylprednisolone acetate]   Review of Systems Review of Systems  Constitutional: Negative for chills and fever.  HENT: Negative for facial swelling and sore throat.   Respiratory: Negative for shortness of breath.   Cardiovascular: Negative for chest pain.  Gastrointestinal: Negative for abdominal pain, nausea and vomiting.  Genitourinary: Negative for dysuria.  Musculoskeletal: Negative for back pain.  Skin: Positive for wound (abscess). Negative for rash.  Neurological: Negative for headaches.  Psychiatric/Behavioral: The patient is not nervous/anxious.      Physical Exam Updated Vital Signs BP 134/82 (BP Location: Left Arm)   Pulse 89   Temp 98.7 F (37.1 C) (Temporal)   Resp 20   Ht 5\' 4"  (1.626 m)   Wt (!) 148.3 kg   SpO2 100%   BMI 56.13 kg/m   Physical Exam  Constitutional: She appears well-developed and well-nourished. No distress.  HENT:  Head: Normocephalic and  atraumatic.  Mouth/Throat: Oropharynx is clear and moist. No oropharyngeal exudate.  Eyes: Conjunctivae are normal. Pupils are equal, round, and reactive to light. Right eye exhibits no discharge. Left eye exhibits no discharge. No scleral icterus.  Neck: Normal range of motion. Neck supple. No thyromegaly present.  Cardiovascular: Normal rate, regular rhythm, normal heart sounds and intact distal pulses.  Exam reveals no gallop and no friction rub.   No murmur heard. Pulmonary/Chest: Effort normal and breath sounds normal. No stridor. No respiratory distress. She has no wheezes. She has no rales.  Abdominal: Soft. Bowel sounds are normal. She exhibits no distension. There is no tenderness. There is no rebound and no guarding.  Musculoskeletal: She exhibits no edema.  Lymphadenopathy:    She has no cervical adenopathy.  Neurological: She is alert. Coordination normal.  Skin: Skin is warm and dry. No rash noted. She is not diaphoretic. No pallor.  5 cm in diameter area of induration with central area of fluctuance to right axilla; area began to drain purulent, yellow, foul-smelling fluid from small punctate prior to my exam  Psychiatric: She has a normal mood and affect.  Nursing note and vitals reviewed.    ED Treatments / Results  Labs (all labs ordered are listed, but only abnormal results are displayed) Labs Reviewed - No data to display  EKG  EKG Interpretation None       Radiology No results found.  Procedures Procedures (including critical care time)  INCISION AND DRAINAGE Performed by: Emi Holes Consent: Verbal consent obtained. Risks and benefits: risks, benefits and alternatives were discussed Type: abscess  Body area: R axilla  Anesthesia: local infiltration  Incision was made with a scalpel.  Local anesthetic: lidocaine 1% w/o epinephrine  Anesthetic total: 3 ml  Complexity: complex Blunt dissection to break up loculations, however patient could  not tolerate for long; irrigated with normal saline  Drainage: purulent  Drainage amount: moderate  Patient tolerance: Patient tolerated the procedure well with no immediate complications.    Medications Ordered in ED Medications  povidone-iodine (BETADINE) 10 % external solution (not administered)  lidocaine (PF) (XYLOCAINE) 1 % injection (  Given by Other 01/14/17 2228)     Initial Impression / Assessment and Plan / ED Course  I have reviewed the triage vital signs and the nursing notes.  Pertinent labs & imaging results that were available during my care of the patient were reviewed by me and considered in my medical decision making (see chart for details).     Patient with skin abscess. Incision and drainage performed in the ED today.  Abscess was not large enough to warrant packing or drain placement. Wound recheck in 2 days. Supportive care and return precautions discussed. Considering patient is diabetic, Pt sent home with keflex. Patient advised to see primary care provider this  week for further evaluation and treatment of elevated blood pressures. Strict return precautions given. Patient understands and agrees with plan. Patient vitals stable throughout ED course discharged in satisfactory condition.   Final Clinical Impressions(s) / ED Diagnoses   Final diagnoses:  Abscess of axilla, right    New Prescriptions Discharge Medication List as of 01/14/2017 11:01 PM    START taking these medications   Details  cephALEXin (KEFLEX) 500 MG capsule Take 1 capsule (500 mg total) by mouth 4 (four) times daily., Starting Sun 01/14/2017, Print         [address] PA-C 01/15/17 0013    Vanetta Mulders, MD 01/15/17 0454

## 2017-01-16 ENCOUNTER — Ambulatory Visit (INDEPENDENT_AMBULATORY_CARE_PROVIDER_SITE_OTHER): Payer: Medicaid Other | Admitting: Family Medicine

## 2017-01-16 ENCOUNTER — Encounter: Payer: Self-pay | Admitting: Family Medicine

## 2017-01-16 VITALS — BP 120/80 | HR 88 | Temp 97.7°F | Resp 18 | Ht 63.75 in | Wt 335.1 lb

## 2017-01-16 DIAGNOSIS — Z09 Encounter for follow-up examination after completed treatment for conditions other than malignant neoplasm: Secondary | ICD-10-CM

## 2017-01-16 NOTE — Patient Instructions (Signed)
Warm compresses to area Take antibiotic as instructed Keep covered until drainage stops  See me later this month Remember to get blood test before appointment

## 2017-01-16 NOTE — Progress Notes (Signed)
    Chief Complaint  Patient presents with  . Follow-up    [facility] ER   Abscess R axilla over weekend Went to ER for I&D She was given Rx for keflex but didn't fill Pain is imporved No fever or redness of skin No problem with blood sugar  Patient Active Problem List   Diagnosis Date Noted  . Vitamin D deficiency 10/24/2016  . Hyperlipidemia 10/24/2016  . Diabetes mellitus type 2 in obese (HCC) 09/22/2016  . Morbid obesity (HCC) 09/22/2016  . DUB (dysfunctional uterine bleeding) 09/22/2016  . Tobacco abuse 09/22/2016  . Allergic rhinitis 03/16/2014  . Seasonal allergies 04/16/2013    Outpatient Encounter Prescriptions as of 01/16/2017  Medication Sig  . ACCU-CHEK AVIVA PLUS test strip USE TO CHECK FASTING BLOOD SUGAR ONCE QAM  . ACCU-CHEK SOFTCLIX LANCETS lancets USE AS DIRECTED TO CHECK FASTING BLOOD SUGAR ONCE DAILY IN THE MORNING  . cephALEXin (KEFLEX) 500 MG capsule Take 1 capsule (500 mg total) by mouth 4 (four) times daily.  Marland Kitchen. loratadine (CLARITIN) 10 MG tablet Take 1 tablet (10 mg total) by mouth daily.  . metFORMIN (GLUCOPHAGE) 1000 MG tablet Take 1 tablet (1,000 mg total) by mouth 2 (two) times daily with a meal.  . Vitamin D, Ergocalciferol, (DRISDOL) 50000 units CAPS capsule Take 1 capsule (50,000 Units total) by mouth every 7 (seven) days.   No facility-administered encounter medications on file as of 01/16/2017.     Allergies  Allergen Reactions  . Solu-Medrol [Methylprednisolone Acetate]     Rash, lip swelling     Review of Systems  Constitutional: Negative for chills and fever.  HENT: Negative for congestion and dental problem.   Eyes: Negative for redness and visual disturbance.  Respiratory: Negative for cough and shortness of breath.   Cardiovascular: Negative for chest pain and palpitations.  Gastrointestinal: Negative for nausea and vomiting.  Skin: Positive for wound.  All other systems reviewed and are negative.    BP 120/80 (BP Location: Left  Arm, Patient Position: Sitting, Cuff Size: Normal)   Pulse 88   Temp 97.7 F (36.5 C) (Temporal)   Resp 18   Ht 5' 3.75" (1.619 m)   Wt (!) 335 lb 1.9 oz (152 kg)   SpO2 99%   BMI 57.98 kg/m   Physical Exam  Constitutional: She is oriented to person, place, and time. She appears well-developed and well-nourished. No distress.  Super obese  HENT:  Head: Normocephalic and atraumatic.  Mouth/Throat: Oropharynx is clear and moist.  Neurological: She is alert and oriented to person, place, and time.  Skin:  Right axilla with incision approx 1 cm across, draining serous and pustular scant material.  Underlying induration 4 cm across  Psychiatric: She has a normal mood and affect. Her behavior is normal.    ASSESSMENT/PLAN:  1. Encounter for recheck of abscess following incision and drainage improved   Patient Instructions  Warm compresses to area Take antibiotic as instructed Keep covered until drainage stops  See me later this month Remember to get blood test before appointment   Eustace MooreYvonne Sue Marven Veley, MD

## 2017-01-24 LAB — LIPID PANEL
CHOLESTEROL: 164 mg/dL (ref <200)
HDL: 28 mg/dL — ABNORMAL LOW (ref >50)
LDL CALC: 115 mg/dL — AB (ref <100)
TRIGLYCERIDES: 107 mg/dL (ref <150)
Total CHOL/HDL Ratio: 5.9 Ratio — ABNORMAL HIGH (ref <5.0)
VLDL: 21 mg/dL (ref <30)

## 2017-01-24 LAB — HEMOGLOBIN A1C
HEMOGLOBIN A1C: 8.3 % — AB (ref <5.7)
Mean Plasma Glucose: 192 mg/dL

## 2017-01-25 ENCOUNTER — Ambulatory Visit: Payer: Medicaid Other | Admitting: Family Medicine

## 2017-01-26 ENCOUNTER — Encounter: Payer: Self-pay | Admitting: Family Medicine

## 2017-01-26 ENCOUNTER — Ambulatory Visit: Payer: Medicaid Other | Admitting: Family Medicine

## 2017-01-26 DIAGNOSIS — E669 Obesity, unspecified: Principal | ICD-10-CM

## 2017-01-26 DIAGNOSIS — E1169 Type 2 diabetes mellitus with other specified complication: Secondary | ICD-10-CM

## 2017-01-31 ENCOUNTER — Ambulatory Visit: Payer: Medicaid Other | Admitting: Nutrition

## 2017-02-01 ENCOUNTER — Ambulatory Visit: Payer: Medicaid Other | Admitting: Nutrition

## 2017-02-01 ENCOUNTER — Telehealth: Payer: Self-pay | Admitting: Nutrition

## 2017-02-06 NOTE — Telephone Encounter (Signed)
error 

## 2017-02-07 ENCOUNTER — Other Ambulatory Visit: Payer: Self-pay | Admitting: Obstetrics & Gynecology

## 2017-02-07 ENCOUNTER — Encounter: Payer: Medicaid Other | Attending: Family Medicine | Admitting: Nutrition

## 2017-02-07 VITALS — Ht 63.0 in | Wt 334.0 lb

## 2017-02-07 DIAGNOSIS — E669 Obesity, unspecified: Secondary | ICD-10-CM | POA: Diagnosis not present

## 2017-02-07 DIAGNOSIS — E1169 Type 2 diabetes mellitus with other specified complication: Secondary | ICD-10-CM | POA: Diagnosis not present

## 2017-02-07 DIAGNOSIS — Z713 Dietary counseling and surveillance: Secondary | ICD-10-CM | POA: Insufficient documentation

## 2017-02-07 DIAGNOSIS — E118 Type 2 diabetes mellitus with unspecified complications: Secondary | ICD-10-CM

## 2017-02-07 DIAGNOSIS — E1165 Type 2 diabetes mellitus with hyperglycemia: Secondary | ICD-10-CM

## 2017-02-07 DIAGNOSIS — IMO0002 Reserved for concepts with insufficient information to code with codable children: Secondary | ICD-10-CM

## 2017-02-07 NOTE — Progress Notes (Signed)
Medical Nutrition Therapy:  Appt start time: 1400 end time:  1500. Assessment:  Primary concerns today:  DIabetes Type 2  Forgot meter. Changed: Eating more boiled and baked foods. Lots of water and not snacking. FBS: 100's  Bedtimes 150-170 mg/dl. A1C up to 8.3%.  Meds: Metformin 1000 mg per day is what she has been taking but it is ordered 1000 mg BID. Not exercising due to cold weather. Willing to start walking for weight loss. Scheduled to see Dr. Fransico HimNida, Endocrinologist 02-28-17,  Diet is insuffient in fresh fruits, vegetables and whole grains. Making slow progress. May benefit from  Needs better diet and medication compliance.    . Lab Results  Component Value Date   HGBA1C 8.3 (H) 01/24/2017   Wt Readings from Last 3 Encounters:  02/07/17 (!) 334 lb (151.5 kg)  01/16/17 (!) 335 lb 1.9 oz (152 kg)  01/14/17 (!) 327 lb (148.3 kg)   Ht Readings from Last 3 Encounters:  02/07/17 5\' 3"  (1.6 m)  01/16/17 5' 3.75" (1.619 m)  01/14/17 5\' 4"  (1.626 m)   CMP Latest Ref Rng & Units 10/23/2016 04/05/2016 04/04/2016  Glucose 65 - 99 mg/dL 960(A137(H) 540(J258(H) 811(B225(H)  BUN 7 - 25 mg/dL 9 11 14   Creatinine 0.50 - 1.10 mg/dL 1.470.67 8.290.76 5.620.85  Sodium 135 - 146 mmol/L 142 137 138  Potassium 3.5 - 5.3 mmol/L 4.3 3.9 3.3(L)  Chloride 98 - 110 mmol/L 106 105 103  CO2 20 - 31 mmol/L 29 24 26   Calcium 8.6 - 10.2 mg/dL 9.1 1.3(Y8.5(L) 9.2  Total Protein 6.1 - 8.1 g/dL 6.9 6.8 7.6  Total Bilirubin 0.2 - 1.2 mg/dL 0.5 0.3 0.4  Alkaline Phos 33 - 115 U/L 80 87 97  AST 10 - 30 U/L 16 23 20   ALT 6 - 29 U/L 17 21 22    Lipid Panel     Component Value Date/Time   CHOL 164 01/24/2017 1055   TRIG 107 01/24/2017 1055   HDL 28 (L) 01/24/2017 1055   CHOLHDL 5.9 (H) 01/24/2017 1055   VLDL 21 01/24/2017 1055   LDLCALC 115 (H) 01/24/2017 1055    Body mass index is 59.17 kg/m.  Preferred Learning Style:  No preference indicated   Learning Readiness:  Ready  Change in progress   MEDICATIONS: see list    DIETARY INTAKE:   24-hr recall:  B ( AM):  BOiled egg and banana, water Snk ( AM):   L ( PM): Baked chicken with corn, water,  D): BBQ ribs, greens, and baked beans, Water Snk ( PM):   Beverages: water  Usual physical activity: ADL.  Estimated energy needs: 1500  calories 170  g carbohydrates 112 g protein 42 g fat  Progress Towards Goal(s):  In progress.   Nutritional Diagnosis:  NB-1.1 Food and nutrition-related knowledge deficit As related to DIabetes.  As evidenced by A1C 8.3%.    Intervention:  Nutrition and Diabetes education provided on My Plate, CHO counting, meal planning, portion sizes, timing of meals, avoiding snacks between meals unless having a low blood sugar, target ranges for A1C and blood sugars, signs/symptoms and treatment of hyper/hypoglycemia, monitoring blood sugars, taking medications as prescribed, benefits of exercising 30 minutes per day and prevention of complications of DM. Low Salt Low Fat High Fiber Diet.  Goals 1. Take 1 pill of Metformin after breakfast and after supper daily. 2. Keep avoiding snacks  3. Exercise 30 minutes a day 4. Lose 1 lb per week 5. Keep drinking  only water and avoid sodas. Get A1C down to 7%.  Teaching Method Utilized:  Visual Auditory Hands on  Handouts given during visit include:  The Plate Method  Meal Plan Card  Diabetes Instructions.   Barriers to learning/adherence to lifestyle change: none  Demonstrated degree of understanding via:  Teach Back   Monitoring/Evaluation:  Dietary intake, exercise, meal planning, SBG, and body weight in 1 month(s). Recommend to consider adding Invokana or Jardiance for needed weight loss and better BS control.

## 2017-02-07 NOTE — Patient Instructions (Signed)
Goals 1. Take 1 pill of Metformin after breakfast and after supper daily. 2. Keep avoiding snacks  3. Exercise 30 minutes a day 4. Lose 1 lb per week 5. Keep drinking only water and avoid sodas. Get A1C down to 7%.

## 2017-02-08 ENCOUNTER — Encounter: Payer: Self-pay | Admitting: Family Medicine

## 2017-02-08 ENCOUNTER — Telehealth: Payer: Self-pay | Admitting: Obstetrics & Gynecology

## 2017-02-08 ENCOUNTER — Ambulatory Visit (INDEPENDENT_AMBULATORY_CARE_PROVIDER_SITE_OTHER): Payer: Medicaid Other | Admitting: Family Medicine

## 2017-02-08 VITALS — BP 148/80 | HR 88 | Temp 97.8°F | Resp 18 | Ht 63.75 in | Wt 336.1 lb

## 2017-02-08 DIAGNOSIS — E1169 Type 2 diabetes mellitus with other specified complication: Secondary | ICD-10-CM

## 2017-02-08 DIAGNOSIS — E669 Obesity, unspecified: Secondary | ICD-10-CM

## 2017-02-08 DIAGNOSIS — J029 Acute pharyngitis, unspecified: Secondary | ICD-10-CM

## 2017-02-08 LAB — POCT RAPID STREP A (OFFICE): RAPID STREP A SCREEN: NEGATIVE

## 2017-02-08 MED ORDER — SITAGLIPTIN PHOSPHATE 100 MG PO TABS: 100.0000 mg | ORAL_TABLET | Freq: Every day | ORAL | 3 refills | Status: DC

## 2017-02-08 NOTE — Telephone Encounter (Signed)
Pt called stating that she would like a refill of her  Megestrol Acetate 40 MG, PLease contact pt. Pharmacy has already sent us a fax.

## 2017-02-08 NOTE — Patient Instructions (Addendum)
The strep test is = NEGATIVE The sore throat is caused by a cold virus  Continue to watch your diet Walk every day that you are able  Add januvia once a day in the morning Check your blood sugar once a day in the morning Take a log to your diabetes visit March 28th  Need labs and follow up in 3 months

## 2017-02-08 NOTE — Progress Notes (Signed)
Chief Complaint  Patient presents with  . Follow-up    3 month   Here for diabetes follow up She has tried to make dietary changes She has lost from 342 to 336 lbs Her A1c is the SAME at 8.3 She is taking metformin 1000 mg twice a day I will add Januvia She will see endocrinology Mar 28th Lipids are a bit better  She has a cold and sore throat today for several days No fever No exposure to strep  Patient Active Problem List   Diagnosis Date Noted  . Vitamin D deficiency 10/24/2016  . Hyperlipidemia 10/24/2016  . Diabetes mellitus type 2 in obese (HCC) 09/22/2016  . Morbid obesity (HCC) 09/22/2016  . DUB (dysfunctional uterine bleeding) 09/22/2016  . Tobacco abuse 09/22/2016  . Allergic rhinitis 03/16/2014  . Seasonal allergies 04/16/2013    Outpatient Encounter Prescriptions as of 02/08/2017  Medication Sig  . ACCU-CHEK AVIVA PLUS test strip USE TO CHECK FASTING BLOOD SUGAR ONCE QAM  . ACCU-CHEK SOFTCLIX LANCETS lancets USE AS DIRECTED TO CHECK FASTING BLOOD SUGAR ONCE DAILY IN THE MORNING  . cholecalciferol (VITAMIN D) 1000 units tablet Take 2,000 Units by mouth daily.  Marland Kitchen. loratadine (CLARITIN) 10 MG tablet Take 1 tablet (10 mg total) by mouth daily.  . metFORMIN (GLUCOPHAGE) 1000 MG tablet Take 1 tablet (1,000 mg total) by mouth 2 (two) times daily with a meal.  . sitaGLIPtin (JANUVIA) 100 MG tablet Take 1 tablet (100 mg total) by mouth daily.   No facility-administered encounter medications on file as of 02/08/2017.     Allergies  Allergen Reactions  . Solu-Medrol [Methylprednisolone Acetate]     Rash, lip swelling     Review of Systems  Constitutional: Negative for activity change, appetite change, chills, fatigue and fever.  HENT: Positive for rhinorrhea, sinus pressure and sore throat.   Eyes: Negative for redness and visual disturbance.  Respiratory: Positive for cough. Negative for shortness of breath.   Cardiovascular: Negative for chest pain and  palpitations.  Gastrointestinal: Negative for diarrhea and vomiting.  Endocrine: Positive for polyuria.  Genitourinary: Positive for frequency.  Neurological: Negative for dizziness and headaches.  Psychiatric/Behavioral: Negative for sleep disturbance. The patient is not nervous/anxious.    Results for Marlane MingleZIGLAR, Malak C (MRN [id number]) as of 02/08/2017 15:10  Ref. Range 01/24/2017 10:55  Mean Plasma Glucose Latest Units: mg/dL 409192  Total CHOL/HDL Ratio Latest Ref Range: <5.0 Ratio 5.9 (H)  Cholesterol Latest Ref Range: <200 mg/dL 811164  HDL Cholesterol Latest Ref Range: >50 mg/dL 28 (L)  LDL (calc) Latest Ref Range: <100 mg/dL 914115 (H)  Triglycerides Latest Ref Range: <150 mg/dL 782107  VLDL Latest Ref Range: <30 mg/dL 21  Hemoglobin N5AA1C Latest Ref Range: <5.7 % 8.3 (H)    BP (!) 148/80 (BP Location: Right Arm, Patient Position: Sitting, Cuff Size: Large)   Pulse 88   Temp 97.8 F (36.6 C) (Temporal)   Resp 18   Ht 5' 3.75" (1.619 m)   Wt (!) 336 lb 1.3 oz (152.4 kg)   SpO2 92%   BMI 58.14 kg/m   Physical Exam  Constitutional: She is oriented to person, place, and time. She appears well-developed and well-nourished. No distress.  HENT:  Head: Normocephalic and atraumatic.  Right Ear: External ear normal.  Left Ear: External ear normal.  Nose: Nose normal.  Mouth/Throat: Oropharynx is clear and moist. No oropharyngeal exudate.  tonsils enlarged and red.  Strep Neg  Eyes: Conjunctivae are normal.  Pupils are equal, round, and reactive to light.  Neck: Normal range of motion.  Cardiovascular: Normal rate, regular rhythm and normal heart sounds.   Pulmonary/Chest: Effort normal and breath sounds normal.  Abdominal: Soft. Bowel sounds are normal. She exhibits no distension. There is no tenderness.  Lymphadenopathy:    She has no cervical adenopathy.  Neurological: She is oriented to person, place, and time.  Skin: Skin is warm and dry.  Psychiatric: She has a normal mood and affect.  Her behavior is normal.    ASSESSMENT/PLAN:  1. Diabetes mellitus type 2 in obese (HCC)   2. Sore throat  - POCT rapid strep A   Patient Instructions  The strep test is = NEGATIVE The sore throat is caused by a cold virus  Continue to watch your diet Walk every day that you are able  Add januvia once a day in the morning Check your blood sugar once a day in the morning Take a log to your diabetes visit March 28th  Need labs and follow up in 3 months   Eustace Moore, MD

## 2017-02-28 ENCOUNTER — Encounter: Payer: Self-pay | Admitting: "Endocrinology

## 2017-02-28 ENCOUNTER — Ambulatory Visit: Payer: Medicaid Other | Admitting: Nutrition

## 2017-02-28 ENCOUNTER — Ambulatory Visit (INDEPENDENT_AMBULATORY_CARE_PROVIDER_SITE_OTHER): Payer: Medicaid Other | Admitting: "Endocrinology

## 2017-02-28 VITALS — BP 140/83 | HR 110 | Ht 63.75 in | Wt 339.0 lb

## 2017-02-28 DIAGNOSIS — E782 Mixed hyperlipidemia: Secondary | ICD-10-CM

## 2017-02-28 DIAGNOSIS — E1169 Type 2 diabetes mellitus with other specified complication: Secondary | ICD-10-CM

## 2017-02-28 DIAGNOSIS — Z720 Tobacco use: Secondary | ICD-10-CM | POA: Diagnosis not present

## 2017-02-28 DIAGNOSIS — E669 Obesity, unspecified: Secondary | ICD-10-CM

## 2017-02-28 NOTE — Patient Instructions (Signed)

## 2017-02-28 NOTE — Progress Notes (Signed)
Subjective:    Patient ID: Selena Barr, female    DOB: Nov 17, 1991. Patient is being seen in consultation for management of diabetes requested by  Eustace Moore, MD  Past Medical History:  Diagnosis Date  . Allergy   . Diabetes mellitus without complication (HCC)   . Hyperlipidemia 10/24/2016  . Tobacco abuse 09/22/2016   No past surgical history on file. Social History   Social History  . Marital status: Single    Spouse name: N/A  . Number of children: 1  . Years of education: 60   Occupational History  .      unemployed   Social History Main Topics  . Smoking status: Current Every Day Smoker    Packs/day: 0.50    Years: 2.00    Types: Cigarettes  . Smokeless tobacco: Never Used  . Alcohol use No  . Drug use: No     Comment: denies use 08/28/14  . Sexual activity: Yes    Birth control/ protection: Implant   Other Topics Concern  . None   Social History Narrative   Lives with mother and brother, sister, daughter   Outpatient Encounter Prescriptions as of 02/28/2017  Medication Sig  . ACCU-CHEK AVIVA PLUS test strip USE TO CHECK FASTING BLOOD SUGAR ONCE QAM  . ACCU-CHEK SOFTCLIX LANCETS lancets USE AS DIRECTED TO CHECK FASTING BLOOD SUGAR ONCE DAILY IN THE MORNING  . cholecalciferol (VITAMIN D) 1000 units tablet Take 2,000 Units by mouth daily.  Marland Kitchen loratadine (CLARITIN) 10 MG tablet Take 1 tablet (10 mg total) by mouth daily.  . megestrol (MEGACE) 40 MG tablet TAKE 3 TABLETS BY MOUTH DAILY X 5 DAYS, TAKE 2 TABLETS DAILY X 5 DAYS, THEN TAKE 1 TABLET DAILY  . metFORMIN (GLUCOPHAGE) 1000 MG tablet Take 1 tablet (1,000 mg total) by mouth 2 (two) times daily with a meal.  . sitaGLIPtin (JANUVIA) 100 MG tablet Take 1 tablet (100 mg total) by mouth daily.   No facility-administered encounter medications on file as of 02/28/2017.    ALLERGIES: Allergies  Allergen Reactions  . Solu-Medrol [Methylprednisolone Acetate]     Rash, lip swelling    VACCINATION  STATUS: Immunization History  Administered Date(s) Administered  . HPV Quadrivalent 08/18/2008  . Hepatitis A 11/17/2008  . Hepatitis B 12/14/1997, 08/29/2004, 02/27/2005  . Influenza Whole 09/22/2016  . Influenza-Unspecified 12/06/2012  . Meningococcal Conjugate 08/18/2008  . Tdap 09/16/2015    Diabetes  She presents for her initial diabetic visit. She has type 2 diabetes mellitus. Onset time: She was diagnosed at approximate age of 24 years. Her disease course has been stable. There are no hypoglycemic associated symptoms. Pertinent negatives for hypoglycemia include no confusion, headaches, pallor or seizures. Associated symptoms include fatigue, polydipsia and polyuria. Pertinent negatives for diabetes include no chest pain and no polyphagia. There are no hypoglycemic complications. Symptoms are stable. There are no diabetic complications. Risk factors for coronary artery disease include diabetes mellitus, obesity, sedentary lifestyle, tobacco exposure, family history and dyslipidemia. Current diabetic treatment includes oral agent (dual therapy). She is compliant with treatment most of the time. She is following a generally unhealthy diet. When asked about meal planning, she reported none. She has not had a previous visit with a dietitian. She rarely participates in exercise. An ACE inhibitor/angiotensin II receptor blocker is not being taken. Eye exam is not current.       Review of Systems  Constitutional: Positive for fatigue. Negative for chills, fever and unexpected  weight change.  HENT: Negative for trouble swallowing and voice change.   Eyes: Negative for visual disturbance.  Respiratory: Negative for cough, shortness of breath and wheezing.   Cardiovascular: Negative for chest pain, palpitations and leg swelling.  Gastrointestinal: Negative for diarrhea, nausea and vomiting.  Endocrine: Positive for polydipsia and polyuria. Negative for cold intolerance, heat intolerance and  polyphagia.  Musculoskeletal: Negative for arthralgias and myalgias.  Skin: Negative for color change, pallor, rash and wound.  Neurological: Negative for seizures and headaches.  Psychiatric/Behavioral: Negative for confusion and suicidal ideas.    Objective:    BP 140/83   Pulse (!) 110   Ht 5' 3.75" (1.619 m)   Wt (!) 339 lb (153.8 kg)   BMI 58.65 kg/m   Wt Readings from Last 3 Encounters:  02/28/17 (!) 339 lb (153.8 kg)  02/08/17 (!) 336 lb 1.3 oz (152.4 kg)  02/07/17 (!) 334 lb (151.5 kg)    Physical Exam  Constitutional: She is oriented to person, place, and time. She appears well-developed.  HENT:  Head: Normocephalic and atraumatic.  Eyes: EOM are normal.  Neck: Normal range of motion. Neck supple. No tracheal deviation present. No thyromegaly present.  Cardiovascular: Normal rate and regular rhythm.   Pulmonary/Chest: Effort normal and breath sounds normal.  Abdominal: Soft. Bowel sounds are normal. There is no tenderness. There is no guarding.  Musculoskeletal: Normal range of motion. She exhibits no edema.  Neurological: She is alert and oriented to person, place, and time. She has normal reflexes. No cranial nerve deficit. Coordination normal.  Skin: Skin is warm and dry. No rash noted. No erythema. No pallor.  Psychiatric: She has a normal mood and affect. Judgment normal.    CMP     Component Value Date/Time   NA 142 10/23/2016 1304   K 4.3 10/23/2016 1304   CL 106 10/23/2016 1304   CO2 29 10/23/2016 1304   GLUCOSE 137 (H) 10/23/2016 1304   BUN 9 10/23/2016 1304   CREATININE 0.67 10/23/2016 1304   CALCIUM 9.1 10/23/2016 1304   PROT 6.9 10/23/2016 1304   ALBUMIN 3.6 10/23/2016 1304   AST 16 10/23/2016 1304   ALT 17 10/23/2016 1304   ALKPHOS 80 10/23/2016 1304   BILITOT 0.5 10/23/2016 1304   GFRNONAA >60 04/05/2016 2012   GFRAA >60 04/05/2016 2012    Diabetic Labs (most recent): Lab Results  Component Value Date   HGBA1C 8.3 (H) 01/24/2017    HGBA1C 8.3 (H) 10/23/2016     Lipid Panel ( most recent) Lipid Panel     Component Value Date/Time   CHOL 164 01/24/2017 1055   TRIG 107 01/24/2017 1055   HDL 28 (L) 01/24/2017 1055   CHOLHDL 5.9 (H) 01/24/2017 1055   VLDL 21 01/24/2017 1055   LDLCALC 115 (H) 01/24/2017 1055      Assessment & Plan:   1. Diabetes mellitus type 2 in obese [facility])  - Patient has currently uncontrolled symptomatic type 2 DM since  26 years of age,  with most recent A1c of 8.3 %. Recent labs reviewed.   Her diabetes is complicated by obesity/sedentary life, smoking and patient remains at a high risk for more acute and chronic complications of diabetes which include CAD, CVA, CKD, retinopathy, and neuropathy. These are all discussed in detail with the patient.  - I have counseled the patient on diet management and weight loss, by adopting a carbohydrate restricted/protein rich diet.  - Suggestion is made for patient to avoid  simple carbohydrates   from their diet including Cakes , Desserts, Ice Cream,  Soda (  diet and regular) , Sweet Tea , Candies,  Chips, Cookies, Artificial Sweeteners,   and "Sugar-free" Products . This will help patient to have stable blood glucose profile and potentially avoid unintended weight gain.  - I encouraged the patient to switch to  unprocessed or minimally processed complex starch and increased protein intake (animal or plant source), fruits, and vegetables.  - Patient is advised to stick to a routine mealtimes to eat 3 meals  a day and avoid unnecessary snacks ( to snack only to correct hypoglycemia).  - The patient will be scheduled with Norm SaltPenny Crumpton, RDN, CDE for individualized DM education.  - I have approached patient with the following individualized plan to manage diabetes and patient agrees:   - I urged her to engage for intensive modification of lifestyle so she can delay the need for insulin treatment.  - I will continue metformin 1000 g by mouth twice a day,  and Januvia 100 mg by mouth daily, therapeutically suitable for patient.  - Patient specific target  A1c;  LDL, HDL, Triglycerides, and  Waist Circumference were discussed in detail.  2) BP/HTN: Controlled. Patient is not on an ACE inhibitors.  3) Lipids/HPL:   Uncontrolled, LDL of 115.  She is not age-appropriate for statins.  4)  Weight/Diet: CDE Consult will be initiated , exercise, and detailed carbohydrates information provided.  5) Chronic Care/Health Maintenance:  -Patient is encouraged to continue to follow up with Ophthalmology, Podiatrist at least yearly or according to recommendations, and advised to  Quit smoking. I have recommended yearly flu vaccine and pneumonia vaccination at least every 5 years; moderate intensity exercise for up to 150 minutes weekly; and  sleep for at least 7 hours a day.  - 60 minutes of time was spent on the care of this patient , 50% of which was applied for counseling on diabetes complications and their preventions.  - I advised patient to maintain close follow up with Eustace MooreYvonne Sue Nelson, MD for primary care needs.  Follow up plan: - Return in about 3 months (around 05/31/2017) for follow up with pre-visit labs.  Selena LunchGebre Chalsea Darko, MD Phone: [phone]  Fax: [phone]   02/28/2017, 10:52 AM

## 2017-03-08 ENCOUNTER — Ambulatory Visit: Payer: Medicaid Other | Admitting: Nutrition

## 2017-03-08 ENCOUNTER — Telehealth: Payer: Self-pay | Admitting: Nutrition

## 2017-03-08 NOTE — Telephone Encounter (Signed)
Vm to call and reschedule appt.

## 2017-03-21 ENCOUNTER — Encounter: Payer: Self-pay | Admitting: Family Medicine

## 2017-05-16 ENCOUNTER — Ambulatory Visit: Payer: Medicaid Other | Admitting: Family Medicine

## 2017-06-14 ENCOUNTER — Ambulatory Visit: Payer: Medicaid Other | Admitting: "Endocrinology

## 2017-07-12 ENCOUNTER — Encounter: Payer: Self-pay | Admitting: Family Medicine

## 2017-07-12 ENCOUNTER — Ambulatory Visit (INDEPENDENT_AMBULATORY_CARE_PROVIDER_SITE_OTHER): Payer: Medicaid Other | Admitting: Family Medicine

## 2017-07-12 ENCOUNTER — Telehealth: Payer: Self-pay | Admitting: Family Medicine

## 2017-07-12 VITALS — BP 130/80 | HR 92 | Temp 97.8°F | Resp 18 | Ht 63.75 in | Wt 337.0 lb

## 2017-07-12 DIAGNOSIS — B372 Candidiasis of skin and nail: Secondary | ICD-10-CM | POA: Diagnosis not present

## 2017-07-12 MED ORDER — NYSTATIN 100000 UNIT/GM EX POWD: Freq: Four times a day (QID) | CUTANEOUS | 11 refills | Status: DC

## 2017-07-12 MED ORDER — NYSTATIN 100000 UNIT/GM EX CREA: 1.0000 "application " | TOPICAL_CREAM | Freq: Two times a day (BID) | CUTANEOUS | 11 refills | Status: DC

## 2017-07-12 NOTE — Progress Notes (Signed)
    Chief Complaint  Patient presents with  . Rash    x 3 weeks   Rash for two weeks under breasts, red and raw, itchy Has been perspiring outdoors Otherwise is well Has reduced smoking  Patient Active Problem List   Diagnosis Date Noted  . Vitamin D deficiency 10/24/2016  . Hyperlipidemia 10/24/2016  . Diabetes mellitus type 2 in obese (HCC) 09/22/2016  . Morbid obesity (HCC) 09/22/2016  . DUB (dysfunctional uterine bleeding) 09/22/2016  . Tobacco abuse 09/22/2016  . Allergic rhinitis 03/16/2014  . Seasonal allergies 04/16/2013    Outpatient Encounter Prescriptions as of 07/12/2017  Medication Sig  . ACCU-CHEK AVIVA PLUS test strip USE TO CHECK FASTING BLOOD SUGAR ONCE QAM  . ACCU-CHEK SOFTCLIX LANCETS lancets USE AS DIRECTED TO CHECK FASTING BLOOD SUGAR ONCE DAILY IN THE MORNING  . cholecalciferol (VITAMIN D) 1000 units tablet Take 2,000 Units by mouth daily.  Marland Kitchen. loratadine (CLARITIN) 10 MG tablet Take 1 tablet (10 mg total) by mouth daily.  . megestrol (MEGACE) 40 MG tablet TAKE 3 TABLETS BY MOUTH DAILY X 5 DAYS, TAKE 2 TABLETS DAILY X 5 DAYS, THEN TAKE 1 TABLET DAILY  . metFORMIN (GLUCOPHAGE) 1000 MG tablet Take 1 tablet (1,000 mg total) by mouth 2 (two) times daily with a meal.  . sitaGLIPtin (JANUVIA) 100 MG tablet Take 1 tablet (100 mg total) by mouth daily.  Marland Kitchen. nystatin (MYCOSTATIN/NYSTOP) powder Apply topically 4 (four) times daily.  Marland Kitchen. nystatin cream (MYCOSTATIN) Apply 1 application topically 2 (two) times daily.   No facility-administered encounter medications on file as of 07/12/2017.     Allergies  Allergen Reactions  . Solu-Medrol [Methylprednisolone Acetate]     Rash, lip swelling     Review of Systems  Constitutional: Negative for chills, fever and unexpected weight change.  Respiratory: Negative for shortness of breath and wheezing.   Cardiovascular: Negative for chest pain and leg swelling.  Gastrointestinal: Negative for constipation and diarrhea.    Genitourinary: Negative for difficulty urinating and vaginal discharge.  Skin: Positive for rash.  All other systems reviewed and are negative.   BP 130/80 (BP Location: Right Arm, Patient Position: Sitting, Cuff Size: Large)   Pulse 92   Temp 97.8 F (36.6 C) (Temporal)   Resp 18   Ht 5' 3.75" (1.619 m)   Wt (!) 337 lb (152.9 kg)   SpO2 100%   BMI 58.30 kg/m   Physical Exam  Constitutional: She is oriented to person, place, and time. She appears well-developed and well-nourished.  HENT:  Head: Normocephalic and atraumatic.  Mouth/Throat: Oropharynx is clear and moist.  Cardiovascular: Normal rate and regular rhythm.   Pulmonary/Chest: Effort normal and breath sounds normal.  Abdominal: Soft. Bowel sounds are normal. There is no tenderness.  Neurological: She is alert and oriented to person, place, and time.  Skin: Skin is warm and dry.  Under breasts erythema and irritation with satellite lesions  Psychiatric: She has a normal mood and affect. Her behavior is normal.    ASSESSMENT/PLAN:  1. Candidal intertrigo Discussed treatement and prevention   Patient Instructions  Use cream until the rash  Clears up Then use the powder for prevention   Eustace MooreYvonne Sue Jervis Trapani, MD

## 2017-07-12 NOTE — Telephone Encounter (Signed)
She is seeing dr Delton Seenelson at 1520 today

## 2017-07-12 NOTE — Patient Instructions (Signed)
Use cream until the rash  Clears up Then use the powder for prevention

## 2017-07-12 NOTE — Telephone Encounter (Signed)
Patient has rash under both breasts (like a heat rash). She states it is raw and burns.  The bra irritates it.  She noticed this about 3 weeks ago.  In the beginning she put neosporin on it and thought it had cleared up, but it has started back again.  She is requesting appt with Dr. Lodema HongSimpson this afternoon.  Please advise as no slots are open.  Can something can be called in without being seen? She is also open to seeing another doctor today or tomorrow.

## 2017-07-16 ENCOUNTER — Encounter: Payer: Self-pay | Admitting: "Endocrinology

## 2017-07-16 ENCOUNTER — Ambulatory Visit: Payer: Medicaid Other | Admitting: "Endocrinology

## 2017-07-21 LAB — BASIC METABOLIC PANEL: Glucose: 289

## 2017-08-11 ENCOUNTER — Emergency Department (HOSPITAL_COMMUNITY): Payer: Medicaid Other

## 2017-08-11 ENCOUNTER — Emergency Department (HOSPITAL_COMMUNITY)
Admission: EM | Admit: 2017-08-11 | Discharge: 2017-08-11 | Disposition: A | Discharge status: Home / Self Care | Payer: Medicaid Other | Attending: Emergency Medicine | Admitting: Emergency Medicine

## 2017-08-11 ENCOUNTER — Encounter (HOSPITAL_COMMUNITY): Payer: Self-pay | Admitting: Emergency Medicine

## 2017-08-11 DIAGNOSIS — F17210 Nicotine dependence, cigarettes, uncomplicated: Secondary | ICD-10-CM | POA: Insufficient documentation

## 2017-08-11 DIAGNOSIS — Z79899 Other long term (current) drug therapy: Secondary | ICD-10-CM | POA: Diagnosis not present

## 2017-08-11 DIAGNOSIS — E119 Type 2 diabetes mellitus without complications: Secondary | ICD-10-CM | POA: Insufficient documentation

## 2017-08-11 DIAGNOSIS — R05 Cough: Secondary | ICD-10-CM | POA: Insufficient documentation

## 2017-08-11 DIAGNOSIS — Z7984 Long term (current) use of oral hypoglycemic drugs: Secondary | ICD-10-CM | POA: Diagnosis not present

## 2017-08-11 DIAGNOSIS — J069 Acute upper respiratory infection, unspecified: Secondary | ICD-10-CM | POA: Insufficient documentation

## 2017-08-11 DIAGNOSIS — J208 Acute bronchitis due to other specified organisms: Secondary | ICD-10-CM

## 2017-08-11 DIAGNOSIS — B9789 Other viral agents as the cause of diseases classified elsewhere: Secondary | ICD-10-CM

## 2017-08-11 MED ORDER — ALBUTEROL SULFATE HFA 108 (90 BASE) MCG/ACT IN AERS
2.0000 | INHALATION_SPRAY | Freq: Once | RESPIRATORY_TRACT | Status: AC
  Administered 2017-08-11: 2 via RESPIRATORY_TRACT
  Filled 2017-08-11: qty 6.7

## 2017-08-11 MED ORDER — IPRATROPIUM-ALBUTEROL 0.5-2.5 (3) MG/3ML IN SOLN
3.0000 mL | Freq: Once | RESPIRATORY_TRACT | Status: AC
  Administered 2017-08-11: 3 mL via RESPIRATORY_TRACT
  Filled 2017-08-11: qty 3

## 2017-08-11 NOTE — Discharge Instructions (Signed)
You have a virus causing you to have bronchitis. This will take 1-2 weeks to get fully better. You can use your albuterol inhaler 2-4 puffs every 4-6 hours as needed for coughing or shortness of breath or wheezing. Your chest x-ray does not show any pneumonia and you do not need any antibiotics today. Follow-up with her primary care doctor as needed. Return for worsening symptoms, including new fevers, difficulty breathing, confusion, or any other symptoms concerning to you.

## 2017-08-11 NOTE — ED Provider Notes (Signed)
[facility] DEPT Provider Note   CSN: [id number] Arrival date & time: 08/11/17  0830     History   Chief Complaint Chief Complaint  Patient presents with  . Cough    HPI Selena Barr is a 26 y.o. female.  The history is provided by the patient.  Cough  This is a new problem. The current episode started more than 2 days ago. The problem occurs constantly. The problem has not changed since onset.The cough is productive of sputum. There has been no fever. Associated symptoms include chills, rhinorrhea, sore throat and wheezing. Pertinent negatives include no headaches. She has tried nothing for the symptoms. She is a smoker.   26 year old female who presents with cough, congestion, runny nose and sore throat. Symptoms have been ongoing for 2 days. Her daughter recently had a your upper respiratory infection. She has a history of diabetes, obesity, and current tobacco use. Reports yellow sputum. No fevers. No nausea vomiting, abdominal pain, but having diarrhea. No urinary complaints. Reports burning in her chest with cough. No alleviating or aggravating factors. No treatments tried.  Past Medical History:  Diagnosis Date  . Allergy   . Diabetes mellitus without complication (HCC)   . Hyperlipidemia 10/24/2016  . Tobacco abuse 09/22/2016    Patient Active Problem List   Diagnosis Date Noted  . Vitamin D deficiency 10/24/2016  . Hyperlipidemia 10/24/2016  . Diabetes mellitus type 2 in obese (HCC) 09/22/2016  . Morbid obesity (HCC) 09/22/2016  . DUB (dysfunctional uterine bleeding) 09/22/2016  . Tobacco abuse 09/22/2016  . Allergic rhinitis 03/16/2014  . Seasonal allergies 04/16/2013    History reviewed. No pertinent surgical history.  OB History    Gravida Para Term Preterm AB Living   1 1 1     1    SAB TAB Ectopic Multiple Live Births                   Home Medications    Prior to Admission medications   Medication Sig Start Date End Date Taking?  Authorizing Provider  ACCU-CHEK AVIVA PLUS test strip USE TO CHECK FASTING BLOOD SUGAR ONCE QAM 08/22/16   [provider]  ACCU-CHEK SOFTCLIX LANCETS lancets USE AS DIRECTED TO CHECK FASTING BLOOD SUGAR ONCE DAILY IN THE MORNING 08/22/16   [provider]  cholecalciferol (VITAMIN D) 1000 units tablet Take 2,000 Units by mouth daily.    [provider]  loratadine (CLARITIN) 10 MG tablet Take 1 tablet (10 mg total) by mouth daily. 04/05/16   Loren RacerYelverton, David, MD  megestrol (MEGACE) 40 MG tablet TAKE 3 TABLETS BY MOUTH DAILY X 5 DAYS, TAKE 2 TABLETS DAILY X 5 DAYS, THEN TAKE 1 TABLET DAILY 02/09/17   Lazaro ArmsEure, Luther H, MD  metFORMIN (GLUCOPHAGE) 1000 MG tablet Take 1 tablet (1,000 mg total) by mouth 2 (two) times daily with a meal. 10/24/16   Eustace MooreNelson, Yvonne Sue, MD  nystatin (MYCOSTATIN/NYSTOP) powder Apply topically 4 (four) times daily. 07/12/17   Eustace MooreNelson, Yvonne Sue, MD  nystatin cream (MYCOSTATIN) Apply 1 application topically 2 (two) times daily. 07/12/17   Eustace MooreNelson, Yvonne Sue, MD  sitaGLIPtin (JANUVIA) 100 MG tablet Take 1 tablet (100 mg total) by mouth daily. 02/08/17   Eustace MooreNelson, Yvonne Sue, MD    Family History Family History  Problem Relation Age of Onset  . Diabetes Mother   . Alcohol abuse Mother   . COPD Mother   . Drug abuse Mother   . Cancer Maternal Grandmother  throat  . Cancer Maternal Grandfather        throat  . Diabetes Maternal Grandfather     Social History Social History  Substance Use Topics  . Smoking status: Current Every Day Smoker    Packs/day: 0.50    Years: 2.00    Types: Cigarettes  . Smokeless tobacco: Never Used  . Alcohol use No     Allergies   Solu-medrol [methylprednisolone acetate]   Review of Systems Review of Systems  Constitutional: Positive for chills. Negative for fever.  HENT: Positive for rhinorrhea and sore throat.   Respiratory: Positive for cough and wheezing.   Gastrointestinal: Positive for diarrhea.    Genitourinary: Negative for dysuria.  Neurological: Negative for headaches.     Physical Exam Updated Vital Signs BP (!) 141/85 (BP Location: Right Arm)   Pulse 85   Temp 98.2 F (36.8 C) (Oral)   Resp 20   Ht  (1.6 m)   Wt (!) 157.4 kg (347 lb)   SpO2 95%   BMI 61.47 kg/m   Physical Exam Physical Exam  Nursing note and vitals reviewed. Constitutional: Well developed, well nourished, non-toxic, and in no acute distress Head: Normocephalic and atraumatic.  Mouth/Throat: Oropharynx is clear and moist.  Neck: Normal range of motion. Neck supple.  Cardiovascular: Normal rate and regular rhythm.   Pulmonary/Chest: Effort normal. Scattered expiratory wheezing. Abdominal: Soft. There is no tenderness. There is no rebound and no guarding.  Musculoskeletal: Normal range of motion.  Neurological: Alert, no facial droop, fluent speech, moves all extremities symmetrically Skin: Skin is warm and dry.  Psychiatric: Cooperative   ED Treatments / Results  Labs (all labs ordered are listed, but only abnormal results are displayed) Labs Reviewed - No data to display  EKG  EKG Interpretation None       Radiology Dg Chest 2 View  Result Date: 08/11/2017 CLINICAL DATA:  Productive cough. EXAM: CHEST  2 VIEW COMPARISON:  Radiographs of November 23, 2016. FINDINGS: The heart size and mediastinal contours are within normal limits. Both lungs are clear. No pneumothorax or pleural effusion is noted. The visualized skeletal structures are unremarkable. IMPRESSION: No active cardiopulmonary disease. Electronically Signed   By: Lupita Raider, M.D.   On: 08/11/2017 08:56    Procedures Procedures (including critical care time)  Medications Ordered in ED Medications  albuterol (PROVENTIL HFA;VENTOLIN HFA) 108 (90 Base) MCG/ACT inhaler 2 puff (not administered)  ipratropium-albuterol (DUONEB) 0.5-2.5 (3) MG/3ML nebulizer solution 3 mL (3 mLs Nebulization Given 08/11/17 0902)      Initial Impression / Assessment and Plan / ED Course  I have reviewed the triage vital signs and the nursing notes.  Pertinent labs & imaging results that were available during my care of the patient were reviewed by me and considered in my medical decision making (see chart for details).     Presents with cough, congestion, runny nose, sore throat w/ diarrhea x 2 days. Presentation c/w viral bronchitis or viral respiratory illness.  She is nontoxic in no acute distress. Normal work of breathing and oxygenation. Does have some scattered wheezing on exam, and presentation likely consistent with bronchitis. No previous history of asthma.  Given albuterol, with improvement in her symptoms. Lung sounds subsequently clear. Chest x-ray visualized shows no acute cardiopulmonary processes.  Discussed supportive care management for viral illness. Albuterol inhaler given for home as needed. Strict return and follow-up instructions reviewed. She expressed understanding of all discharge instructions and felt comfortable with  the plan of care.   Smoking cessation instruction/counseling given:  counseled patient on the dangers of tobacco use, advised patient to stop smoking, and reviewed strategies to maximize success  Final Clinical Impressions(s) / ED Diagnoses   Final diagnoses:  Viral URI with cough  Viral bronchitis    New Prescriptions New Prescriptions   No medications on file     Lavera Guise, MD 08/11/17 [phone]

## 2017-08-11 NOTE — ED Triage Notes (Signed)
Pt reports productive cough x 2 days with yellow sputum.  Denies fever

## 2017-11-06 ENCOUNTER — Ambulatory Visit: Payer: Medicaid Other | Admitting: Family Medicine

## 2017-11-08 ENCOUNTER — Encounter: Payer: Self-pay | Admitting: Family Medicine

## 2017-11-08 ENCOUNTER — Other Ambulatory Visit: Payer: Self-pay

## 2017-11-08 ENCOUNTER — Ambulatory Visit: Payer: Medicaid Other | Admitting: Family Medicine

## 2017-11-08 VITALS — BP 118/88 | HR 80 | Temp 98.9°F | Resp 18 | Ht 63.75 in | Wt 320.1 lb

## 2017-11-08 DIAGNOSIS — Z23 Encounter for immunization: Secondary | ICD-10-CM | POA: Diagnosis not present

## 2017-11-08 DIAGNOSIS — E785 Hyperlipidemia, unspecified: Secondary | ICD-10-CM | POA: Diagnosis not present

## 2017-11-08 DIAGNOSIS — E669 Obesity, unspecified: Secondary | ICD-10-CM | POA: Diagnosis not present

## 2017-11-08 DIAGNOSIS — M722 Plantar fascial fibromatosis: Secondary | ICD-10-CM | POA: Diagnosis not present

## 2017-11-08 DIAGNOSIS — E1169 Type 2 diabetes mellitus with other specified complication: Secondary | ICD-10-CM | POA: Diagnosis not present

## 2017-11-08 DIAGNOSIS — Z114 Encounter for screening for human immunodeficiency virus [HIV]: Secondary | ICD-10-CM

## 2017-11-08 MED ORDER — MELOXICAM 15 MG PO TABS: 15.0000 mg | ORAL_TABLET | Freq: Every day | ORAL | 0 refills | Status: DC

## 2017-11-08 NOTE — Progress Notes (Signed)
Chief Complaint  Patient presents with  . Foot Injury    right x 2 months  Patient is here for an acute problem.  Her right foot is been progressively more painful for 2 months.  It is painful when she first gets up in the morning.  It hurts in her heel region.  It hurts with prolonged standing and walking at work.  She works in a nursing home.  No trauma or injury.  She states that she wears "memory foam" shoes.  No shoe inserts.  No self treatment.  No prior foot problems. Patient is a diabetic.  She is not entirely compliant with care.  She has not had her regularly scheduled follow-up with endocrinology or her recent lab works.  She is reminded to get her labs today and follow-up with Dr. Fransico HimNida.  She states her sugars have been good. 1 of the patient's problems that predisposes her to plantar fasciitis is morbid obesity.  She has been trying to improve this, and has reduced her weight from 347-320 over the last 3 pounds.  She is congratulated on her effort.   Patient Active Problem List   Diagnosis Date Noted  . Vitamin D deficiency 10/24/2016  . Hyperlipidemia 10/24/2016  . Diabetes mellitus type 2 in obese (HCC) 09/22/2016  . Morbid obesity (HCC) 09/22/2016  . DUB (dysfunctional uterine bleeding) 09/22/2016  . Tobacco abuse 09/22/2016  . Allergic rhinitis 03/16/2014  . Seasonal allergies 04/16/2013    Outpatient Encounter Medications as of 11/08/2017  Medication Sig  . ACCU-CHEK AVIVA PLUS test strip USE TO CHECK FASTING BLOOD SUGAR ONCE QAM  . ACCU-CHEK SOFTCLIX LANCETS lancets USE AS DIRECTED TO CHECK FASTING BLOOD SUGAR ONCE DAILY IN THE MORNING  . cholecalciferol (VITAMIN D) 1000 units tablet Take 2,000 Units by mouth daily.  Marland Kitchen. loratadine (CLARITIN) 10 MG tablet Take 1 tablet (10 mg total) by mouth daily.  . megestrol (MEGACE) 40 MG tablet TAKE 3 TABLETS BY MOUTH DAILY X 5 DAYS, TAKE 2 TABLETS DAILY X 5 DAYS, THEN TAKE 1 TABLET DAILY  . metFORMIN (GLUCOPHAGE) 1000 MG tablet  Take 1 tablet (1,000 mg total) by mouth 2 (two) times daily with a meal.  . nystatin (MYCOSTATIN/NYSTOP) powder Apply topically 4 (four) times daily.  Marland Kitchen. nystatin cream (MYCOSTATIN) Apply 1 application topically 2 (two) times daily.  . sitaGLIPtin (JANUVIA) 100 MG tablet Take 1 tablet (100 mg total) by mouth daily.  . meloxicam (MOBIC) 15 MG tablet Take 1 tablet (15 mg total) by mouth daily.   No facility-administered encounter medications on file as of 11/08/2017.     Allergies  Allergen Reactions  . Solu-Medrol [Methylprednisolone Acetate]     Rash, lip swelling     Review of Systems  Constitutional: Negative for chills and fever.  HENT: Negative for congestion and dental problem.   Eyes: Negative for redness and visual disturbance.  Respiratory: Negative for cough and shortness of breath.   Cardiovascular: Negative for chest pain and palpitations.  Gastrointestinal: Negative for nausea and vomiting.  Musculoskeletal: Positive for gait problem. Negative for arthralgias and back pain.       Right foot pain  Skin: Negative for rash and wound.  All other systems reviewed and are negative.   BP 118/88 (BP Location: Left Arm, Patient Position: Sitting, Cuff Size: Large)   Pulse 80   Temp 98.9 F (37.2 C) (Temporal)   Resp 18   Ht 5' 3.75" (1.619 m)   Wt (!) 320  lb 1.3 oz (145.2 kg)   SpO2 98%   BMI 55.37 kg/m   Physical Exam  Constitutional: She is oriented to person, place, and time. She appears well-developed and well-nourished.  HENT:  Head: Normocephalic and atraumatic.  Mouth/Throat: Oropharynx is clear and moist.  Cardiovascular: Normal rate and regular rhythm.  Pulmonary/Chest: Effort normal and breath sounds normal.  Abdominal: Soft. Bowel sounds are normal. There is no tenderness.  Musculoskeletal:  Tenderness at the medial heel, plantar region consistent with an area of plantar fascia insertion.  Pain with foot and toe with dorsal flexion.  No callus.    Neurological: She is alert and oriented to person, place, and time.  Skin: Skin is warm and dry.  Skin on feet is dry  Psychiatric: She has a normal mood and affect. Her behavior is normal.    ASSESSMENT/PLAN:  1. Plantar fasciitis, right Discussed conservative treatment  2. Diabetes mellitus type 2 in obese (HCC) Overdue for lab work and follow-up - CBC - COMPLETE METABOLIC PANEL WITH GFR - Hemoglobin A1c - Lipid panel - Microalbumin / creatinine urine ratio - Urinalysis, Routine w reflex microscopic  3. Hyperlipidemia, unspecified hyperlipidemia type On statin  4. Morbid obesity (HCC) Reviewed  5. Screening for HIV without presence of risk factors Patient request - HIV antibody (with reflex)  6. Needs flu shoe - Done - Flu Vaccine QUAD 36+ mos IM  7. Need for pneumococcal vaccination - Pneumococcal conjugate vaccine 13-valent IM   Patient Instructions  Take mobic once a day with food Stretch the foot several times a day Ice to foot every night after work for [employer] Need heel pad in shoe  Lab tests today Make follow up with Dr Fransico Him  See me in a month   Plantar Fasciitis Plantar fasciitis is a painful foot condition that affects the heel. It occurs when the band of tissue that connects the toes to the heel bone (plantar fascia) becomes irritated. This can happen after exercising too much or doing other repetitive activities (overuse injury). The pain from plantar fasciitis can range from mild irritation to severe pain that makes it difficult for you to walk or move. The pain is usually worse in the morning or after you have been sitting or lying down for a while. What are the causes? This condition may be caused by:  Standing for long periods of time.  Wearing shoes that do not fit.  Doing high-impact activities, including running, aerobics, and ballet.  Being overweight.  Having an abnormal way of walking (gait).  Having tight calf  muscles.  Having high arches in your feet.  Starting a new athletic activity.  What are the signs or symptoms? The main symptom of this condition is heel pain. Other symptoms include:  Pain that gets worse after activity or exercise.  Pain that is worse in the morning or after resting.  Pain that goes away after you walk for a few minutes.  How is this diagnosed? This condition may be diagnosed based on your signs and symptoms. Your health care provider will also do a physical exam to check for:  A tender area on the bottom of your foot.  A high arch in your foot.  Pain when you move your foot.  Difficulty moving your foot.  You may also need to have imaging studies to confirm the diagnosis. These can include:  X-rays.  Ultrasound.  MRI.  How is this treated? Treatment for plantar fasciitis depends on  the severity of the condition. Your treatment may include:  Rest, ice, and over-the-counter pain medicines to manage your pain.  Exercises to stretch your calves and your plantar fascia.  A splint that holds your foot in a stretched, upward position while you sleep (night splint).  Physical therapy to relieve symptoms and prevent problems in the future.  Cortisone injections to relieve severe pain.  Extracorporeal shock wave therapy (ESWT) to stimulate damaged plantar fascia with electrical impulses. It is often used as a last resort before surgery.  Surgery, if other treatments have not worked after 12 months.  Follow these instructions at home:  Take medicines only as directed by your health care provider.  Avoid activities that cause pain.  Roll the bottom of your foot over a bag of ice or a bottle of cold water. Do this for 20 minutes, 3-4 times a day.  Perform simple stretches as directed by your health care provider.  Try wearing athletic shoes with air-sole or gel-sole cushions or soft shoe inserts.  Wear a night splint while sleeping, if directed by  your health care provider.  Keep all follow-up appointments with your health care provider. How is this prevented?  Do not perform exercises or activities that cause heel pain.  Consider finding low-impact activities if you continue to have problems.  Lose weight if you need to. The best way to prevent plantar fasciitis is to avoid the activities that aggravate your plantar fascia. Contact a health care provider if:  Your symptoms do not go away after treatment with home care measures.  Your pain gets worse.  Your pain affects your ability to move or do your daily activities. This information is not intended to replace advice given to you by your health care provider. Make sure you discuss any questions you have with your health care provider. Document Released: 08/15/2001 Document Revised: 04/24/2016 Document Reviewed: 09/30/2014 Elsevier Interactive Patient Education  2018 [employer]  Plantar Fasciitis Rehab Ask your health care provider which exercises are safe for you. Do exercises exactly as told by your health care provider and adjust them as directed. It is normal to feel mild stretching, pulling, tightness, or discomfort as you do these exercises, but you should stop right away if you feel sudden pain or your pain gets worse. Do not begin these exercises until told by your health care provider. Stretching and range of motion exercises These exercises warm up your muscles and joints and improve the movement and flexibility of your foot. These exercises also help to relieve pain. Exercise A: Plantar fascia stretch  1. Sit with your left / right leg crossed over your opposite knee. 2. Hold your heel with one hand with that thumb near your arch. With your other hand, hold your toes and gently pull them back toward the top of your foot. You should feel a stretch on the bottom of your toes or your foot or both. 3. Hold this stretch for___15_______ seconds. 4. Slowly release your  toes and return to the starting position. Repeat ____10______ times. Complete this exercise _____2_____ times a day. Exercise B: Gastroc, standing  1. Stand with your hands against a wall. 2. Extend your left / right leg behind you, and bend your front knee slightly. 3. Keeping your heels on the floor and keeping your back knee straight, shift your weight toward the wall without arching your back. You should feel a gentle stretch in your left / right calf. 4. Hold this position for  ____15______ seconds. Repeat ____10______ times. Complete this exercise _____2____ times a day. Exercise C: Soleus, standing 1. Stand with your hands against a wall. 2. Extend your left / right leg behind you, and bend your front knee slightly. 3. Keeping your heels on the floor, bend your back knee and slightly shift your weight over the back leg. You should feel a gentle stretch deep in your calf. 4. Hold this position for ___15_______ seconds. Repeat _____10_____ times. Complete this exercise ____2______ times a day. Exercise D: Gastrocsoleus, standing 1. Stand with the ball of your left / right foot on a step. The ball of your foot is on the walking surface, right under your toes. 2. Keep your other foot firmly on the same step. 3. Hold onto the wall or a railing for balance. 4. Slowly lift your other foot, allowing your body weight to press your heel down over the edge of the step. You should feel a stretch in your left / right calf. 5. Hold this position for ____15______ seconds. 6. Return both feet to the step. 7. Repeat this exercise with a slight bend in your left / right knee. Repeat ____10______ times with your left / right knee straight and ___10_______ times with your left / right knee bent. Complete this exercise _____2_____ times a day. Balance exercise This exercise builds your balance and strength control of your arch to help take pressure off your plantar fascia. Exercise E: Single leg  stand 1. Without shoes, stand near a railing or in a doorway. You may hold onto the railing or door frame as needed. 2. Stand on your left / right foot. Keep your big toe down on the floor and try to keep your arch lifted. Do not let your foot roll inward. 3. Hold this position for __15________ seconds. 4. If this exercise is too easy, you can try it with your eyes closed or while standing on a pillow. Repeat __________ times. Complete this exercise __________ times a day. This information is not intended to replace advice given to you by your health care provider. Make sure you discuss any questions you have with your health care provider. Document Released: 11/20/2005 Document Revised: 07/25/2016 Document Reviewed: 10/04/2015 Elsevier Interactive Patient Education  2018 [employer]    Eustace MooreYvonne Sue Kolden Dupee, MD

## 2017-11-08 NOTE — Patient Instructions (Addendum)
Take mobic once a day with food Stretch the foot several times a day Ice to foot every night after work for [employer]  Min Need heel pad in shoe  Lab tests today Make follow up with Dr Fransico HimNida  See me in a month   Plantar Fasciitis Plantar fasciitis is a painful foot condition that affects the heel. It occurs when the band of tissue that connects the toes to the heel bone (plantar fascia) becomes irritated. This can happen after exercising too much or doing other repetitive activities (overuse injury). The pain from plantar fasciitis can range from mild irritation to severe pain that makes it difficult for you to walk or move. The pain is usually worse in the morning or after you have been sitting or lying down for a while. What are the causes? This condition may be caused by:  Standing for long periods of time.  Wearing shoes that do not fit.  Doing high-impact activities, including running, aerobics, and ballet.  Being overweight.  Having an abnormal way of walking (gait).  Having tight calf muscles.  Having high arches in your feet.  Starting a new athletic activity.  What are the signs or symptoms? The main symptom of this condition is heel pain. Other symptoms include:  Pain that gets worse after activity or exercise.  Pain that is worse in the morning or after resting.  Pain that goes away after you walk for a few minutes.  How is this diagnosed? This condition may be diagnosed based on your signs and symptoms. Your health care provider will also do a physical exam to check for:  A tender area on the bottom of your foot.  A high arch in your foot.  Pain when you move your foot.  Difficulty moving your foot.  You may also need to have imaging studies to confirm the diagnosis. These can include:  X-rays.  Ultrasound.  MRI.  How is this treated? Treatment for plantar fasciitis depends on the severity of the condition. Your treatment may include:  Rest, ice, and  over-the-counter pain medicines to manage your pain.  Exercises to stretch your calves and your plantar fascia.  A splint that holds your foot in a stretched, upward position while you sleep (night splint).  Physical therapy to relieve symptoms and prevent problems in the future.  Cortisone injections to relieve severe pain.  Extracorporeal shock wave therapy (ESWT) to stimulate damaged plantar fascia with electrical impulses. It is often used as a last resort before surgery.  Surgery, if other treatments have not worked after 12 months.  Follow these instructions at home:  Take medicines only as directed by your health care provider.  Avoid activities that cause pain.  Roll the bottom of your foot over a bag of ice or a bottle of cold water. Do this for 20 minutes, 3-4 times a day.  Perform simple stretches as directed by your health care provider.  Try wearing athletic shoes with air-sole or gel-sole cushions or soft shoe inserts.  Wear a night splint while sleeping, if directed by your health care provider.  Keep all follow-up appointments with your health care provider. How is this prevented?  Do not perform exercises or activities that cause heel pain.  Consider finding low-impact activities if you continue to have problems.  Lose weight if you need to. The best way to prevent plantar fasciitis is to avoid the activities that aggravate your plantar fascia. Contact a health care provider if:  Your  symptoms do not go away after treatment with home care measures.  Your pain gets worse.  Your pain affects your ability to move or do your daily activities. This information is not intended to replace advice given to you by your health care provider. Make sure you discuss any questions you have with your health care provider. Document Released: 08/15/2001 Document Revised: 04/24/2016 Document Reviewed: 09/30/2014 Elsevier Interactive Patient Education  2018 [employer]  Plantar Fasciitis Rehab Ask your health care provider which exercises are safe for you. Do exercises exactly as told by your health care provider and adjust them as directed. It is normal to feel mild stretching, pulling, tightness, or discomfort as you do these exercises, but you should stop right away if you feel sudden pain or your pain gets worse. Do not begin these exercises until told by your health care provider. Stretching and range of motion exercises These exercises warm up your muscles and joints and improve the movement and flexibility of your foot. These exercises also help to relieve pain. Exercise A: Plantar fascia stretch  1. Sit with your left / right leg crossed over your opposite knee. 2. Hold your heel with one hand with that thumb near your arch. With your other hand, hold your toes and gently pull them back toward the top of your foot. You should feel a stretch on the bottom of your toes or your foot or both. 3. Hold this stretch for___15_______ seconds. 4. Slowly release your toes and return to the starting position. Repeat ____10______ times. Complete this exercise _____2_____ times a day. Exercise B: Gastroc, standing  1. Stand with your hands against a wall. 2. Extend your left / right leg behind you, and bend your front knee slightly. 3. Keeping your heels on the floor and keeping your back knee straight, shift your weight toward the wall without arching your back. You should feel a gentle stretch in your left / right calf. 4. Hold this position for ____15______ seconds. Repeat ____10______ times. Complete this exercise _____2____ times a day. Exercise C: Soleus, standing 1. Stand with your hands against a wall. 2. Extend your left / right leg behind you, and bend your front knee slightly. 3. Keeping your heels on the floor, bend your back knee and slightly shift your weight over the back leg. You should feel a gentle stretch deep in your calf. 4. Hold this  position for ___15_______ seconds. Repeat _____10_____ times. Complete this exercise ____2______ times a day. Exercise D: Gastrocsoleus, standing 1. Stand with the ball of your left / right foot on a step. The ball of your foot is on the walking surface, right under your toes. 2. Keep your other foot firmly on the same step. 3. Hold onto the wall or a railing for balance. 4. Slowly lift your other foot, allowing your body weight to press your heel down over the edge of the step. You should feel a stretch in your left / right calf. 5. Hold this position for ____15______ seconds. 6. Return both feet to the step. 7. Repeat this exercise with a slight bend in your left / right knee. Repeat ____10______ times with your left / right knee straight and ___10_______ times with your left / right knee bent. Complete this exercise _____2_____ times a day. Balance exercise This exercise builds your balance and strength control of your arch to help take pressure off your plantar fascia. Exercise E: Single leg stand 1. Without shoes, stand near a railing  or in a doorway. You may hold onto the railing or door frame as needed. 2. Stand on your left / right foot. Keep your big toe down on the floor and try to keep your arch lifted. Do not let your foot roll inward. 3. Hold this position for __15________ seconds. 4. If this exercise is too easy, you can try it with your eyes closed or while standing on a pillow. Repeat __________ times. Complete this exercise __________ times a day. This information is not intended to replace advice given to you by your health care provider. Make sure you discuss any questions you have with your health care provider. Document Released: 11/20/2005 Document Revised: 07/25/2016 Document Reviewed: 10/04/2015 Elsevier Interactive Patient Education  2018 [employer].

## 2017-11-09 ENCOUNTER — Encounter: Payer: Self-pay | Admitting: Family Medicine

## 2017-11-09 LAB — CBC
HEMATOCRIT: 44.2 % (ref 35.0–45.0)
HEMOGLOBIN: 14.6 g/dL (ref 11.7–15.5)
MCH: 28.1 pg (ref 27.0–33.0)
MCHC: 33 g/dL (ref 32.0–36.0)
MCV: 85.2 fL (ref 80.0–100.0)
MPV: 9.9 fL (ref 7.5–12.5)
Platelets: 483 10*3/uL — ABNORMAL HIGH (ref 140–400)
RBC: 5.19 10*6/uL — AB (ref 3.80–5.10)
RDW: 13 % (ref 11.0–15.0)
WBC: 10.8 10*3/uL (ref 3.8–10.8)

## 2017-11-09 LAB — URINALYSIS, ROUTINE W REFLEX MICROSCOPIC
BILIRUBIN URINE: NEGATIVE
GLUCOSE, UA: NEGATIVE
HGB URINE DIPSTICK: NEGATIVE
Hyaline Cast: NONE SEEN /LPF
KETONES UR: NEGATIVE
NITRITE: NEGATIVE
PH: 7 (ref 5.0–8.0)
Protein, ur: NEGATIVE
Specific Gravity, Urine: 1.023 (ref 1.001–1.03)

## 2017-11-09 LAB — MICROALBUMIN / CREATININE URINE RATIO
Creatinine, Urine: 233 mg/dL (ref 20–275)
MICROALB/CREAT RATIO: 7 ug/mg{creat} (ref <30)
Microalb, Ur: 1.7 mg/dL

## 2017-11-09 LAB — COMPLETE METABOLIC PANEL WITH GFR
AG RATIO: 1.2 (calc) (ref 1.0–2.5)
ALBUMIN MSPROF: 3.9 g/dL (ref 3.6–5.1)
ALT: 16 U/L (ref 6–29)
AST: 14 U/L (ref 10–30)
Alkaline phosphatase (APISO): 86 U/L (ref 33–115)
BUN: 11 mg/dL (ref 7–25)
CALCIUM: 9.5 mg/dL (ref 8.6–10.2)
CO2: 28 mmol/L (ref 20–32)
Chloride: 104 mmol/L (ref 98–110)
Creat: 0.64 mg/dL (ref 0.50–1.10)
GFR, EST AFRICAN AMERICAN: 144 mL/min/{1.73_m2} (ref >=60)
GFR, EST NON AFRICAN AMERICAN: 124 mL/min/{1.73_m2} (ref >=60)
GLOBULIN: 3.3 g/dL (ref 1.9–3.7)
Glucose, Bld: 166 mg/dL — ABNORMAL HIGH (ref 65–139)
POTASSIUM: 4.1 mmol/L (ref 3.5–5.3)
SODIUM: 139 mmol/L (ref 135–146)
TOTAL PROTEIN: 7.2 g/dL (ref 6.1–8.1)
Total Bilirubin: 0.4 mg/dL (ref 0.2–1.2)

## 2017-11-09 LAB — HEMOGLOBIN A1C
Hgb A1c MFr Bld: 7.3 % of total Hgb — ABNORMAL HIGH (ref <5.7)
MEAN PLASMA GLUCOSE: 163 (calc)
eAG (mmol/L): 9 (calc)

## 2017-11-09 LAB — LIPID PANEL
Cholesterol: 181 mg/dL (ref <200)
HDL: 28 mg/dL — AB (ref >50)
LDL Cholesterol (Calc): 131 mg/dL (calc) — ABNORMAL HIGH
Non-HDL Cholesterol (Calc): 153 mg/dL (calc) — ABNORMAL HIGH (ref <130)
TRIGLYCERIDES: 112 mg/dL (ref <150)
Total CHOL/HDL Ratio: 6.5 (calc) — ABNORMAL HIGH (ref <5.0)

## 2017-11-09 LAB — HIV ANTIBODY (ROUTINE TESTING W REFLEX): HIV 1&2 Ab, 4th Generation: NONREACTIVE

## 2017-12-05 ENCOUNTER — Other Ambulatory Visit: Payer: Self-pay | Admitting: "Endocrinology

## 2017-12-05 DIAGNOSIS — E1165 Type 2 diabetes mellitus with hyperglycemia: Secondary | ICD-10-CM

## 2017-12-05 DIAGNOSIS — E039 Hypothyroidism, unspecified: Secondary | ICD-10-CM

## 2017-12-10 ENCOUNTER — Ambulatory Visit: Payer: Medicaid Other | Admitting: Family Medicine

## 2017-12-19 ENCOUNTER — Other Ambulatory Visit: Payer: Self-pay

## 2017-12-19 ENCOUNTER — Encounter: Payer: Self-pay | Admitting: Family Medicine

## 2017-12-19 ENCOUNTER — Ambulatory Visit: Payer: Medicaid Other | Admitting: Family Medicine

## 2017-12-19 VITALS — BP 122/84 | HR 68 | Temp 98.0°F | Resp 18 | Ht 63.75 in | Wt 315.0 lb

## 2017-12-19 DIAGNOSIS — E669 Obesity, unspecified: Secondary | ICD-10-CM

## 2017-12-19 DIAGNOSIS — E1169 Type 2 diabetes mellitus with other specified complication: Secondary | ICD-10-CM

## 2017-12-19 DIAGNOSIS — M722 Plantar fascial fibromatosis: Secondary | ICD-10-CM | POA: Diagnosis not present

## 2017-12-19 DIAGNOSIS — E785 Hyperlipidemia, unspecified: Secondary | ICD-10-CM

## 2017-12-19 LAB — COMPREHENSIVE METABOLIC PANEL
AG RATIO: 1.1 (calc) (ref 1.0–2.5)
ALT: 15 U/L (ref 6–29)
AST: 14 U/L (ref 10–30)
Albumin: 3.6 g/dL (ref 3.6–5.1)
Alkaline phosphatase (APISO): 74 U/L (ref 33–115)
BUN: 9 mg/dL (ref 7–25)
CHLORIDE: 105 mmol/L (ref 98–110)
CO2: 29 mmol/L (ref 20–32)
Calcium: 9.2 mg/dL (ref 8.6–10.2)
Creat: 0.7 mg/dL (ref 0.50–1.10)
GLOBULIN: 3.2 g/dL (ref 1.9–3.7)
GLUCOSE: 112 mg/dL (ref 65–139)
POTASSIUM: 3.9 mmol/L (ref 3.5–5.3)
SODIUM: 141 mmol/L (ref 135–146)
Total Bilirubin: 0.4 mg/dL (ref 0.2–1.2)
Total Protein: 6.8 g/dL (ref 6.1–8.1)

## 2017-12-19 LAB — TSH: TSH: 1.17 mIU/L

## 2017-12-19 LAB — T4, FREE: FREE T4: 1.7 ng/dL (ref 0.8–1.8)

## 2017-12-19 MED ORDER — SITAGLIPTIN PHOSPHATE 100 MG PO TABS: 100.0000 mg | ORAL_TABLET | Freq: Every day | ORAL | 3 refills | Status: DC

## 2017-12-19 MED ORDER — METFORMIN HCL 1000 MG PO TABS: 1000.0000 mg | ORAL_TABLET | Freq: Two times a day (BID) | ORAL | 3 refills | Status: DC

## 2017-12-19 MED ORDER — MELOXICAM 15 MG PO TABS: 15.0000 mg | ORAL_TABLET | Freq: Every day | ORAL | 0 refills | Status: DC

## 2017-12-19 NOTE — Patient Instructions (Signed)
You are doing well congratulations on the weight loss and improvement in your diabetes See me in six months Call sooner for problems

## 2017-12-19 NOTE — Progress Notes (Signed)
Chief Complaint  Patient presents with  . Follow-up   Patient is here for routine follow-up. Since being seen she has done well.  She has reduced her hemoglobin A1c from 8.3-7.3.  She has had no high or low sugars.  She is compliant with medications. She has consistently lost weight.  In September she weighed 347 pounds.  Today she weighs 315.  She is congratulated on this effort.  She has hyperlipidemia.  We discussed that the standard of care for diabetics includes statin medications to manage hyperlipidemia.  She is only 27 years old.  She is going to try to work on the cholesterol in her diet and will check it in 6 months.  I will asked Dr. Fransico HimNida (in endocrinology) if a statin is indicated. When last seen she was suffering from plantar fasciitis.  I showed her exercises, stretching, ice, and started her on Mobic.  She states her feet are much better.  She takes the Mobic as needed.   Patient Active Problem List   Diagnosis Date Noted  . Vitamin D deficiency 10/24/2016  . Hyperlipidemia 10/24/2016  . Diabetes mellitus type 2 in obese (HCC) 09/22/2016  . Morbid obesity (HCC) 09/22/2016  . DUB (dysfunctional uterine bleeding) 09/22/2016  . Tobacco abuse 09/22/2016  . Allergic rhinitis 03/16/2014  . Seasonal allergies 04/16/2013    Outpatient Encounter Medications as of 12/19/2017  Medication Sig  . ACCU-CHEK AVIVA PLUS test strip USE TO CHECK FASTING BLOOD SUGAR ONCE QAM  . ACCU-CHEK SOFTCLIX LANCETS lancets USE AS DIRECTED TO CHECK FASTING BLOOD SUGAR ONCE DAILY IN THE MORNING  . cholecalciferol (VITAMIN D) 1000 units tablet Take 2,000 Units by mouth daily.  Marland Kitchen. loratadine (CLARITIN) 10 MG tablet Take 1 tablet (10 mg total) by mouth daily.  . meloxicam (MOBIC) 15 MG tablet Take 1 tablet (15 mg total) by mouth daily.  . metFORMIN (GLUCOPHAGE) 1000 MG tablet Take 1 tablet (1,000 mg total) by mouth 2 (two) times daily with a meal.  . nystatin (MYCOSTATIN/NYSTOP) powder Apply topically  4 (four) times daily.  Marland Kitchen. nystatin cream (MYCOSTATIN) Apply 1 application topically 2 (two) times daily.  . sitaGLIPtin (JANUVIA) 100 MG tablet Take 1 tablet (100 mg total) by mouth daily.  . [DISCONTINUED] megestrol (MEGACE) 40 MG tablet TAKE 3 TABLETS BY MOUTH DAILY X 5 DAYS, TAKE 2 TABLETS DAILY X 5 DAYS, THEN TAKE 1 TABLET DAILY  . [DISCONTINUED] meloxicam (MOBIC) 15 MG tablet Take 1 tablet (15 mg total) by mouth daily.  . [DISCONTINUED] metFORMIN (GLUCOPHAGE) 1000 MG tablet Take 1 tablet (1,000 mg total) by mouth 2 (two) times daily with a meal.  . [DISCONTINUED] sitaGLIPtin (JANUVIA) 100 MG tablet Take 1 tablet (100 mg total) by mouth daily.   No facility-administered encounter medications on file as of 12/19/2017.     Allergies  Allergen Reactions  . Solu-Medrol [Methylprednisolone Acetate]     Rash, lip swelling     Review of Systems  Constitutional: Negative for chills and fever.  HENT: Negative for congestion and dental problem.   Eyes: Negative for redness and visual disturbance.  Respiratory: Negative for cough and shortness of breath.   Cardiovascular: Negative for chest pain and palpitations.  Gastrointestinal: Negative for nausea and vomiting.  Musculoskeletal: Positive for gait problem. Negative for arthralgias and back pain.       Right foot pain-proving, now intermittent  Skin: Negative for rash and wound.  All other systems reviewed and are negative.   BP 122/84 (  BP Location: Left Arm, Patient Position: Sitting, Cuff Size: Large)   Pulse 68   Temp 98 F (36.7 C) (Temporal)   Resp 18   Ht 5' 3.75" (1.619 m)   Wt (!) 315 lb 0.6 oz (142.9 kg)   SpO2 100%   BMI 54.50 kg/m   Physical Exam  Constitutional: She is oriented to person, place, and time. She appears well-developed and well-nourished.  HENT:  Head: Normocephalic and atraumatic.  Mouth/Throat: Oropharynx is clear and moist.  Cardiovascular: Normal rate and regular rhythm.  Pulmonary/Chest: Effort  normal and breath sounds normal.  Abdominal: Soft. Bowel sounds are normal. There is no tenderness.  Neurological: She is alert and oriented to person, place, and time.  Skin: Skin is warm and dry.  Psychiatric: She has a normal mood and affect. Her behavior is normal.    ASSESSMENT/PLAN:  1. Plantar fasciitis, right Improved with conservative treatment.  2. Diabetes mellitus type 2 in obese (HCC) Hemoglobin A1c down to 7.3.  Patient congratulated  3. Hyperlipidemia, unspecified hyperlipidemia type Patient has a low HDL, high LDL.  Discussed diet and exercise.  4. Morbid obesity (HCC) Improved.  Discussed diet and exercise with a goal of weight loss of 1-2 pounds a week.  She is on track.   Patient Instructions  You are doing well congratulations on the weight loss and improvement in your diabetes See me in six months Call sooner for problems   Eustace Moore, MD

## 2017-12-21 ENCOUNTER — Ambulatory Visit: Payer: Medicaid Other | Admitting: Family Medicine

## 2017-12-25 ENCOUNTER — Ambulatory Visit: Payer: Self-pay | Admitting: "Endocrinology

## 2018-01-02 ENCOUNTER — Encounter: Payer: Self-pay | Admitting: "Endocrinology

## 2018-01-02 ENCOUNTER — Ambulatory Visit (INDEPENDENT_AMBULATORY_CARE_PROVIDER_SITE_OTHER): Payer: Medicaid Other | Admitting: "Endocrinology

## 2018-01-02 VITALS — BP 149/98 | HR 79 | Ht 63.75 in | Wt 312.0 lb

## 2018-01-02 DIAGNOSIS — E782 Mixed hyperlipidemia: Secondary | ICD-10-CM

## 2018-01-02 DIAGNOSIS — E669 Obesity, unspecified: Secondary | ICD-10-CM

## 2018-01-02 DIAGNOSIS — E1169 Type 2 diabetes mellitus with other specified complication: Secondary | ICD-10-CM | POA: Diagnosis not present

## 2018-01-02 DIAGNOSIS — Z720 Tobacco use: Secondary | ICD-10-CM

## 2018-01-02 NOTE — Progress Notes (Signed)
Subjective:    Patient ID: Selena Barr, female    DOB: Apr 03, 1991. Patient is being seen in consultation for management of diabetes requested by  Eustace MooreNelson, Yvonne Sue, MD  Past Medical History:  Diagnosis Date  . Allergy   . Diabetes mellitus without complication (HCC)   . Hyperlipidemia 10/24/2016  . Tobacco abuse 09/22/2016   History reviewed. No pertinent surgical history. Social History   Socioeconomic History  . Marital status: Single    Spouse name: None  . Number of children: 1  . Years of education: 6512  . Highest education level: None  Social Needs  . Financial resource strain: None  . Food insecurity - worry: None  . Food insecurity - inability: None  . Transportation needs - medical: None  . Transportation needs - non-medical: None  Occupational History    Comment: unemployed  Tobacco Use  . Smoking status: Current Every Day Smoker    Packs/day: 0.50    Years: 2.00    Pack years: 1.00    Types: Cigarettes  . Smokeless tobacco: Never Used  Substance and Sexual Activity  . Alcohol use: No  . Drug use: No    Comment: denies use 08/28/14  . Sexual activity: Yes    Birth control/protection: Implant  Other Topics Concern  . None  Social History Narrative   Lives with mother and brother, sister, daughter   Outpatient Encounter Medications as of 01/02/2018  Medication Sig  . ACCU-CHEK AVIVA PLUS test strip USE TO CHECK FASTING BLOOD SUGAR ONCE QAM  . ACCU-CHEK SOFTCLIX LANCETS lancets USE AS DIRECTED TO CHECK FASTING BLOOD SUGAR ONCE DAILY IN THE MORNING  . cholecalciferol (VITAMIN D) 1000 units tablet Take 2,000 Units by mouth daily.  Marland Kitchen. loratadine (CLARITIN) 10 MG tablet Take 1 tablet (10 mg total) by mouth daily.  . meloxicam (MOBIC) 15 MG tablet Take 1 tablet (15 mg total) by mouth daily.  . metFORMIN (GLUCOPHAGE) 1000 MG tablet Take 1 tablet (1,000 mg total) by mouth 2 (two) times daily with a meal.  . nystatin (MYCOSTATIN/NYSTOP) powder Apply  topically 4 (four) times daily.  Marland Kitchen. nystatin cream (MYCOSTATIN) Apply 1 application topically 2 (two) times daily.  . sitaGLIPtin (JANUVIA) 100 MG tablet Take 1 tablet (100 mg total) by mouth daily.   No facility-administered encounter medications on file as of 01/02/2018.    ALLERGIES: Allergies  Allergen Reactions  . Solu-Medrol [Methylprednisolone Acetate]     Rash, lip swelling    VACCINATION STATUS: Immunization History  Administered Date(s) Administered  . HPV Quadrivalent 08/18/2008  . Hepatitis A 11/17/2008  . Hepatitis B 12/14/1997, 08/29/2004, 02/27/2005  . Influenza Whole 09/22/2016  . Influenza,inj,Quad PF,6+ Mos 11/08/2017  . Influenza-Unspecified 12/06/2012  . Meningococcal Conjugate 08/18/2008  . Pneumococcal Conjugate-13 11/08/2017  . Tdap 09/16/2015    Diabetes  She presents for her follow-up diabetic visit. She has type 2 diabetes mellitus. Onset time: She was diagnosed at approximate age of 24 years. Her disease course has been improving. There are no hypoglycemic associated symptoms. Pertinent negatives for hypoglycemia include no confusion, headaches, pallor or seizures. Pertinent negatives for diabetes include no chest pain, no fatigue, no polydipsia, no polyphagia and no polyuria. There are no hypoglycemic complications. Symptoms are improving. There are no diabetic complications. Risk factors for coronary artery disease include diabetes mellitus, obesity, sedentary lifestyle, tobacco exposure, family history and dyslipidemia. Current diabetic treatment includes oral agent (dual therapy). She is compliant with treatment most of the time.  Her weight is decreasing steadily. She is following a generally unhealthy diet. When asked about meal planning, she reported none. She has not had a previous visit with a dietitian. She rarely participates in exercise. An ACE inhibitor/angiotensin II receptor blocker is not being taken. Eye exam is not current.     Review of  Systems  Constitutional: Negative for chills, fatigue, fever and unexpected weight change.  HENT: Negative for trouble swallowing and voice change.   Eyes: Negative for visual disturbance.  Respiratory: Negative for cough, shortness of breath and wheezing.   Cardiovascular: Negative for chest pain, palpitations and leg swelling.  Gastrointestinal: Negative for diarrhea, nausea and vomiting.  Endocrine: Negative for cold intolerance, heat intolerance, polydipsia, polyphagia and polyuria.  Musculoskeletal: Negative for arthralgias and myalgias.  Skin: Negative for color change, pallor, rash and wound.  Neurological: Negative for seizures and headaches.  Psychiatric/Behavioral: Negative for confusion and suicidal ideas.    Objective:    BP (!) 149/98   Pulse 79   Ht 5' 3.75" (1.619 m)   Wt (!) 312 lb (141.5 kg)   BMI 53.98 kg/m   Wt Readings from Last 3 Encounters:  01/02/18 (!) 312 lb (141.5 kg)  12/19/17 (!) 315 lb 0.6 oz (142.9 kg)  11/08/17 (!) 320 lb 1.3 oz (145.2 kg)    Physical Exam  Constitutional: She is oriented to person, place, and time. She appears well-developed.  HENT:  Head: Normocephalic and atraumatic.  Eyes: EOM are normal.  Neck: Normal range of motion. Neck supple. No tracheal deviation present. No thyromegaly present.  Cardiovascular: Normal rate and regular rhythm.  Pulmonary/Chest: Effort normal and breath sounds normal.  Abdominal: Soft. Bowel sounds are normal. There is no tenderness. There is no guarding.  Musculoskeletal: Normal range of motion. She exhibits no edema.  Neurological: She is alert and oriented to person, place, and time. She has normal reflexes. No cranial nerve deficit. Coordination normal.  Skin: Skin is warm and dry. No rash noted. No erythema. No pallor.  Psychiatric: She has a normal mood and affect. Judgment normal.    CMP     Component Value Date/Time   NA 141 12/19/2017 0900   K 3.9 12/19/2017 0900   CL 105 12/19/2017 0900    CO2 29 12/19/2017 0900   GLUCOSE 112 12/19/2017 0900   BUN 9 12/19/2017 0900   CREATININE 0.70 12/19/2017 0900   CALCIUM 9.2 12/19/2017 0900   PROT 6.8 12/19/2017 0900   ALBUMIN 3.6 10/23/2016 1304   AST 14 12/19/2017 0900   ALT 15 12/19/2017 0900   ALKPHOS 80 10/23/2016 1304   BILITOT 0.4 12/19/2017 0900   GFRNONAA 124 11/08/2017 1129   GFRAA 144 11/08/2017 1129    Diabetic Labs (most recent): Lab Results  Component Value Date   HGBA1C 7.3 (H) 11/08/2017   HGBA1C 8.3 (H) 01/24/2017   HGBA1C 8.3 (H) 10/23/2016     Lipid Panel ( most recent) Lipid Panel     Component Value Date/Time   CHOL 181 11/08/2017 1129   TRIG 112 11/08/2017 1129   HDL 28 (L) 11/08/2017 1129   CHOLHDL 6.5 (H) 11/08/2017 1129   VLDL 21 01/24/2017 1055   LDLCALC 115 (H) 01/24/2017 1055      Assessment & Plan:   1. Diabetes mellitus type 2 in obese [facility])  - Patient has currently uncontrolled symptomatic type 2 DM since  27 years of age. -She came with significant improvement in her diabetes care with A1c 7.3% improving  from 8.3%.  Recent labs reviewed, showing normal renal function.   Her diabetes is complicated by obesity/sedentary life, smoking and patient remains at a high risk for more acute and chronic complications of diabetes which include CAD, CVA, CKD, retinopathy, and neuropathy. These are all discussed in detail with the patient.  - I have counseled the patient on diet management and weight loss, by adopting a carbohydrate restricted/protein rich diet.  -  Suggestion is made for her to avoid simple carbohydrates  from her diet including Cakes, Sweet Desserts / Pastries, Ice Cream, Soda (diet and regular), Sweet Tea, Candies, Chips, Cookies, Store Bought Juices, Alcohol in Excess of  1-2 drinks a day, Artificial Sweeteners, and "Sugar-free" Products. This will help patient to have stable blood glucose profile and potentially avoid unintended weight gain.   - I encouraged the patient  to switch to  unprocessed or minimally processed complex starch and increased protein intake (animal or plant source), fruits, and vegetables.  - Patient is advised to stick to a routine mealtimes to eat 3 meals  a day and avoid unnecessary snacks ( to snack only to correct hypoglycemia).  - The patient will be scheduled with Norm Salt, RDN, CDE for individualized DM education.  - I have approached patient with the following individualized plan to manage diabetes and patient agrees:   - I urged her to stay engaged in intensive modification of lifestyle so she can delay the need for insulin treatment.  - I will continue metformin 1000 g by mouth twice a day, and Januvia 100 mg by mouth daily, therapeutically suitable for patient.  - Patient specific target  A1c;  LDL, HDL, Triglycerides, and  Waist Circumference were discussed in detail.  2) BP/HTN: Her blood pressure is not controlled, 149/98.  This is her first notice of elevated blood pressure.  She is advised on salt restriction.  She will be considered for ACE inhibitors next visit. 3) Lipids/HPL:   Uncontrolled, LDL of 115.  She is not age-appropriate for statins.  4)  Weight/Diet: CDE Consult has been initiated , exercise, and detailed carbohydrates information provided.  5) Chronic Care/Health Maintenance:  -Patient is encouraged to continue to follow up with Ophthalmology, Podiatrist at least yearly or according to recommendations, and advised to  Quit smoking. I have recommended yearly flu vaccine and pneumonia vaccination at least every 5 years; moderate intensity exercise for up to 150 minutes weekly; and  sleep for at least 7 hours a day.  - Time spent with the patient: 25 min, of which >50% was spent in reviewing her  current and  previous labs, previous treatments, and medications  doses and developing a plan for long-term care.   - I advised patient to maintain close follow up with Delton See Letta Pate, MD for primary care  needs.  Follow up plan: - Return in about 3 months (around 04/02/2018) for follow up with pre-visit labs.  Marquis Lunch, MD Phone: [phone]  Fax: [phone]  -  This note was partially dictated with voice recognition software. Similar sounding words can be transcribed inadequately or may not  be corrected upon review.  01/02/2018, 1:33 PM

## 2018-01-02 NOTE — Patient Instructions (Signed)

## 2018-01-15 ENCOUNTER — Encounter: Payer: Self-pay | Admitting: Advanced Practice Midwife

## 2018-01-15 ENCOUNTER — Ambulatory Visit: Payer: Medicaid Other | Admitting: Advanced Practice Midwife

## 2018-01-15 VITALS — BP 120/80 | HR 97 | Ht 63.0 in | Wt 312.0 lb

## 2018-01-15 DIAGNOSIS — Z3046 Encounter for surveillance of implantable subdermal contraceptive: Secondary | ICD-10-CM

## 2018-01-15 DIAGNOSIS — Z3049 Encounter for surveillance of other contraceptives: Secondary | ICD-10-CM | POA: Diagnosis not present

## 2018-01-15 MED ORDER — PNV PRENATAL PLUS MULTIVITAMIN 27-1 MG PO TABS: 1.0000 | ORAL_TABLET | Freq: Every day | ORAL | 11 refills | Status: DC

## 2018-01-15 NOTE — Progress Notes (Signed)
HPI:  Selena Barr 27 y.o. here for Nexplanon removal.  Her future plans for birth control are none, she wants to get pregnant.  Past Medical History: Past Medical History:  Diagnosis Date  . Allergy   . Diabetes mellitus without complication (HCC)   . Hyperlipidemia 10/24/2016  . Tobacco abuse 09/22/2016    Past Surgical History: History reviewed. No pertinent surgical history.  Family History: Family History  Problem Relation Age of Onset  . Diabetes Mother   . Alcohol abuse Mother   . COPD Mother   . Drug abuse Mother   . Cancer Maternal Grandmother        throat  . Cancer Maternal Grandfather        throat  . Diabetes Maternal Grandfather     Social History: Social History   Tobacco Use  . Smoking status: Current Every Day Smoker    Packs/day: 0.50    Years: 2.00    Pack years: 1.00    Types: Cigarettes  . Smokeless tobacco: Never Used  Substance Use Topics  . Alcohol use: No  . Drug use: No    Comment: denies use 08/28/14    Allergies:  Allergies  Allergen Reactions  . Solu-Medrol [Methylprednisolone Acetate]     Rash, lip swelling     Meds:  (Not in a hospital admission)    Patient given informed consent for removal of her Nexplanon, time out was performed.  Signed copy in the chart.  Appropriate time out taken. Implanon site identified.  Area prepped in usual sterile fashon. One cc of 1% lidocaine was used to anesthetize the area at the distal end of the implant. A small stab incision was made right beside the implant on the distal portion.  The Nexplanon rod was grasped using hemostats and removed without difficulty.  There was less than 3 cc blood loss. There were no complications.  A small amount of antibiotic ointment and steri-strips were applied over the small incision.  A pressure bandage was applied to reduce any bruising.  The patient tolerated the procedure well and was given post procedure instructions.  PNV sent to pharmacy

## 2018-02-08 ENCOUNTER — Emergency Department (HOSPITAL_COMMUNITY): Payer: Medicaid Other

## 2018-02-08 ENCOUNTER — Emergency Department (HOSPITAL_COMMUNITY)
Admission: EM | Admit: 2018-02-08 | Discharge: 2018-02-08 | Disposition: A | Discharge status: Home / Self Care | Payer: Medicaid Other | Attending: Emergency Medicine | Admitting: Emergency Medicine

## 2018-02-08 ENCOUNTER — Other Ambulatory Visit: Payer: Self-pay

## 2018-02-08 ENCOUNTER — Encounter (HOSPITAL_COMMUNITY): Payer: Self-pay | Admitting: Emergency Medicine

## 2018-02-08 DIAGNOSIS — Z7984 Long term (current) use of oral hypoglycemic drugs: Secondary | ICD-10-CM | POA: Insufficient documentation

## 2018-02-08 DIAGNOSIS — R05 Cough: Secondary | ICD-10-CM | POA: Diagnosis present

## 2018-02-08 DIAGNOSIS — F17210 Nicotine dependence, cigarettes, uncomplicated: Secondary | ICD-10-CM | POA: Diagnosis not present

## 2018-02-08 DIAGNOSIS — E119 Type 2 diabetes mellitus without complications: Secondary | ICD-10-CM | POA: Diagnosis not present

## 2018-02-08 DIAGNOSIS — Z79899 Other long term (current) drug therapy: Secondary | ICD-10-CM | POA: Diagnosis not present

## 2018-02-08 DIAGNOSIS — J209 Acute bronchitis, unspecified: Secondary | ICD-10-CM | POA: Insufficient documentation

## 2018-02-08 LAB — POC URINE PREG, ED: Preg Test, Ur: NEGATIVE

## 2018-02-08 MED ORDER — IPRATROPIUM-ALBUTEROL 0.5-2.5 (3) MG/3ML IN SOLN
3.0000 mL | Freq: Once | RESPIRATORY_TRACT | Status: AC
  Administered 2018-02-08: 3 mL via RESPIRATORY_TRACT
  Filled 2018-02-08: qty 3

## 2018-02-08 MED ORDER — ALBUTEROL (5 MG/ML) CONTINUOUS INHALATION SOLN
10.0000 mg/h | INHALATION_SOLUTION | Freq: Once | RESPIRATORY_TRACT | Status: AC
  Administered 2018-02-08: 10 mg/h via RESPIRATORY_TRACT
  Filled 2018-02-08: qty 20

## 2018-02-08 MED ORDER — ALBUTEROL SULFATE (2.5 MG/3ML) 0.083% IN NEBU
2.5000 mg | INHALATION_SOLUTION | Freq: Once | RESPIRATORY_TRACT | Status: AC
  Administered 2018-02-08: 2.5 mg via RESPIRATORY_TRACT
  Filled 2018-02-08: qty 3

## 2018-02-08 MED ORDER — AZITHROMYCIN 250 MG PO TABS: ORAL_TABLET | ORAL | 0 refills | Status: DC

## 2018-02-08 MED ORDER — ALBUTEROL SULFATE HFA 108 (90 BASE) MCG/ACT IN AERS
2.0000 | INHALATION_SPRAY | Freq: Once | RESPIRATORY_TRACT | Status: AC
  Administered 2018-02-08: 2 via RESPIRATORY_TRACT
  Filled 2018-02-08: qty 6.7

## 2018-02-08 MED ORDER — BENZONATATE 100 MG PO CAPS: 200.0000 mg | ORAL_CAPSULE | Freq: Three times a day (TID) | ORAL | 0 refills | Status: DC | PRN

## 2018-02-08 NOTE — ED Notes (Signed)
Pt ambulated in hallways. O2 sat ranged from 96-100% on RA while ambulating. Pt denied SOB, weakness, dizziness upon walking.

## 2018-02-08 NOTE — ED Triage Notes (Signed)
Prod cough x 2 days with brown sputum. Head congestion noted. Nad. Pain to chest with coughing.

## 2018-02-08 NOTE — ED Provider Notes (Signed)
[facility] EMERGENCY DEPARTMENT Provider Note   CSN: [id number] Arrival date & time: 02/08/18  4098     History   Chief Complaint Chief Complaint  Patient presents with  . Cough    HPI [name] is a 27 y.o. female with a 2-day history of URI type symptoms including nasal congestion with clear rhinorrhea, cough which has been productive of a brown sputum and intermittent wheezing which is worsened at night.  She endorses midsternal sharp burning chest pain with coughing only.  She she reports a mild sore throat, denies ear pain, headache, generalized myalgias, nausea vomiting or diarrhea.  She works at a [employer] home and reports there have been lots of exposures to similar symptoms at her work.  The history is provided by the patient.  Cough  Associated symptoms include rhinorrhea and wheezing. Pertinent negatives include no chest pain, no chills, no ear pain, no sore throat and no shortness of breath.    Past Medical History:  Diagnosis Date  . Allergy   . Diabetes mellitus without complication (HCC)   . Hyperlipidemia 10/24/2016  . Tobacco abuse 09/22/2016    Patient Active Problem List   Diagnosis Date Noted  . Encounter for Nexplanon removal 01/15/2018  . Vitamin D deficiency 10/24/2016  . Mixed hyperlipidemia 10/24/2016  . Diabetes mellitus type 2 in obese (HCC) 09/22/2016  . Morbid obesity (HCC) 09/22/2016  . DUB (dysfunctional uterine bleeding) 09/22/2016  . Tobacco abuse 09/22/2016  . Allergic rhinitis 03/16/2014  . Seasonal allergies 04/16/2013    History reviewed. No pertinent surgical history.  OB History    Gravida Para Term Preterm AB Living   1 1 1     1    SAB TAB Ectopic Multiple Live Births                   Home Medications    Prior to Admission medications   Medication Sig Start Date End Date Taking? Authorizing Provider  Cholecalciferol (VITAMIN D) 2000 units CAPS Take 2,000 Units by mouth daily.   Yes [provider]  meloxicam (MOBIC) 15 MG tablet Take 1 tablet (15 mg total) by mouth daily. 12/19/17  Yes Eustace Moore, MD  metFORMIN (GLUCOPHAGE) 1000 MG tablet Take 1 tablet (1,000 mg total) by mouth 2 (two) times daily with a meal. 12/19/17  Yes Eustace Moore, MD  sitaGLIPtin (JANUVIA) 100 MG tablet Take 1 tablet (100 mg total) by mouth daily. 12/19/17  Yes Eustace Moore, MD  azithromycin (ZITHROMAX Z-PAK) 250 MG tablet Take 2 tablets by mouth on day one followed by one tablet daily for 4 days. 02/08/18   Burgess Amor, PA-C  benzonatate (TESSALON) 100 MG capsule Take 2 capsules (200 mg total) by mouth 3 (three) times daily as needed. 02/08/18   Burgess Amor, PA-C  loratadine (CLARITIN) 10 MG tablet Take 1 tablet (10 mg total) by mouth daily. Patient not taking: Reported on 02/08/2018 04/05/16   Loren Racer, MD  nystatin (MYCOSTATIN/NYSTOP) powder Apply topically 4 (four) times daily. Patient not taking: Reported on 01/15/2018 07/12/17   Eustace Moore, MD  nystatin cream (MYCOSTATIN) Apply 1 application topically 2 (two) times daily. Patient not taking: Reported on 01/15/2018 07/12/17   Eustace Moore, MD  Prenatal Vit-Fe Fumarate-FA (PNV PRENATAL PLUS MULTIVITAMIN) 27-1 MG TABS Take 1 tablet by mouth daily. Patient not taking: Reported on 02/08/2018 01/15/18   Jacklyn Shell, CNM    Family History Family History  Problem  Relation Age of Onset  . Diabetes Mother   . Alcohol abuse Mother   . COPD Mother   . Drug abuse Mother   . Cancer Maternal Grandmother        throat  . Cancer Maternal Grandfather        throat  . Diabetes Maternal Grandfather     Social History Social History   Tobacco Use  . Smoking status: Current Every Day Smoker    Packs/day: 0.50    Years: 2.00    Pack years: 1.00    Types: Cigarettes  . Smokeless tobacco: Never Used  Substance Use Topics  . Alcohol use: No  . Drug use: No    Comment: denies use 08/28/14     Allergies   Solu-medrol  [methylprednisolone acetate]   Review of Systems Review of Systems  Constitutional: Negative for chills and fever.  HENT: Positive for congestion and rhinorrhea. Negative for ear pain, sinus pressure, sore throat, trouble swallowing and voice change.   Eyes: Negative for discharge.  Respiratory: Positive for cough and wheezing. Negative for shortness of breath and stridor.   Cardiovascular: Negative for chest pain.  Gastrointestinal: Negative for abdominal pain.  Genitourinary: Negative.      Physical Exam Updated Vital Signs BP 139/79   Pulse 77   Temp 98.2 F (36.8 C) (Oral)   Resp 19   SpO2 98%   Physical Exam  Constitutional: She is oriented to person, place, and time. She appears well-developed and well-nourished.  HENT:  Head: Normocephalic and atraumatic.  Right Ear: Tympanic membrane and ear canal normal.  Left Ear: Tympanic membrane and ear canal normal.  Nose: Mucosal edema and rhinorrhea present.  Mouth/Throat: Uvula is midline, oropharynx is clear and moist and mucous membranes are normal. No oropharyngeal exudate, posterior oropharyngeal edema, posterior oropharyngeal erythema or tonsillar abscesses.  Eyes: Conjunctivae are normal.  Cardiovascular: Normal rate and normal heart sounds.  Pulmonary/Chest: Effort normal. No accessory muscle usage. No respiratory distress. She has wheezes. She has no rales.  Expiratory wheeze throughout all lung fields, prolonged expirations.    Abdominal: Soft. There is no tenderness.  Musculoskeletal: Normal range of motion.  Neurological: She is alert and oriented to person, place, and time.  Skin: Skin is warm and dry. No rash noted.  Psychiatric: She has a normal mood and affect.     ED Treatments / Results  Labs (all labs ordered are listed, but only abnormal results are displayed) Labs Reviewed  POC URINE PREG, ED    EKG  EKG Interpretation None       Radiology Dg Chest 2 View  Result Date:  02/08/2018 CLINICAL DATA:  Productive cough, congestion, and shortness of breath for 2 days, history diabetes mellitus, smoker EXAM: CHEST - 2 VIEW COMPARISON:  08/11/2017 FINDINGS: Normal heart size, mediastinal contours, and pulmonary vascularity. Lungs clear. No pleural effusion or pneumothorax. Bones unremarkable. IMPRESSION: Normal exam. Electronically Signed   By: Ulyses SouthwardMark  Boles M.D.   On: 02/08/2018 10:32    Procedures Procedures (including critical care time)  Medications Ordered in ED Medications  albuterol (PROVENTIL HFA;VENTOLIN HFA) 108 (90 Base) MCG/ACT inhaler 2 puff (not administered)  ipratropium-albuterol (DUONEB) 0.5-2.5 (3) MG/3ML nebulizer solution 3 mL (3 mLs Nebulization Given 02/08/18 1012)  albuterol (PROVENTIL) (2.5 MG/3ML) 0.083% nebulizer solution 2.5 mg (2.5 mg Nebulization Given 02/08/18 1012)  albuterol (PROVENTIL,VENTOLIN) solution continuous neb (10 mg/hr Nebulization Given 02/08/18 1217)     Initial Impression / Assessment and Plan / ED  Course  I have reviewed the triage vital signs and the nursing notes.  Pertinent labs & imaging results that were available during my care of the patient were reviewed by me and considered in my medical decision making (see chart for details).     Patient was given an albuterol and Atrovent nebulizer treatment without significant improvement in her wheezing.  She was then given an hour-long albuterol nebulizer with significant improvement.  She ambulated in the department without increased work of breathing, hypoxia.  She was treated for acute bronchitis with Zithromax and an albuterol MDI.  Also prescribe Tessalon for cough suppression.  She does not tolerate prednisone reporting mouth swelling and rash the last time she was given this medication.  Advised close follow-up with her PCP or returning here for any persistent or worsening symptoms.  Final Clinical Impressions(s) / ED Diagnoses   Final diagnoses:  Acute bronchitis,  unspecified organism    ED Discharge Orders        Ordered    benzonatate (TESSALON) 100 MG capsule  3 times daily PRN     02/08/18 1407    azithromycin (ZITHROMAX Z-PAK) 250 MG tablet     02/08/18 1409       Burgess Amor, PA-C 02/08/18 1412    Raeford Razor, MD 02/08/18 1540

## 2018-02-08 NOTE — Discharge Instructions (Signed)
Take the entire course of the antibiotics prescribed.  Take 2 puffs with your albuterol inhaler every 4 hours if you are wheezing.  Tessalon may help you with the coughing.  Rest to make sure you are drinking plenty fluids.  Get rechecked by your doctor if not improving or for any worsening symptoms including increased weakness, shortness of breath or any new symptoms.

## 2018-02-11 ENCOUNTER — Encounter: Payer: Self-pay | Admitting: Family Medicine

## 2018-02-18 ENCOUNTER — Other Ambulatory Visit: Payer: Self-pay | Admitting: Obstetrics & Gynecology

## 2018-03-15 ENCOUNTER — Ambulatory Visit: Payer: Medicaid Other | Admitting: Family Medicine

## 2018-03-15 ENCOUNTER — Encounter: Payer: Self-pay | Admitting: Family Medicine

## 2018-03-15 VITALS — BP 118/84 | HR 98 | Temp 98.7°F | Resp 16 | Ht 63.0 in | Wt 309.0 lb

## 2018-03-15 DIAGNOSIS — H66002 Acute suppurative otitis media without spontaneous rupture of ear drum, left ear: Secondary | ICD-10-CM | POA: Diagnosis not present

## 2018-03-15 DIAGNOSIS — J029 Acute pharyngitis, unspecified: Secondary | ICD-10-CM

## 2018-03-15 MED ORDER — AMOXICILLIN 500 MG PO CAPS: 500.0000 mg | ORAL_CAPSULE | Freq: Three times a day (TID) | ORAL | 0 refills | Status: DC

## 2018-03-15 NOTE — Progress Notes (Signed)
Chief Complaint  Patient presents with  . Sore Throat    x 2 days, drainage down throat   . Ear Pain    left sided ear pain    Sore throat Runny stuffy nose L ear pain, severe, cant hear out of that ear No fever or chills No headache or abdominal pain No NVD  Patient Active Problem List   Diagnosis Date Noted  . Encounter for Nexplanon removal 01/15/2018  . Vitamin D deficiency 10/24/2016  . Mixed hyperlipidemia 10/24/2016  . Diabetes mellitus type 2 in obese (HCC) 09/22/2016  . Morbid obesity (HCC) 09/22/2016  . DUB (dysfunctional uterine bleeding) 09/22/2016  . Tobacco abuse 09/22/2016  . Allergic rhinitis 03/16/2014  . Seasonal allergies 04/16/2013    Outpatient Encounter Medications as of 03/15/2018  Medication Sig  . Cholecalciferol (VITAMIN D) 2000 units CAPS Take 2,000 Units by mouth daily.  . megestrol (MEGACE) 40 MG tablet TAKE 3 TABLETS BY MOUTH DAILY X 5 DAYS, TAKE 2 TABLETS DAILY X 5 DAYS, THEN TAKE 1 TABLET DAILY  . meloxicam (MOBIC) 15 MG tablet Take 1 tablet (15 mg total) by mouth daily.  . metFORMIN (GLUCOPHAGE) 1000 MG tablet Take 1 tablet (1,000 mg total) by mouth 2 (two) times daily with a meal.  . Prenatal Vit-Fe Fumarate-FA (PNV PRENATAL PLUS MULTIVITAMIN) 27-1 MG TABS Take 1 tablet by mouth daily.  . sitaGLIPtin (JANUVIA) 100 MG tablet Take 1 tablet (100 mg total) by mouth daily.  Marland Kitchen amoxicillin (AMOXIL) 500 MG capsule Take 1 capsule (500 mg total) by mouth 3 (three) times daily.   No facility-administered encounter medications on file as of 03/15/2018.     Allergies  Allergen Reactions  . Solu-Medrol [Methylprednisolone Acetate]     Rash, lip swelling     Review of Systems  Constitutional: Negative for activity change, appetite change, chills, fatigue, fever and unexpected weight change.  HENT: Positive for congestion, ear discharge, ear pain, postnasal drip, rhinorrhea and sore throat. Negative for dental problem.   Eyes: Negative for  redness and visual disturbance.  Respiratory: Negative for cough and shortness of breath.   Cardiovascular: Negative for chest pain, palpitations and leg swelling.  Gastrointestinal: Negative for abdominal pain, constipation and diarrhea.  Genitourinary: Negative for difficulty urinating, frequency and menstrual problem.  Musculoskeletal: Negative for arthralgias and back pain.  Neurological: Negative for dizziness and headaches.  Psychiatric/Behavioral: Negative for dysphoric mood and sleep disturbance. The patient is not nervous/anxious.     Physical Exam  Constitutional: She is oriented to person, place, and time. She appears well-developed and well-nourished. No distress.  Obese.  NAD  HENT:  Head: Normocephalic and atraumatic.  Right Ear: Tympanic membrane and external ear normal.  Left Ear: External ear normal.  Nose: Nose normal.  Mouth/Throat: Mucous membranes are normal. Posterior oropharyngeal erythema present. No oropharyngeal exudate. Tonsils are 2+ on the right. Tonsils are 2+ on the left. No tonsillar exudate.  tonsils enlarged and red. Left TM injected, dull  Eyes: Pupils are equal, round, and reactive to light. Conjunctivae are normal.  Neck: Normal range of motion.  Cardiovascular: Normal rate, regular rhythm and normal heart sounds.  Pulmonary/Chest: Effort normal and breath sounds normal.  Abdominal: Soft. Bowel sounds are normal. She exhibits no distension. There is no tenderness.  Lymphadenopathy:    She has cervical adenopathy.  Neurological: She is oriented to person, place, and time.  Skin: Skin is warm and dry.  Psychiatric: She has a normal mood and affect.  Her behavior is normal.    BP 118/84   Pulse 98   Temp 98.7 F (37.1 C) (Oral)   Resp 16   Ht 5\' 3"  (1.6 m)   Wt (!) 309 lb (140.2 kg)   SpO2 95%   BMI 54.74 kg/m   @PHYSEXAM @  ASSESSMENT/PLAN:  1. Acute sore throat Likely a virus  2. Non-recurrent acute suppurative otitis media of left  ear without spontaneous rupture of tympanic membrane Discussed symptomatic treatment to avoid antibiotics- she is worried about worsening over the weekend   Patient Instructions  Rest Work note is given, off until Monday You may use over-the-counter cough and cold medicines You may try Flonase or Nasonex over-the-counter Take the amoxicillin 3 times a day for 5 days Call if not better by Monday   Eustace MooreYvonne Sue Zowie Lundahl, MD

## 2018-03-15 NOTE — Patient Instructions (Signed)
Rest Work note is given, off until Monday You may use over-the-counter cough and cold medicines You may try Flonase or Nasonex over-the-counter Take the amoxicillin 3 times a day for 5 days Call if not better by Monday

## 2018-04-03 ENCOUNTER — Ambulatory Visit: Payer: Medicaid Other | Admitting: "Endocrinology

## 2018-05-09 ENCOUNTER — Encounter: Payer: Self-pay | Admitting: Adult Health

## 2018-05-09 ENCOUNTER — Ambulatory Visit: Payer: Medicaid Other | Admitting: Adult Health

## 2018-05-09 ENCOUNTER — Encounter (INDEPENDENT_AMBULATORY_CARE_PROVIDER_SITE_OTHER): Payer: Self-pay

## 2018-05-09 VITALS — BP 120/79 | HR 94 | Ht 64.0 in | Wt 318.0 lb

## 2018-05-09 DIAGNOSIS — N926 Irregular menstruation, unspecified: Secondary | ICD-10-CM

## 2018-05-09 DIAGNOSIS — E1169 Type 2 diabetes mellitus with other specified complication: Secondary | ICD-10-CM

## 2018-05-09 DIAGNOSIS — E669 Obesity, unspecified: Secondary | ICD-10-CM | POA: Diagnosis not present

## 2018-05-09 DIAGNOSIS — Z3A01 Less than 8 weeks gestation of pregnancy: Secondary | ICD-10-CM | POA: Insufficient documentation

## 2018-05-09 DIAGNOSIS — O3680X0 Pregnancy with inconclusive fetal viability, not applicable or unspecified: Secondary | ICD-10-CM | POA: Insufficient documentation

## 2018-05-09 DIAGNOSIS — Z3201 Encounter for pregnancy test, result positive: Secondary | ICD-10-CM | POA: Diagnosis not present

## 2018-05-09 LAB — POCT URINE PREGNANCY: Preg Test, Ur: POSITIVE — AB

## 2018-05-09 MED ORDER — PNV PRENATAL PLUS MULTIVITAMIN 27-1 MG PO TABS: 1.0000 | ORAL_TABLET | Freq: Every day | ORAL | 11 refills | Status: DC

## 2018-05-09 MED ORDER — METFORMIN HCL 1000 MG PO TABS: 1000.0000 mg | ORAL_TABLET | Freq: Two times a day (BID) | ORAL | 3 refills | Status: DC

## 2018-05-09 MED ORDER — BLOOD GLUCOSE MONITOR KIT: PACK | 99 refills | Status: DC

## 2018-05-09 NOTE — Patient Instructions (Signed)
First Trimester of Pregnancy The first trimester of pregnancy is from week 1 until the end of week 13 (months 1 through 3). A week after a sperm fertilizes an egg, the egg will implant on the wall of the uterus. This embryo will begin to develop into a baby. Genes from you and your partner will form the baby. The female genes will determine whether the baby will be a boy or a girl. At 6-8 weeks, the eyes and face will be formed, and the heartbeat can be seen on ultrasound. At the end of 12 weeks, all the baby's organs will be formed. Now that you are pregnant, you will want to do everything you can to have a healthy baby. Two of the most important things are to get good prenatal care and to follow your health care provider's instructions. Prenatal care is all the medical care you receive before the baby's birth. This care will help prevent, find, and treat any problems during the pregnancy and childbirth. Body changes during your first trimester Your body goes through many changes during pregnancy. The changes vary from woman to woman.  You may gain or lose a couple of pounds at first.  You may feel sick to your stomach (nauseous) and you may throw up (vomit). If the vomiting is uncontrollable, call your health care provider.  You may tire easily.  You may develop headaches that can be relieved by medicines. All medicines should be approved by your health care provider.  You may urinate more often. Painful urination may mean you have a bladder infection.  You may develop heartburn as a result of your pregnancy.  You may develop constipation because certain hormones are causing the muscles that push stool through your intestines to slow down.  You may develop hemorrhoids or swollen veins (varicose veins).  Your breasts may begin to grow larger and become tender. Your nipples may stick out more, and the tissue that surrounds them (areola) may become darker.  Your gums may bleed and may be  sensitive to brushing and flossing.  Dark spots or blotches (chloasma, mask of pregnancy) may develop on your face. This will likely fade after the baby is born.  Your menstrual periods will stop.  You may have a loss of appetite.  You may develop cravings for certain kinds of food.  You may have changes in your emotions from day to day, such as being excited to be pregnant or being concerned that something may go wrong with the pregnancy and baby.  You may have more vivid and strange dreams.  You may have changes in your hair. These can include thickening of your hair, rapid growth, and changes in texture. Some women also have hair loss during or after pregnancy, or hair that feels dry or thin. Your hair will most likely return to normal after your baby is born.  What to expect at prenatal visits During a routine prenatal visit:  You will be weighed to make sure you and the baby are growing normally.  Your blood pressure will be taken.  Your abdomen will be measured to track your baby's growth.  The fetal heartbeat will be listened to between weeks 10 and 14 of your pregnancy.  Test results from any previous visits will be discussed.  Your health care provider may ask you:  How you are feeling.  If you are feeling the baby move.  If you have had any abnormal symptoms, such as leaking fluid, bleeding, severe headaches,   or abdominal cramping.  If you are using any tobacco products, including cigarettes, chewing tobacco, and electronic cigarettes.  If you have any questions.  Other tests that may be performed during your first trimester include:  Blood tests to find your blood type and to check for the presence of any previous infections. The tests will also be used to check for low iron levels (anemia) and protein on red blood cells (Rh antibodies). Depending on your risk factors, or if you previously had diabetes during pregnancy, you may have tests to check for high blood  sugar that affects pregnant women (gestational diabetes).  Urine tests to check for infections, diabetes, or protein in the urine.  An ultrasound to confirm the proper growth and development of the baby.  Fetal screens for spinal cord problems (spina bifida) and Down syndrome.  HIV (human immunodeficiency virus) testing. Routine prenatal testing includes screening for HIV, unless you choose not to have this test.  You may need other tests to make sure you and the baby are doing well.  Follow these instructions at home: Medicines  Follow your health care provider's instructions regarding medicine use. Specific medicines may be either safe or unsafe to take during pregnancy.  Take a prenatal vitamin that contains at least 600 micrograms (mcg) of folic acid.  If you develop constipation, try taking a stool softener if your health care provider approves. Eating and drinking  Eat a balanced diet that includes fresh fruits and vegetables, whole grains, good sources of protein such as meat, eggs, or tofu, and low-fat dairy. Your health care provider will help you determine the amount of weight gain that is right for you.  Avoid raw meat and uncooked cheese. These carry germs that can cause birth defects in the baby.  Eating four or five small meals rather than three large meals a day may help relieve nausea and vomiting. If you start to feel nauseous, eating a few soda crackers can be helpful. Drinking liquids between meals, instead of during meals, also seems to help ease nausea and vomiting.  Limit foods that are high in fat and processed sugars, such as fried and sweet foods.  To prevent constipation: ? Eat foods that are high in fiber, such as fresh fruits and vegetables, whole grains, and beans. ? Drink enough fluid to keep your urine clear or pale yellow. Activity  Exercise only as directed by your health care provider. Most women can continue their usual exercise routine during  pregnancy. Try to exercise for 30 minutes at least 5 days a week. Exercising will help you: ? Control your weight. ? Stay in shape. ? Be prepared for labor and delivery.  Experiencing pain or cramping in the lower abdomen or lower back is a good sign that you should stop exercising. Check with your health care provider before continuing with normal exercises.  Try to avoid standing for long periods of time. Move your legs often if you must stand in one place for a long time.  Avoid heavy lifting.  Wear low-heeled shoes and practice good posture.  You may continue to have sex unless your health care provider tells you not to. Relieving pain and discomfort  Wear a good support bra to relieve breast tenderness.  Take warm sitz baths to soothe any pain or discomfort caused by hemorrhoids. Use hemorrhoid cream if your health care provider approves.  Rest with your legs elevated if you have leg cramps or low back pain.  If you develop   varicose veins in your legs, wear support hose. Elevate your feet for 15 minutes, 3-4 times a day. Limit salt in your diet. Prenatal care  Schedule your prenatal visits by the twelfth week of pregnancy. They are usually scheduled monthly at first, then more often in the last 2 months before delivery.  Write down your questions. Take them to your prenatal visits.  Keep all your prenatal visits as told by your health care provider. This is important. Safety  Wear your seat belt at all times when driving.  Make a list of emergency phone numbers, including numbers for family, friends, the hospital, and police and fire departments. General instructions  Ask your health care provider for a referral to a local prenatal education class. Begin classes no later than the beginning of month 6 of your pregnancy.  Ask for help if you have counseling or nutritional needs during pregnancy. Your health care provider can offer advice or refer you to specialists for help  with various needs.  Do not use hot tubs, steam rooms, or saunas.  Do not douche or use tampons or scented sanitary pads.  Do not cross your legs for long periods of time.  Avoid cat litter boxes and soil used by cats. These carry germs that can cause birth defects in the baby and possibly loss of the fetus by miscarriage or stillbirth.  Avoid all smoking, herbs, alcohol, and medicines not prescribed by your health care provider. Chemicals in these products affect the formation and growth of the baby.  Do not use any products that contain nicotine or tobacco, such as cigarettes and e-cigarettes. If you need help quitting, ask your health care provider. You may receive counseling support and other resources to help you quit.  Schedule a dentist appointment. At home, brush your teeth with a soft toothbrush and be gentle when you floss. Contact a health care provider if:  You have dizziness.  You have mild pelvic cramps, pelvic pressure, or nagging pain in the abdominal area.  You have persistent nausea, vomiting, or diarrhea.  You have a bad smelling vaginal discharge.  You have pain when you urinate.  You notice increased swelling in your face, hands, legs, or ankles.  You are exposed to fifth disease or chickenpox.  You are exposed to German measles (rubella) and have never had it. Get help right away if:  You have a fever.  You are leaking fluid from your vagina.  You have spotting or bleeding from your vagina.  You have severe abdominal cramping or pain.  You have rapid weight gain or loss.  You vomit blood or material that looks like coffee grounds.  You develop a severe headache.  You have shortness of breath.  You have any kind of trauma, such as from a fall or a car accident. Summary  The first trimester of pregnancy is from week 1 until the end of week 13 (months 1 through 3).  Your body goes through many changes during pregnancy. The changes vary from  woman to woman.  You will have routine prenatal visits. During those visits, your health care provider will examine you, discuss any test results you may have, and talk with you about how you are feeling. This information is not intended to replace advice given to you by your health care provider. Make sure you discuss any questions you have with your health care provider. Document Released: 11/14/2001 Document Revised: 11/01/2016 Document Reviewed: 11/01/2016 Elsevier Interactive Patient Education  2018 [employer]   Inc[employer]  

## 2018-05-09 NOTE — Progress Notes (Signed)
  Subjective:     Patient ID: Selena Barr, female   DOB: [birth year], 27 y.o.   MRN: [id number]  HPI Lavern is a 27 year old black female in for UPT,she had nexplanon removed 01/15/18 and has stopped diabetes meds.Last  A1c 7.3 in December, was seeing Dr Dorris Fetch.   Review of Systems +missed period +HPT Reviewed past medical,surgical, social and family history. Reviewed medications and allergies.     Objective:   Physical Exam BP 120/79 (BP Location: Left Arm, Patient Position: Sitting, Cuff Size: Large)   Pulse 94   Ht '5\' 4"'$  (1.626 m)   Wt (!) 318 lb (144.2 kg)   LMP 04/06/2018   BMI 54.58 kg/m UPT +, about 4+5 weeks by LMP with EDD 01/11/19.Skin warm and dry. Neck: mid line trachea, normal thyroid, good ROM, no lymphadenopathy noted. Lungs: clear to ausculation bilaterally. Cardiovascular: regular rate and rhythm.Abdomen is soft and non tender and obese.     Assessment:     1. Pregnancy test positive   2. Less than [redacted] weeks gestation of pregnancy   3. Encounter to determine fetal viability of pregnancy, single or unspecified fetus   4. Diabetes mellitus type 2 in obese [facility])       Plan:     Meds ordered this encounter  Medications  . Prenatal Vit-Fe Fumarate-FA (PNV PRENATAL PLUS MULTIVITAMIN) 27-1 MG TABS    Sig: Take 1 tablet by mouth daily.    Dispense:  30 tablet    Refill:  11    Order Specific Question:   Supervising Provider    Answer:   Elonda Husky, LUTHER H [[id number]]  . metFORMIN (GLUCOPHAGE) 1000 MG tablet    Sig: Take 1 tablet (1,000 mg total) by mouth 2 (two) times daily with a meal.    Dispense:  180 tablet    Refill:  3    Order Specific Question:   Supervising Provider    Answer:   Elonda Husky, LUTHER H [[id number]]  . blood glucose meter kit and supplies KIT    Sig: Dispense based on patient and insurance preference. Use up to four times daily as directed. (FOR ICD-9 250.00, 250.01).    Dispense:  1 each    Refill:  prn    Order Specific Question:   Supervising Provider   Answer:   Florian Buff [[id number]]    Order Specific Question:   Number of strips    Answer:   69    Order Specific Question:   Number of lancets    Answer:   90  Check blood sugar tid and record Return in 3 weeks for dating [place] Review handout on First trimester and by [facility]

## 2018-05-30 ENCOUNTER — Ambulatory Visit (INDEPENDENT_AMBULATORY_CARE_PROVIDER_SITE_OTHER): Payer: Medicaid Other

## 2018-05-30 DIAGNOSIS — O3680X0 Pregnancy with inconclusive fetal viability, not applicable or unspecified: Secondary | ICD-10-CM

## 2018-05-30 DIAGNOSIS — Z3A01 Less than 8 weeks gestation of pregnancy: Secondary | ICD-10-CM | POA: Diagnosis not present

## 2018-05-30 NOTE — Progress Notes (Signed)
US 7+5 wks,single IUP w YS,positive FHT 139 bpm,crl 13.30 mm,normal ovaries bilat

## 2018-06-03 ENCOUNTER — Telehealth: Payer: Self-pay | Admitting: Adult Health

## 2018-06-03 NOTE — Telephone Encounter (Signed)
leftt message to take metformin and record sugars, look pretty good, still a few elevated.

## 2018-06-14 ENCOUNTER — Encounter: Payer: Medicaid Other | Admitting: Women's Health

## 2018-06-14 ENCOUNTER — Ambulatory Visit: Payer: Medicaid Other | Admitting: *Deleted

## 2018-06-18 ENCOUNTER — Ambulatory Visit: Payer: Medicaid Other | Admitting: Family Medicine

## 2018-07-03 ENCOUNTER — Encounter: Payer: Medicaid Other | Admitting: Advanced Practice Midwife

## 2018-07-03 ENCOUNTER — Ambulatory Visit: Payer: Medicaid Other | Admitting: *Deleted

## 2018-07-23 ENCOUNTER — Encounter: Payer: Self-pay | Admitting: Women's Health

## 2018-07-23 ENCOUNTER — Ambulatory Visit (INDEPENDENT_AMBULATORY_CARE_PROVIDER_SITE_OTHER): Payer: Medicaid Other | Admitting: Women's Health

## 2018-07-23 ENCOUNTER — Ambulatory Visit: Payer: Medicaid Other | Admitting: *Deleted

## 2018-07-23 VITALS — BP 138/90 | HR 88 | Wt 315.0 lb

## 2018-07-23 DIAGNOSIS — Z331 Pregnant state, incidental: Secondary | ICD-10-CM

## 2018-07-23 DIAGNOSIS — O0992 Supervision of high risk pregnancy, unspecified, second trimester: Secondary | ICD-10-CM | POA: Diagnosis not present

## 2018-07-23 DIAGNOSIS — F17200 Nicotine dependence, unspecified, uncomplicated: Secondary | ICD-10-CM

## 2018-07-23 DIAGNOSIS — E119 Type 2 diabetes mellitus without complications: Secondary | ICD-10-CM

## 2018-07-23 DIAGNOSIS — Z1379 Encounter for other screening for genetic and chromosomal anomalies: Secondary | ICD-10-CM

## 2018-07-23 DIAGNOSIS — E669 Obesity, unspecified: Secondary | ICD-10-CM

## 2018-07-23 DIAGNOSIS — Z3A15 15 weeks gestation of pregnancy: Secondary | ICD-10-CM | POA: Diagnosis not present

## 2018-07-23 DIAGNOSIS — E1169 Type 2 diabetes mellitus with other specified complication: Secondary | ICD-10-CM

## 2018-07-23 DIAGNOSIS — O0990 Supervision of high risk pregnancy, unspecified, unspecified trimester: Secondary | ICD-10-CM | POA: Diagnosis not present

## 2018-07-23 DIAGNOSIS — Z363 Encounter for antenatal screening for malformations: Secondary | ICD-10-CM

## 2018-07-23 DIAGNOSIS — Z1389 Encounter for screening for other disorder: Secondary | ICD-10-CM

## 2018-07-23 DIAGNOSIS — O24112 Pre-existing diabetes mellitus, type 2, in pregnancy, second trimester: Secondary | ICD-10-CM

## 2018-07-23 LAB — POCT URINALYSIS DIPSTICK OB
Glucose, UA: NEGATIVE — AB
Ketones, UA: NEGATIVE
Nitrite, UA: NEGATIVE
POC,PROTEIN,UA: NEGATIVE
RBC UA: NEGATIVE

## 2018-07-23 NOTE — Patient Instructions (Addendum)
Selena Barr, I greatly value your feedback.  If you receive a survey following your visit with [place] today, we appreciate you taking the time to fill it out.  Thanks, [name], CNM, WHNP-BC  Check blood sugars 4 times a day: in the morning before eating/drinking anything (<95) and 2 hours after eating breakfast, lunch, and supper (<120).    Begin taking a 81mg  baby aspirin daily to decrease risk of preeclampsia during pregnancy     Second Trimester of Pregnancy The second trimester is from week 14 through week 27 (months 4 through 6). The second trimester is often a time when you feel your best. Your body has adjusted to being pregnant, and you begin to feel better physically. Usually, morning sickness has lessened or quit completely, you may have more energy, and you may have an increase in appetite. The second trimester is also a time when the fetus is growing rapidly. At the end of the sixth month, the fetus is about 9 inches long and weighs about 1 pounds. You will likely begin to feel the baby move (quickening) between 16 and 20 weeks of pregnancy. Body changes during your second trimester Your body continues to go through many changes during your second trimester. The changes vary from woman to woman.  Your weight will continue to increase. You will notice your lower abdomen bulging out.  You may begin to get stretch marks on your hips, abdomen, and breasts.  You may develop headaches that can be relieved by medicines. The medicines should be approved by your health care provider.  You may urinate more often because the fetus is pressing on your bladder.  You may develop or continue to have heartburn as a result of your pregnancy.  You may develop constipation because certain hormones are causing the muscles that push waste through your intestines to slow down.  You may develop hemorrhoids or swollen, bulging veins (varicose veins).  You may have back pain. This is caused  by: ? Weight gain. ? Pregnancy hormones that are relaxing the joints in your pelvis. ? A shift in weight and the muscles that support your balance.  Your breasts will continue to grow and they will continue to become tender.  Your gums may bleed and may be sensitive to brushing and flossing.  Dark spots or blotches (chloasma, mask of pregnancy) may develop on your face. This will likely fade after the baby is born.  A dark line from your belly button to the pubic area (linea nigra) may appear. This will likely fade after the baby is born.  You may have changes in your hair. These can include thickening of your hair, rapid growth, and changes in texture. Some women also have hair loss during or after pregnancy, or hair that feels dry or thin. Your hair will most likely return to normal after your baby is born.  What to expect at prenatal visits During a routine prenatal visit:  You will be weighed to make sure you and the fetus are growing normally.  Your blood pressure will be taken.  Your abdomen will be measured to track your baby's growth.  The fetal heartbeat will be listened to.  Any test results from the previous visit will be discussed.  Your health care provider may ask you:  How you are feeling.  If you are feeling the baby move.  If you have had any abnormal symptoms, such as leaking fluid, bleeding, severe headaches, or abdominal cramping.  If  you are using any tobacco products, including cigarettes, chewing tobacco, and electronic cigarettes.  If you have any questions.  Other tests that may be performed during your second trimester include:  Blood tests that check for: ? Low iron levels (anemia). ? High blood sugar that affects pregnant women (gestational diabetes) between 5324 and 28 weeks. ? Rh antibodies. This is to check for a protein on red blood cells (Rh factor).  Urine tests to check for infections, diabetes, or protein in the urine.  An ultrasound  to confirm the proper growth and development of the baby.  An amniocentesis to check for possible genetic problems.  Fetal screens for spina bifida and Down syndrome.  HIV (human immunodeficiency virus) testing. Routine prenatal testing includes screening for HIV, unless you choose not to have this test.  Follow these instructions at home: Medicines  Follow your health care provider's instructions regarding medicine use. Specific medicines may be either safe or unsafe to take during pregnancy.  Take a prenatal vitamin that contains at least 600 micrograms (mcg) of folic acid.  If you develop constipation, try taking a stool softener if your health care provider approves. Eating and drinking  Eat a balanced diet that includes fresh fruits and vegetables, whole grains, good sources of protein such as meat, eggs, or tofu, and low-fat dairy. Your health care provider will help you determine the amount of weight gain that is right for you.  Avoid raw meat and uncooked cheese. These carry germs that can cause birth defects in the baby.  If you have low calcium intake from food, talk to your health care provider about whether you should take a daily calcium supplement.  Limit foods that are high in fat and processed sugars, such as fried and sweet foods.  To prevent constipation: ? Drink enough fluid to keep your urine clear or pale yellow. ? Eat foods that are high in fiber, such as fresh fruits and vegetables, whole grains, and beans. Activity  Exercise only as directed by your health care provider. Most women can continue their usual exercise routine during pregnancy. Try to exercise for 30 minutes at least 5 days a week. Stop exercising if you experience uterine contractions.  Avoid heavy lifting, wear low heel shoes, and practice good posture.  A sexual relationship may be continued unless your health care provider directs you otherwise. Relieving pain and discomfort  Wear a good  support bra to prevent discomfort from breast tenderness.  Take warm sitz baths to soothe any pain or discomfort caused by hemorrhoids. Use hemorrhoid cream if your health care provider approves.  Rest with your legs elevated if you have leg cramps or low back pain.  If you develop varicose veins, wear support hose. Elevate your feet for 15 minutes, 3-4 times a day. Limit salt in your diet. Prenatal Care  Write down your questions. Take them to your prenatal visits.  Keep all your prenatal visits as told by your health care provider. This is important. Safety  Wear your seat belt at all times when driving.  Make a list of emergency phone numbers, including numbers for family, friends, the hospital, and police and fire departments. General instructions  Ask your health care provider for a referral to a local prenatal education class. Begin classes no later than the beginning of month 6 of your pregnancy.  Ask for help if you have counseling or nutritional needs during pregnancy. Your health care provider can offer advice or refer you to specialists  for help with various needs.  Do not use hot tubs, steam rooms, or saunas.  Do not douche or use tampons or scented sanitary pads.  Do not cross your legs for long periods of time.  Avoid cat litter boxes and soil used by cats. These carry germs that can cause birth defects in the baby and possibly loss of the fetus by miscarriage or stillbirth.  Avoid all smoking, herbs, alcohol, and unprescribed drugs. Chemicals in these products can affect the formation and growth of the baby.  Do not use any products that contain nicotine or tobacco, such as cigarettes and e-cigarettes. If you need help quitting, ask your health care provider.  Visit your dentist if you have not gone yet during your pregnancy. Use a soft toothbrush to brush your teeth and be gentle when you floss. Contact a health care provider if:  You have dizziness.  You have  mild pelvic cramps, pelvic pressure, or nagging pain in the abdominal area.  You have persistent nausea, vomiting, or diarrhea.  You have a bad smelling vaginal discharge.  You have pain when you urinate. Get help right away if:  You have a fever.  You are leaking fluid from your vagina.  You have spotting or bleeding from your vagina.  You have severe abdominal cramping or pain.  You have rapid weight gain or weight loss.  You have shortness of breath with chest pain.  You notice sudden or extreme swelling of your face, hands, ankles, feet, or legs.  You have not felt your baby move in over an hour.  You have severe headaches that do not go away when you take medicine.  You have vision changes. Summary  The second trimester is from week 14 through week 27 (months 4 through 6). It is also a time when the fetus is growing rapidly.  Your body goes through many changes during pregnancy. The changes vary from woman to woman.  Avoid all smoking, herbs, alcohol, and unprescribed drugs. These chemicals affect the formation and growth your baby.  Do not use any tobacco products, such as cigarettes, chewing tobacco, and e-cigarettes. If you need help quitting, ask your health care provider.  Contact your health care provider if you have any questions. Keep all prenatal visits as told by your health care provider. This is important. This information is not intended to replace advice given to you by your health care provider. Make sure you discuss any questions you have with your health care provider. Document Released: 11/14/2001 Document Revised: 04/27/2016 Document Reviewed: 01/21/2013 Elsevier Interactive Patient Education  2017 [employer]
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
## 2018-07-24 ENCOUNTER — Encounter: Payer: Self-pay | Admitting: Women's Health

## 2018-07-24 DIAGNOSIS — Z2839 Other underimmunization status: Secondary | ICD-10-CM | POA: Insufficient documentation

## 2018-07-24 DIAGNOSIS — Z283 Underimmunization status: Secondary | ICD-10-CM | POA: Insufficient documentation

## 2018-07-24 DIAGNOSIS — O9989 Other specified diseases and conditions complicating pregnancy, childbirth and the puerperium: Secondary | ICD-10-CM

## 2018-07-24 DIAGNOSIS — O09899 Supervision of other high risk pregnancies, unspecified trimester: Secondary | ICD-10-CM | POA: Insufficient documentation

## 2018-07-24 LAB — PMP SCREEN PROFILE (10S), URINE
Amphetamine Scrn, Ur: NEGATIVE ng/mL
BARBITURATE SCREEN URINE: NEGATIVE ng/mL
BENZODIAZEPINE SCREEN, URINE: NEGATIVE ng/mL
CANNABINOIDS UR QL SCN: NEGATIVE ng/mL
CREATININE(CRT), U: 196.1 mg/dL (ref 20.0–300.0)
Cocaine (Metab) Scrn, Ur: NEGATIVE ng/mL
METHADONE SCREEN, URINE: NEGATIVE ng/mL
OXYCODONE+OXYMORPHONE UR QL SCN: NEGATIVE ng/mL
Opiate Scrn, Ur: NEGATIVE ng/mL
Ph of Urine: 7.1 (ref 4.5–8.9)
Phencyclidine Qn, Ur: NEGATIVE ng/mL
Propoxyphene Scrn, Ur: NEGATIVE ng/mL

## 2018-07-24 LAB — GC/CHLAMYDIA PROBE AMP
CHLAMYDIA, DNA PROBE: NEGATIVE
NEISSERIA GONORRHOEAE BY PCR: NEGATIVE

## 2018-07-24 LAB — HEMOGLOBIN A1C
Est. average glucose Bld gHb Est-mCnc: 123 mg/dL
HEMOGLOBIN A1C: 5.9 % — AB (ref 4.8–5.6)

## 2018-07-25 LAB — AFP TETRA
DIA MOM VALUE: 0.77
DIA VALUE (EIA): 95.98 pg/mL
DSR (BY AGE) 1 IN: 948
DSR (Second Trimester) 1 IN: 3218
GESTATIONAL AGE AFP: 15.3 wk
MATERNAL AGE AT EDD: 26.6 a
MSAFP Mom: 1.26
MSAFP: 26.8 ng/mL
MSHCG Mom: 0.78
MSHCG: 27116 m[IU]/mL
OSB RISK: 10000
T18 (By Age): 1:3695 {titer}
TEST RESULTS AFP: NEGATIVE
WEIGHT: 315 [lb_av]
uE3 Mom: 0.61
uE3 Value: 0.37 ng/mL

## 2018-07-25 LAB — MICROSCOPIC EXAMINATION
Bacteria, UA: NONE SEEN
Casts: NONE SEEN /lpf
RBC MICROSCOPIC, UA: NONE SEEN /HPF (ref 0–2)

## 2018-07-25 LAB — OBSTETRIC PANEL, INCLUDING HIV
Antibody Screen: NEGATIVE
BASOS ABS: 0 10*3/uL (ref 0.0–0.2)
Basos: 0 %
EOS (ABSOLUTE): 0.2 10*3/uL (ref 0.0–0.4)
EOS: 2 %
HEMOGLOBIN: 12.7 g/dL (ref 11.1–15.9)
HEP B S AG: NEGATIVE
HIV Screen 4th Generation wRfx: NONREACTIVE
Hematocrit: 37.4 % (ref 34.0–46.6)
IMMATURE GRANS (ABS): 0 10*3/uL (ref 0.0–0.1)
IMMATURE GRANULOCYTES: 0 %
Lymphocytes Absolute: 2.4 10*3/uL (ref 0.7–3.1)
Lymphs: 19 %
MCH: 28.9 pg (ref 26.6–33.0)
MCHC: 34 g/dL (ref 31.5–35.7)
MCV: 85 fL (ref 79–97)
MONOCYTES: 4 %
Monocytes Absolute: 0.5 10*3/uL (ref 0.1–0.9)
NEUTROS PCT: 75 %
Neutrophils Absolute: 9.2 10*3/uL — ABNORMAL HIGH (ref 1.4–7.0)
Platelets: 464 10*3/uL — ABNORMAL HIGH (ref 150–450)
RBC: 4.4 x10E6/uL (ref 3.77–5.28)
RDW: 14 % (ref 12.3–15.4)
RH TYPE: POSITIVE
RPR: NONREACTIVE
WBC: 12.3 10*3/uL — ABNORMAL HIGH (ref 3.4–10.8)

## 2018-07-25 LAB — URINALYSIS, ROUTINE W REFLEX MICROSCOPIC
BILIRUBIN UA: NEGATIVE
Glucose, UA: NEGATIVE
NITRITE UA: NEGATIVE
PH UA: 7 (ref 5.0–7.5)
Protein, UA: NEGATIVE
RBC UA: NEGATIVE
Specific Gravity, UA: 1.023 (ref 1.005–1.030)
UUROB: 1 mg/dL (ref 0.2–1.0)

## 2018-07-25 LAB — URINE CULTURE

## 2018-07-25 LAB — SICKLE CELL SCREEN: SICKLE CELL SCREEN: NEGATIVE

## 2018-08-04 DIAGNOSIS — 419620001 Death: Secondary | SNOMED CT

## 2018-08-04 DEATH — deceased

## 2018-08-14 ENCOUNTER — Encounter (HOSPITAL_COMMUNITY): Payer: Self-pay | Admitting: *Deleted

## 2018-08-14 ENCOUNTER — Inpatient Hospital Stay (HOSPITAL_COMMUNITY)
Admission: AD | Admit: 2018-08-14 | Discharge: 2018-08-14 | Disposition: A | Discharge status: Home / Self Care | Payer: Medicaid Other | Source: Ambulatory Visit | Attending: Obstetrics & Gynecology | Admitting: Obstetrics & Gynecology

## 2018-08-14 ENCOUNTER — Telehealth: Payer: Self-pay | Admitting: *Deleted

## 2018-08-14 DIAGNOSIS — R109 Unspecified abdominal pain: Secondary | ICD-10-CM | POA: Insufficient documentation

## 2018-08-14 DIAGNOSIS — N760 Acute vaginitis: Secondary | ICD-10-CM

## 2018-08-14 DIAGNOSIS — B9689 Other specified bacterial agents as the cause of diseases classified elsewhere: Secondary | ICD-10-CM | POA: Diagnosis not present

## 2018-08-14 DIAGNOSIS — O23592 Infection of other part of genital tract in pregnancy, second trimester: Secondary | ICD-10-CM | POA: Diagnosis not present

## 2018-08-14 DIAGNOSIS — N949 Unspecified condition associated with female genital organs and menstrual cycle: Secondary | ICD-10-CM

## 2018-08-14 DIAGNOSIS — O26892 Other specified pregnancy related conditions, second trimester: Secondary | ICD-10-CM | POA: Diagnosis not present

## 2018-08-14 DIAGNOSIS — Z3A18 18 weeks gestation of pregnancy: Secondary | ICD-10-CM | POA: Diagnosis not present

## 2018-08-14 LAB — URINALYSIS, ROUTINE W REFLEX MICROSCOPIC
Glucose, UA: NEGATIVE mg/dL
Ketones, ur: 40 mg/dL — AB
Nitrite: NEGATIVE
Protein, ur: NEGATIVE mg/dL
Specific Gravity, Urine: 1.02 (ref 1.005–1.030)
pH: 6.5 (ref 5.0–8.0)

## 2018-08-14 LAB — WET PREP, GENITAL
Sperm: NONE SEEN
Trich, Wet Prep: NONE SEEN
Yeast Wet Prep HPF POC: NONE SEEN

## 2018-08-14 LAB — URINALYSIS, MICROSCOPIC (REFLEX)

## 2018-08-14 MED ORDER — METRONIDAZOLE 500 MG PO TABS: 500.0000 mg | ORAL_TABLET | Freq: Two times a day (BID) | ORAL | 0 refills | Status: DC

## 2018-08-14 MED ORDER — COMFORT FIT MATERNITY SUPP LG MISC: 1.0000 | Freq: Every day | 0 refills | Status: DC

## 2018-08-14 NOTE — Discharge Instructions (Signed)

## 2018-08-14 NOTE — Telephone Encounter (Signed)
LMOVM returning call 

## 2018-08-14 NOTE — MAU Note (Signed)
Pt reports some vaginal spotting when she uses the bathroom. Pt also reports frequent urination. Both symptoms started last night. Pt is sure that it is vaginal spotting. Pt reports some lower abdominal pain-rates 5/10. Took Tylenol around 7pm and reports pain has improved.

## 2018-08-14 NOTE — MAU Provider Note (Addendum)
History     CSN: [id number]  Arrival date and time: 08/14/18 2008   First Provider Initiated Contact with Patient 08/14/18 2215      Chief Complaint  Patient presents with  . Abdominal Pain  . Vaginal Bleeding   Selena Barr is a 27 y.o. G3P1 at 12w4dwho presents to MAU with complaints of vaginal bleeding and abdominal pain. She reports vaginal bleeding started last night when she went to the restroom she noticed spotting when she wiped. She describes the bleeding as bright red, has been wearing a panty liner but denies liner being soaked or clots present. Last IC was Sunday. She reports abdominal pain has been occurring for over a week, describes pain as intermittent sharp pain that gets worse when she is at work. 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 Rates pain 5/10- has taken 1,0048mof Tylenol prior to arrival to MAU and reports pain is gradually getting better. [facility]    OB History    Gravida  3   Para  2   Term  2   Preterm      AB      Living  1     SAB      TAB      Ectopic      Multiple      Live Births  2           Past Medical History:  Diagnosis Date  . Allergy   . [place]  . Hyperlipidemia 10/24/2016  . Tobacco abuse 09/22/2016    Past Surgical History:  Procedure Laterality Date  . WISDOM TOOTH EXTRACTION      Family History  Problem Relation Age of Onset  . Diabetes Mother   . Alcohol abuse Mother   . COPD Mother   . Drug abuse Mother   . Cancer Maternal Grandmother        throat  . Cancer Maternal Grandfather        throat  . Diabetes Maternal Grandfather     Social History   Tobacco Use  . Smoking status: Current Every Day Smoker    Packs/day: 0.25    Years: 2.00    Pack years: 0.50    Types: Cigarettes  . Smokeless tobacco: Never Used  . Tobacco  comment: "5 per day"  Substance Use Topics  . Alcohol use: No  . Drug use: No    Comment: denies use 08/28/14    Allergies:  Allergies  Allergen Reactions  . Solu-Medrol [Methylprednisolone Acetate]     Rash, lip swelling     Medications Prior to Admission  Medication Sig Dispense Refill Last Dose  . ACCU-CHEK AVIVA PLUS test strip USE UP TO QID TO TEST BLOOD SUGAR AS DIRECTED  0 08/14/2018 at Unknown time  . ACCU-CHEK SOFTCLIX LANCETS lancets USE UP TO QID TO TEST BLOOD SUGAR  AS DIRECTED  0 08/14/2018 at Unknown time  . blood glucose meter kit and supplies KIT Dispense based on patient and insurance preference. Use up to four times daily as directed. (FOR ICD-9 250.00, 250.01). 1 each prn 08/14/2018 at Unknown time  . Cholecalciferol (VITAMIN D) 2000 units CAPS Take 2,000 Units by mouth daily.   Past Month at Unknown time  . metFORMIN (GLUCOPHAGE) 1000 MG tablet Take 1 tablet (1,000 mg total) by mouth  2 (two) times daily with a meal. 180 tablet 3 08/14/2018 at Unknown time  . Prenatal Vit-Fe Fumarate-FA (PNV PRENATAL PLUS MULTIVITAMIN) 27-1 MG TABS Take 1 tablet by mouth daily. 30 tablet 11 08/14/2018 at Unknown time    Review of Systems  Constitutional: Negative.   Respiratory: Negative.   Cardiovascular: Negative.   Gastrointestinal: Positive for abdominal pain. Negative for constipation, diarrhea, nausea and vomiting.  Genitourinary: Positive for vaginal bleeding. Negative for difficulty urinating, dysuria, frequency, pelvic pain, urgency and vaginal pain.  Musculoskeletal: Negative.   Neurological: Negative.    Physical Exam   Blood pressure 116/77, pulse 96, temperature 98.4 F (36.9 C), temperature source Oral, resp. rate 18, height 5' 2"  (1.575 m), weight (!) 142.4 kg, last menstrual period 04/06/2018, SpO2 100 %.  Physical Exam  Nursing note and vitals reviewed. Constitutional: She is oriented to person, place, and time. She appears well-developed. No distress.  HENT:   Head: Normocephalic.  Cardiovascular: Normal rate, regular rhythm and normal heart sounds.  Respiratory: Effort normal and breath sounds normal. No respiratory distress. She has no wheezes.  GI: Soft. Bowel sounds are normal. There is no tenderness. There is no rebound and no guarding.  Genitourinary: Cervix exhibits no motion tenderness and no friability. No tenderness or bleeding in the vagina. Vaginal discharge found.  Genitourinary Comments: Pelvic exam: cervix pink, visually closed, yellow milky discharge present with odor, no vaginal bleeding present.   Musculoskeletal: Normal range of motion. She exhibits no edema.  Neurological: She is alert and oriented to person, place, and time.  Skin: Skin is warm and dry.  Psychiatric: She has a normal mood and affect. Her behavior is normal. Thought content normal.   Dilation: Closed Effacement (%): Thick Cervical Position: Posterior Exam by:: V Roslind Michaux CNM  Pt informed that the ultrasound is considered a limited OB ultrasound and is not intended to be a complete ultrasound exam.  Patient also informed that the ultrasound is not being completed with the intent of assessing for fetal or placental anomalies or any pelvic abnormalities.  Explained that the purpose of today's ultrasound is to assess for  location of placenta and heart rate.  Patient acknowledges the purpose of the exam and the limitations of the study.    FHR 160 by Doppler   MAU Course  Procedures  MDM Wet prep  GC/C  Bedside US   Lab results reviewed: Results for orders placed or performed during the hospital encounter of 08/14/18 (from the past 24 hour(s))  Urinalysis, Routine w reflex microscopic     Status: Abnormal   Collection Time: 08/14/18  9:47 PM  Result Value Ref Range   Color, Urine YELLOW YELLOW   APPearance CLEAR CLEAR   Specific Gravity, Urine 1.020 1.005 - 1.030   pH 6.5 5.0 - 8.0   Glucose, UA NEGATIVE NEGATIVE mg/dL   Hgb urine dipstick SMALL (A)  NEGATIVE   Bilirubin Urine SMALL (A) NEGATIVE   Ketones, ur 40 (A) NEGATIVE mg/dL   Protein, ur NEGATIVE NEGATIVE mg/dL   Nitrite NEGATIVE NEGATIVE   Leukocytes, UA SMALL (A) NEGATIVE  Urinalysis, Microscopic (reflex)     Status: Abnormal   Collection Time: 08/14/18  9:47 PM  Result Value Ref Range   RBC / HPF 0-5 0 - 5 RBC/hpf   WBC, UA 0-5 0 - 5 WBC/hpf   Bacteria, UA FEW (A) NONE SEEN   Squamous Epithelial / LPF 0-5 0 - 5  Wet prep, genital  Status: Abnormal   Collection Time: 08/14/18 10:34 PM  Result Value Ref Range   Yeast Wet Prep HPF POC NONE SEEN NONE SEEN   Trich, Wet Prep NONE SEEN NONE SEEN   Clue Cells Wet Prep HPF POC PRESENT (A) NONE SEEN   WBC, Wet Prep HPF POC MANY (A) NONE SEEN   Sperm NONE SEEN    Wet prep- positive for clue cells, will treat for BV based on clinical symptoms  GC/C- pending  UA- negative  Urine culture- pending   Discussed results of labs with patient. Patient reports pain is relieved after medication. Educated and discussed round ligament pain with use of maternity support belt. Rx for support belt given to patient. Educated on BV during pregnancy and vaginal bleeding, discussed reasons to return to MAU. Rx for Flagyl sent to pharmacy of choice. Pt stable at time of discharge.   Assessment and Plan   1. BV (bacterial vaginosis)   2. [redacted] weeks gestation of pregnancy   3. Round ligament pain    Discharge home  Follow up as scheduled for prenatal appointments  Return to MAU as needed  Rx for maternity support belt and Flagyl  Urine culture pending- will call patient with positive results and treat appropriately   Follow-up Information    [facility]. Go on 08/19/2018.   Specialty:  Obstetrics and Gynecology Why:  Follow up as scheduled for prenatal appointments  Contact information: [place] Follow up.   Why:  Pick up maternity  support belt at this location Contact information: [place] [address] [phone]         Allergies as of 08/14/2018      Reactions   Solu-medrol [methylprednisolone Acetate]    Rash, lip swelling      Medication List    TAKE these medications   ACCU-CHEK AVIVA PLUS test strip Generic drug:  glucose blood USE UP TO QID TO TEST BLOOD SUGAR AS DIRECTED   ACCU-CHEK SOFTCLIX LANCETS lancets USE UP TO QID TO TEST BLOOD SUGAR  AS DIRECTED   blood glucose meter kit and supplies Kit Dispense based on patient and insurance preference. Use up to four times daily as directed. (FOR ICD-9 250.00, 250.01).   COMFORT FIT MATERNITY SUPP LG Misc 1 Device by Does not apply route daily.   metFORMIN 1000 MG tablet Commonly known as:  GLUCOPHAGE Take 1 tablet (1,000 mg total) by mouth 2 (two) times daily with a meal.   metroNIDAZOLE 500 MG tablet Commonly known as:  FLAGYL Take 1 tablet (500 mg total) by mouth 2 (two) times daily.   PNV PRENATAL PLUS MULTIVITAMIN 27-1 MG Tabs Take 1 tablet by mouth daily.   Vitamin D 2000 units Caps Take 2,000 Units by mouth daily.      Lajean Manes CNM 08/14/2018, 11:43 PM

## 2018-08-16 LAB — GC/CHLAMYDIA PROBE AMP (~~LOC~~) NOT AT ARMC
Chlamydia: NEGATIVE
Neisseria Gonorrhea: NEGATIVE

## 2018-08-16 LAB — CULTURE, OB URINE

## 2018-08-19 ENCOUNTER — Encounter: Payer: Self-pay | Admitting: *Deleted

## 2018-08-19 ENCOUNTER — Ambulatory Visit (INDEPENDENT_AMBULATORY_CARE_PROVIDER_SITE_OTHER): Payer: Medicaid Other | Admitting: Women's Health

## 2018-08-19 ENCOUNTER — Encounter: Payer: Self-pay | Admitting: Women's Health

## 2018-08-19 ENCOUNTER — Ambulatory Visit (INDEPENDENT_AMBULATORY_CARE_PROVIDER_SITE_OTHER): Payer: Medicaid Other

## 2018-08-19 ENCOUNTER — Other Ambulatory Visit: Payer: Self-pay

## 2018-08-19 ENCOUNTER — Other Ambulatory Visit (HOSPITAL_COMMUNITY)
Admission: RE | Admit: 2018-08-19 | Discharge: 2018-08-19 | Disposition: A | Discharge status: Home / Self Care | Payer: Medicaid Other | Source: Ambulatory Visit | Attending: Obstetrics & Gynecology | Admitting: Obstetrics & Gynecology

## 2018-08-19 VITALS — BP 120/72 | HR 103 | Wt 310.0 lb

## 2018-08-19 DIAGNOSIS — O0990 Supervision of high risk pregnancy, unspecified, unspecified trimester: Secondary | ICD-10-CM

## 2018-08-19 DIAGNOSIS — O24112 Pre-existing diabetes mellitus, type 2, in pregnancy, second trimester: Secondary | ICD-10-CM | POA: Insufficient documentation

## 2018-08-19 DIAGNOSIS — Z3A19 19 weeks gestation of pregnancy: Secondary | ICD-10-CM | POA: Diagnosis not present

## 2018-08-19 DIAGNOSIS — Z23 Encounter for immunization: Secondary | ICD-10-CM | POA: Diagnosis not present

## 2018-08-19 DIAGNOSIS — O0992 Supervision of high risk pregnancy, unspecified, second trimester: Secondary | ICD-10-CM | POA: Diagnosis not present

## 2018-08-19 DIAGNOSIS — Z7984 Long term (current) use of oral hypoglycemic drugs: Secondary | ICD-10-CM | POA: Diagnosis not present

## 2018-08-19 DIAGNOSIS — Z363 Encounter for antenatal screening for malformations: Secondary | ICD-10-CM

## 2018-08-19 DIAGNOSIS — Z331 Pregnant state, incidental: Secondary | ICD-10-CM

## 2018-08-19 DIAGNOSIS — Z3482 Encounter for supervision of other normal pregnancy, second trimester: Secondary | ICD-10-CM

## 2018-08-19 DIAGNOSIS — Z124 Encounter for screening for malignant neoplasm of cervix: Secondary | ICD-10-CM

## 2018-08-19 DIAGNOSIS — Z1389 Encounter for screening for other disorder: Secondary | ICD-10-CM

## 2018-08-19 LAB — POCT URINALYSIS DIPSTICK OB
Glucose, UA: NEGATIVE
Nitrite, UA: NEGATIVE
RBC UA: NEGATIVE

## 2018-08-19 NOTE — Progress Notes (Signed)
US 19+2 wks,cephalic,cx 4 cm,posterior pl gr 0,normal ovaries bilat,svp of fluid 4.7 cm,fhr 154 bpm,efw 278 g 38%,anatomy complete,no obvious abnormalities

## 2018-08-19 NOTE — Patient Instructions (Addendum)
Selena Barr, I greatly value your feedback.  If you receive a survey following your visit with us today, we appreciate you taking the time to fill it out.  Thanks, Joellyn HaffKim Khristian Seals, CNM, WHNP-BC  Please download the BabyScripts app and start charting your blood sugars in it  Begin taking a 81mg  baby aspirin daily to decrease risk of preeclampsia during pregnancy    Second Trimester of Pregnancy The second trimester is from week 14 through week 27 (months 4 through 6). The second trimester is often a time when you feel your best. Your body has adjusted to being pregnant, and you begin to feel better physically. Usually, morning sickness has lessened or quit completely, you may have more energy, and you may have an increase in appetite. The second trimester is also a time when the fetus is growing rapidly. At the end of the sixth month, the fetus is about 9 inches long and weighs about 1 pounds. You will likely begin to feel the baby move (quickening) between 16 and 20 weeks of pregnancy. Body changes during your second trimester Your body continues to go through many changes during your second trimester. The changes vary from woman to woman.  Your weight will continue to increase. You will notice your lower abdomen bulging out.  You may begin to get stretch marks on your hips, abdomen, and breasts.  You may develop headaches that can be relieved by medicines. The medicines should be approved by your health care provider.  You may urinate more often because the fetus is pressing on your bladder.  You may develop or continue to have heartburn as a result of your pregnancy.  You may develop constipation because certain hormones are causing the muscles that push waste through your intestines to slow down.  You may develop hemorrhoids or swollen, bulging veins (varicose veins).  You may have back pain. This is caused by: ? Weight gain. ? Pregnancy hormones that are relaxing the joints in your  pelvis. ? A shift in weight and the muscles that support your balance.  Your breasts will continue to grow and they will continue to become tender.  Your gums may bleed and may be sensitive to brushing and flossing.  Dark spots or blotches (chloasma, mask of pregnancy) may develop on your face. This will likely fade after the baby is born.  A dark line from your belly button to the pubic area (linea nigra) may appear. This will likely fade after the baby is born.  You may have changes in your hair. These can include thickening of your hair, rapid growth, and changes in texture. Some women also have hair loss during or after pregnancy, or hair that feels dry or thin. Your hair will most likely return to normal after your baby is born.  What to expect at prenatal visits During a routine prenatal visit:  You will be weighed to make sure you and the fetus are growing normally.  Your blood pressure will be taken.  Your abdomen will be measured to track your baby's growth.  The fetal heartbeat will be listened to.  Any test results from the previous visit will be discussed.  Your health care provider may ask you:  How you are feeling.  If you are feeling the baby move.  If you have had any abnormal symptoms, such as leaking fluid, bleeding, severe headaches, or abdominal cramping.  If you are using any tobacco products, including cigarettes, chewing tobacco, and electronic cigarettes.  If you have any questions.  Other tests that may be performed during your second trimester include:  Blood tests that check for: ? Low iron levels (anemia). ? High blood sugar that affects pregnant women (gestational diabetes) between 30 and 28 weeks. ? Rh antibodies. This is to check for a protein on red blood cells (Rh factor).  Urine tests to check for infections, diabetes, or protein in the urine.  An ultrasound to confirm the proper growth and development of the baby.  An amniocentesis  to check for possible genetic problems.  Fetal screens for spina bifida and Down syndrome.  HIV (human immunodeficiency virus) testing. Routine prenatal testing includes screening for HIV, unless you choose not to have this test.  Follow these instructions at home: Medicines  Follow your health care provider's instructions regarding medicine use. Specific medicines may be either safe or unsafe to take during pregnancy.  Take a prenatal vitamin that contains at least 600 micrograms (mcg) of folic acid.  If you develop constipation, try taking a stool softener if your health care provider approves. Eating and drinking  Eat a balanced diet that includes fresh fruits and vegetables, whole grains, good sources of protein such as meat, eggs, or tofu, and low-fat dairy. Your health care provider will help you determine the amount of weight gain that is right for you.  Avoid raw meat and uncooked cheese. These carry germs that can cause birth defects in the baby.  If you have low calcium intake from food, talk to your health care provider about whether you should take a daily calcium supplement.  Limit foods that are high in fat and processed sugars, such as fried and sweet foods.  To prevent constipation: ? Drink enough fluid to keep your urine clear or pale yellow. ? Eat foods that are high in fiber, such as fresh fruits and vegetables, whole grains, and beans. Activity  Exercise only as directed by your health care provider. Most women can continue their usual exercise routine during pregnancy. Try to exercise for 30 minutes at least 5 days a week. Stop exercising if you experience uterine contractions.  Avoid heavy lifting, wear low heel shoes, and practice good posture.  A sexual relationship may be continued unless your health care provider directs you otherwise. Relieving pain and discomfort  Wear a good support bra to prevent discomfort from breast tenderness.  Take warm sitz  baths to soothe any pain or discomfort caused by hemorrhoids. Use hemorrhoid cream if your health care provider approves.  Rest with your legs elevated if you have leg cramps or low back pain.  If you develop varicose veins, wear support hose. Elevate your feet for 15 minutes, 3-4 times a day. Limit salt in your diet. Prenatal Care  Write down your questions. Take them to your prenatal visits.  Keep all your prenatal visits as told by your health care provider. This is important. Safety  Wear your seat belt at all times when driving.  Make a list of emergency phone numbers, including numbers for family, friends, the hospital, and police and fire departments. General instructions  Ask your health care provider for a referral to a local prenatal education class. Begin classes no later than the beginning of month 6 of your pregnancy.  Ask for help if you have counseling or nutritional needs during pregnancy. Your health care provider can offer advice or refer you to specialists for help with various needs.  Do not use hot tubs, steam rooms, or  saunas.  Do not douche or use tampons or scented sanitary pads.  Do not cross your legs for long periods of time.  Avoid cat litter boxes and soil used by cats. These carry germs that can cause birth defects in the baby and possibly loss of the fetus by miscarriage or stillbirth.  Avoid all smoking, herbs, alcohol, and unprescribed drugs. Chemicals in these products can affect the formation and growth of the baby.  Do not use any products that contain nicotine or tobacco, such as cigarettes and e-cigarettes. If you need help quitting, ask your health care provider.  Visit your dentist if you have not gone yet during your pregnancy. Use a soft toothbrush to brush your teeth and be gentle when you floss. Contact a health care provider if:  You have dizziness.  You have mild pelvic cramps, pelvic pressure, or nagging pain in the abdominal  area.  You have persistent nausea, vomiting, or diarrhea.  You have a bad smelling vaginal discharge.  You have pain when you urinate. Get help right away if:  You have a fever.  You are leaking fluid from your vagina.  You have spotting or bleeding from your vagina.  You have severe abdominal cramping or pain.  You have rapid weight gain or weight loss.  You have shortness of breath with chest pain.  You notice sudden or extreme swelling of your face, hands, ankles, feet, or legs.  You have not felt your baby move in over an hour.  You have severe headaches that do not go away when you take medicine.  You have vision changes. Summary  The second trimester is from week 14 through week 27 (months 4 through 6). It is also a time when the fetus is growing rapidly.  Your body goes through many changes during pregnancy. The changes vary from woman to woman.  Avoid all smoking, herbs, alcohol, and unprescribed drugs. These chemicals affect the formation and growth your baby.  Do not use any tobacco products, such as cigarettes, chewing tobacco, and e-cigarettes. If you need help quitting, ask your health care provider.  Contact your health care provider if you have any questions. Keep all prenatal visits as told by your health care provider. This is important. This information is not intended to replace advice given to you by your health care provider. Make sure you discuss any questions you have with your health care provider. Document Released: 11/14/2001 Document Revised: 04/27/2016 Document Reviewed: 01/21/2013 Elsevier Interactive Patient Education  2017 [employer].

## 2018-08-19 NOTE — Progress Notes (Signed)
HIGH-RISK PREGNANCY VISIT Patient name: Selena Barr MRN [id number]  Date of birth: Jul 17, 1991 Chief Complaint:   High Risk Gestation (u/s today)  History of Present Illness:   Selena Barr is a 27 y.o. G24P2001 female at [redacted]w[redacted]d with an Estimated Date of Delivery: 01/11/19 being seen today for ongoing management of a high-risk pregnancy complicated by Type2DM on metformin 1,000mg  BID.  Today she reports all sugars wnl, daughter deleted BS app so hasn't been uploading them- forgot what it was called so couldn't re-download it. Hasn't started baby ASA yet.   . Vag. Bleeding: None.  Movement: Present. denies leaking of fluid.  Review of Systems:   Pertinent items are noted in HPI Denies abnormal vaginal discharge w/ itching/odor/irritation, headaches, visual changes, shortness of breath, chest pain, abdominal pain, severe nausea/vomiting, or problems with urination or bowel movements unless otherwise stated above. Pertinent History Reviewed:  Reviewed past medical,surgical, social, obstetrical and family history.  Reviewed problem list, medications and allergies. Physical Assessment:   Vitals:   08/19/18 1425  BP: 120/72  Pulse: (!) 103  Weight: (!) 310 lb (140.6 kg)  Body mass index is 56.7 kg/m.           Physical Examination:   General appearance: alert, well appearing, and in no distress  Mental status: alert, oriented to person, place, and time  Skin: warm & dry   Extremities: Edema: None    Cardiovascular: normal heart rate noted  Respiratory: normal respiratory effort, no distress  Abdomen: gravid, soft, non-tender  Pelvic: thin prep pap obtained         Fetal Status: Fetal Heart Rate (bpm): 154 u/s   Movement: Present    Fetal Surveillance Testing today: [place] 19+2 wks,cephalic,cx 4 cm,posterior pl gr 0,normal ovaries bilat,svp of fluid 4.7 cm,fhr 154 bpm,efw 278 g 38%,anatomy complete,no obvious abnormalities  Results for orders placed or performed in visit on 08/19/18  (from the past 24 hour(s))  POC Urinalysis Dipstick OB   Collection Time: 08/19/18  2:26 PM  Result Value Ref Range   Color, UA     Clarity, UA     Glucose, UA Negative Negative   Bilirubin, UA     Ketones, UA small    Spec Grav, UA     Blood, UA neg    pH, UA     POC Protein UA Trace Negative, Trace   Urobilinogen, UA     Nitrite, UA neg    Leukocytes, UA Small (1+) (A) Negative   Appearance     Odor      Assessment & Plan:  1) High-risk pregnancy G3P2001 at [redacted]w[redacted]d with an Estimated Date of Delivery: 01/11/19   2) Type2DM, stable, continue metformin 1,000mg  BID, re-download BS app and start uploading sugars. Begin baby ASA. Fetal echo referral faxed today   Meds: No orders of the defined types were placed in this encounter.  Labs/procedures today: anatomy u/s, flu shot, pap  Treatment Plan:  Growth u/s @  24, 28, 32, 35, 38wks      Fetal echo 24-28wks   2x/wk testing @ 32wks or weekly BPP      Deliver @ 39wks   Reviewed: Preterm labor symptoms and general obstetric precautions including but not limited to vaginal bleeding, contractions, leaking of fluid and fetal movement were reviewed in detail with the patient.  All questions were answered.  Follow-up: Return in about 4 weeks (around 09/16/2018) for HROB, US:EFW.  Orders Placed This Encounter  Procedures  .  US OB Follow Up  . POC Urinalysis Dipstick OB   Cheral MarkerKimberly R Leatha Rohner CNM, [facility]NP-BC 08/19/2018 3:07 PM

## 2018-08-21 LAB — CYTOLOGY - PAP
Adequacy: ABSENT
CHLAMYDIA, DNA PROBE: NEGATIVE
DIAGNOSIS: NEGATIVE
NEISSERIA GONORRHEA: NEGATIVE

## 2018-08-29 DIAGNOSIS — O339 Maternal care for disproportion, unspecified: Secondary | ICD-10-CM | POA: Diagnosis not present

## 2018-09-16 ENCOUNTER — Ambulatory Visit (INDEPENDENT_AMBULATORY_CARE_PROVIDER_SITE_OTHER): Payer: Medicaid Other | Admitting: Obstetrics & Gynecology

## 2018-09-16 ENCOUNTER — Ambulatory Visit (INDEPENDENT_AMBULATORY_CARE_PROVIDER_SITE_OTHER): Payer: Medicaid Other

## 2018-09-16 VITALS — BP 132/87 | HR 92 | Wt 311.0 lb

## 2018-09-16 DIAGNOSIS — Z1389 Encounter for screening for other disorder: Secondary | ICD-10-CM

## 2018-09-16 DIAGNOSIS — O0992 Supervision of high risk pregnancy, unspecified, second trimester: Secondary | ICD-10-CM

## 2018-09-16 DIAGNOSIS — Z331 Pregnant state, incidental: Secondary | ICD-10-CM

## 2018-09-16 DIAGNOSIS — O24112 Pre-existing diabetes mellitus, type 2, in pregnancy, second trimester: Secondary | ICD-10-CM

## 2018-09-16 DIAGNOSIS — Z3A23 23 weeks gestation of pregnancy: Secondary | ICD-10-CM

## 2018-09-16 DIAGNOSIS — O24319 Unspecified pre-existing diabetes mellitus in pregnancy, unspecified trimester: Secondary | ICD-10-CM

## 2018-09-16 DIAGNOSIS — O0990 Supervision of high risk pregnancy, unspecified, unspecified trimester: Secondary | ICD-10-CM

## 2018-09-16 LAB — POCT URINALYSIS DIPSTICK OB
GLUCOSE, UA: NEGATIVE
Ketones, UA: NEGATIVE
Leukocytes, UA: NEGATIVE
Nitrite, UA: NEGATIVE
POC,PROTEIN,UA: NEGATIVE
RBC UA: NEGATIVE

## 2018-09-16 NOTE — Progress Notes (Signed)
   HIGH-RISK PREGNANCY VISIT Patient name: Selena Barr MRN [id number]  Date of birth: 1991-05-29 Chief Complaint:   Routine Prenatal Visit ([place] today)  History of Present Illness:   Selena Barr is a 27 y.o. G51P2001 female at [redacted]w[redacted]d with an Estimated Date of Delivery: 01/11/19 being seen today for ongoing management of a high-risk pregnancy complicated by class  A2 DM.  Today she reports no complaints. Contractions: Not present. Vag. Bleeding: None.  Movement: Present. denies leaking of fluid.  Review of Systems:   Pertinent items are noted in HPI Denies abnormal vaginal discharge w/ itching/odor/irritation, headaches, visual changes, shortness of breath, chest pain, abdominal pain, severe nausea/vomiting, or problems with urination or bowel movements unless otherwise stated above. Pertinent History Reviewed:  Reviewed past medical,surgical, social, obstetrical and family history.  Reviewed problem list, medications and allergies. Physical Assessment:   Vitals:   09/16/18 0905 09/16/18 0909  BP: (!) 144/91 132/87  Pulse: 90 92  Weight: (!) 311 lb (141.1 kg)   Body mass index is 56.88 kg/m.           Physical Examination:   General appearance: alert, well appearing, and in no distress  Mental status: alert, oriented to person, place, and time  Skin: warm & dry   Extremities: Edema: None    Cardiovascular: normal heart rate noted  Respiratory: normal respiratory effort, no distress  Abdomen: gravid, soft, non-tender  Pelvic: Cervical exam deferred         Fetal Status: Fetal Heart Rate (bpm): 146   Movement: Present    Fetal Surveillance Testing today: sonogram   Results for orders placed or performed in visit on 09/16/18 (from the past 24 hour(s))  POC Urinalysis Dipstick OB   Collection Time: 09/16/18  9:07 AM  Result Value Ref Range   Color, UA     Clarity, UA     Glucose, UA Negative Negative   Bilirubin, UA     Ketones, UA neg    Spec Grav, UA     Blood, UA neg     pH, UA     POC Protein UA Negative Negative, Trace   Urobilinogen, UA     Nitrite, UA neg    Leukocytes, UA Negative Negative   Appearance     Odor      Assessment & Plan:  1) High-risk pregnancy G3P2001 at [redacted]w[redacted]d with an Estimated Date of Delivery: 01/11/19   2) Class A2 DM, stable, CBG are decent, pt did not bring journal  3) BP, stable, creepig up a bit, not at a level to treat as of yet  Meds: No orders of the defined types were placed in this encounter.   Labs/procedures today: sonogram  Treatment Plan:  Follow up 2 weeks  Reviewed: Preterm labor symptoms and general obstetric precautions including but not limited to vaginal bleeding, contractions, leaking of fluid and fetal movement were reviewed in detail with the patient.  All questions were answered.  Follow-up: Return in about 2 weeks (around 09/30/2018) for HROB, with Dr Despina Hidden.  Orders Placed This Encounter  Procedures  . POC Urinalysis Dipstick OB   Amaryllis Dyke Sid Greener  09/16/2018 9:42 AM

## 2018-09-16 NOTE — Progress Notes (Signed)
[place] 23+2 wks,breech,cx 3.8 cm,posterior placenta gr 0,normal ovaries bilat,svp of fluid 5.5 cm,fhr 146 bpm,efw 533 g 21%

## 2018-09-30 ENCOUNTER — Other Ambulatory Visit: Payer: Self-pay

## 2018-09-30 ENCOUNTER — Encounter: Payer: Self-pay | Admitting: Family Medicine

## 2018-09-30 ENCOUNTER — Ambulatory Visit (INDEPENDENT_AMBULATORY_CARE_PROVIDER_SITE_OTHER): Payer: Medicaid Other | Admitting: Family Medicine

## 2018-09-30 VITALS — BP 125/76 | HR 96 | Wt 308.0 lb

## 2018-09-30 DIAGNOSIS — Z1389 Encounter for screening for other disorder: Secondary | ICD-10-CM

## 2018-09-30 DIAGNOSIS — Z331 Pregnant state, incidental: Secondary | ICD-10-CM

## 2018-09-30 DIAGNOSIS — Z720 Tobacco use: Secondary | ICD-10-CM

## 2018-09-30 DIAGNOSIS — O0992 Supervision of high risk pregnancy, unspecified, second trimester: Secondary | ICD-10-CM

## 2018-09-30 DIAGNOSIS — O99332 Smoking (tobacco) complicating pregnancy, second trimester: Secondary | ICD-10-CM

## 2018-09-30 DIAGNOSIS — Z3A25 25 weeks gestation of pregnancy: Secondary | ICD-10-CM

## 2018-09-30 DIAGNOSIS — O24112 Pre-existing diabetes mellitus, type 2, in pregnancy, second trimester: Secondary | ICD-10-CM

## 2018-09-30 LAB — POCT URINALYSIS DIPSTICK OB
Blood, UA: NEGATIVE
Glucose, UA: NEGATIVE
Ketones, UA: NEGATIVE
LEUKOCYTES UA: NEGATIVE
NITRITE UA: NEGATIVE
PROTEIN: NEGATIVE

## 2018-09-30 NOTE — Progress Notes (Addendum)
     PRENATAL VISIT NOTE  Subjective:  Selena Barr is a 27 y.o. G3P2001 at [redacted]w[redacted]d being seen today for ongoing prenatal care.  She is currently monitored for the following issues for this high-risk pregnancy and has Type 2 diabetes mellitus during pregnancy; Morbid obesity (HCC); Tobacco abuse; Mixed hyperlipidemia; Supervision of high risk pregnancy, antepartum; Death of infant; and Rubella non-immune status, antepartum on their problem list.  Patient reports backache.  Contractions: Not present. Vag. Bleeding: None.  Movement: Present. Denies leaking of fluid.   The following portions of the patient's history were reviewed and updated as appropriate: allergies, current medications, past family history, past medical history, past social history, past surgical history and problem list. Problem list updated.  Objective:   Vitals:   09/30/18 0939  BP: 125/76  Pulse: 96  Weight: (!) 308 lb (139.7 kg)    Fetal Status:     Movement: Present     General:  Alert, oriented and cooperative. Patient is in no acute distress.  Skin: Skin is warm and dry. No rash noted.   Cardiovascular: Normal heart rate noted  Respiratory: Normal respiratory effort, no problems with respiration noted  Abdomen: Soft, gravid, appropriate for gestational age.  Pain/Pressure: Present     Pelvic: Cervical exam deferred        Extremities: Normal range of motion.  Edema: None  Mental Status: Normal mood and affect. Normal behavior. Normal judgment and thought content.   Assessment and Plan:  Pregnancy: G3P2001 at [redacted]w[redacted]d  1. Supervision of high risk pregnancy in second trimester UTD Reviewed back pain management in pregnancy  2. Pregnancy with type 2 diabetes mellitus in second trimester Reviewed babyscripts BS log-- had two fasting > 95 in the past two weeks and only 1 pp >120 Taking metformin regularly Growth [place] for obesity and T2DM ordered for 4 weeks   3. Tobacco abuse Continues to smoke. Counseled  on gradual reduction  Preterm labor symptoms and general obstetric precautions including but not limited to vaginal bleeding, contractions, leaking of fluid and fetal movement were reviewed in detail with the patient. Please refer to After Visit Summary for other counseling recommendations.  Return in about 4 weeks (around 10/28/2018) for Routine prenatal care.  Future Appointments  Date Time Provider Department Center  10/28/2018 10:15 AM [facility] [place] [facility] None  10/28/2018 10:45 AM Tilda Burrow, MD [facility]    Federico Flake, MD

## 2018-10-15 ENCOUNTER — Telehealth: Payer: Self-pay | Admitting: *Deleted

## 2018-10-15 NOTE — Telephone Encounter (Signed)
I do not remember discussing a cardiology referral with the patient and given her problem list I cannot think of why she might need a referral.

## 2018-10-15 NOTE — Telephone Encounter (Signed)
Pt called stating that she was supposed to be referred to a cardiologist at her last visit. Nothing found in notes. Will send message to Dr. Alvester MorinNewton to see about referral and reason.

## 2018-10-16 NOTE — Telephone Encounter (Signed)
Sent mychart message to patient to inquire if she knows what the referral was for.

## 2018-10-18 ENCOUNTER — Emergency Department (HOSPITAL_COMMUNITY)
Admission: EM | Admit: 2018-10-18 | Discharge: 2018-10-18 | Disposition: A | Discharge status: Home / Self Care | Payer: Medicaid Other | Attending: Emergency Medicine | Admitting: Emergency Medicine

## 2018-10-18 ENCOUNTER — Encounter (HOSPITAL_COMMUNITY): Payer: Self-pay | Admitting: Emergency Medicine

## 2018-10-18 DIAGNOSIS — R519 Headache, unspecified: Secondary | ICD-10-CM

## 2018-10-18 DIAGNOSIS — Z3A28 28 weeks gestation of pregnancy: Secondary | ICD-10-CM | POA: Insufficient documentation

## 2018-10-18 DIAGNOSIS — J3489 Other specified disorders of nose and nasal sinuses: Secondary | ICD-10-CM | POA: Diagnosis not present

## 2018-10-18 DIAGNOSIS — O99333 Smoking (tobacco) complicating pregnancy, third trimester: Secondary | ICD-10-CM | POA: Insufficient documentation

## 2018-10-18 DIAGNOSIS — R51 Headache: Secondary | ICD-10-CM | POA: Insufficient documentation

## 2018-10-18 DIAGNOSIS — F17210 Nicotine dependence, cigarettes, uncomplicated: Secondary | ICD-10-CM | POA: Diagnosis not present

## 2018-10-18 DIAGNOSIS — Z79899 Other long term (current) drug therapy: Secondary | ICD-10-CM | POA: Insufficient documentation

## 2018-10-18 DIAGNOSIS — O24112 Pre-existing diabetes mellitus, type 2, in pregnancy, second trimester: Secondary | ICD-10-CM | POA: Insufficient documentation

## 2018-10-18 DIAGNOSIS — Z7984 Long term (current) use of oral hypoglycemic drugs: Secondary | ICD-10-CM | POA: Diagnosis not present

## 2018-10-18 DIAGNOSIS — J029 Acute pharyngitis, unspecified: Secondary | ICD-10-CM | POA: Diagnosis not present

## 2018-10-18 DIAGNOSIS — O9989 Other specified diseases and conditions complicating pregnancy, childbirth and the puerperium: Secondary | ICD-10-CM | POA: Insufficient documentation

## 2018-10-18 LAB — CBG MONITORING, ED: GLUCOSE-CAPILLARY: 82 mg/dL (ref 70–99)

## 2018-10-18 LAB — URINALYSIS, ROUTINE W REFLEX MICROSCOPIC
Bilirubin Urine: NEGATIVE
GLUCOSE, UA: NEGATIVE mg/dL
HGB URINE DIPSTICK: NEGATIVE
Ketones, ur: NEGATIVE mg/dL
LEUKOCYTES UA: NEGATIVE
Nitrite: NEGATIVE
PROTEIN: NEGATIVE mg/dL
SPECIFIC GRAVITY, URINE: 1.016 (ref 1.005–1.030)
pH: 7 (ref 5.0–8.0)

## 2018-10-18 LAB — BASIC METABOLIC PANEL
ANION GAP: 9 (ref 5–15)
BUN: 5 mg/dL — ABNORMAL LOW (ref 6–20)
CALCIUM: 8.6 mg/dL — AB (ref 8.9–10.3)
CHLORIDE: 106 mmol/L (ref 98–111)
CO2: 20 mmol/L — AB (ref 22–32)
Creatinine, Ser: 0.5 mg/dL (ref 0.44–1.00)
GFR calc non Af Amer: >60 mL/min (ref >60)
GLUCOSE: 92 mg/dL (ref 70–99)
POTASSIUM: 3.3 mmol/L — AB (ref 3.5–5.1)
Sodium: 135 mmol/L (ref 135–145)

## 2018-10-18 LAB — CBC
HEMATOCRIT: 38.7 % (ref 36.0–46.0)
HEMOGLOBIN: 12.8 g/dL (ref 12.0–15.0)
MCH: 28.6 pg (ref 26.0–34.0)
MCHC: 33.1 g/dL (ref 30.0–36.0)
MCV: 86.6 fL (ref 80.0–100.0)
NRBC: 0 % (ref 0.0–0.2)
PLATELETS: 402 10*3/uL — AB (ref 150–400)
RBC: 4.47 MIL/uL (ref 3.87–5.11)
RDW: 13.2 % (ref 11.5–15.5)
WBC: 12.1 10*3/uL — ABNORMAL HIGH (ref 4.0–10.5)

## 2018-10-18 LAB — PROTIME-INR
INR: 0.94
Prothrombin Time: 12.5 seconds (ref 11.4–15.2)

## 2018-10-18 MED ORDER — DIPHENHYDRAMINE HCL 50 MG/ML IJ SOLN
25.0000 mg | Freq: Once | INTRAMUSCULAR | Status: AC
  Administered 2018-10-18: 25 mg via INTRAVENOUS
  Filled 2018-10-18: qty 1

## 2018-10-18 MED ORDER — DIPHENHYDRAMINE HCL 25 MG PO TABS: 25.0000 mg | ORAL_TABLET | Freq: Four times a day (QID) | ORAL | 0 refills | Status: DC

## 2018-10-18 MED ORDER — LACTATED RINGERS IV BOLUS: 1000.0000 mL | Freq: Once | INTRAVENOUS | Status: DC

## 2018-10-18 MED ORDER — SODIUM CHLORIDE 0.9 % IV SOLN
Freq: Once | INTRAVENOUS | Status: AC
  Administered 2018-10-18: 08:00:00 via INTRAVENOUS

## 2018-10-18 MED ORDER — LACTATED RINGERS IV SOLN: INTRAVENOUS | Status: DC

## 2018-10-18 MED ORDER — METOCLOPRAMIDE HCL 10 MG PO TABS: 10.0000 mg | ORAL_TABLET | Freq: Four times a day (QID) | ORAL | 0 refills | Status: DC | PRN

## 2018-10-18 MED ORDER — ACETAMINOPHEN 500 MG PO TABS: 1000.0000 mg | ORAL_TABLET | Freq: Four times a day (QID) | ORAL | 0 refills | Status: DC | PRN

## 2018-10-18 MED ORDER — METOCLOPRAMIDE HCL 5 MG/ML IJ SOLN
10.0000 mg | Freq: Once | INTRAMUSCULAR | Status: AC
  Administered 2018-10-18: 10 mg via INTRAVENOUS
  Filled 2018-10-18: qty 2

## 2018-10-18 NOTE — Discharge Instructions (Signed)
1.  At this time, your headache appears likely to be a migraine headache.  You do need close follow-up to monitor this headache.  You will need to return immediately if you develop a fever, any problems with your vision, worsening headache, problems with your balance or weakness numbness or tingling of an extremity. 2.  You may take a tablet of Reglan, 2 extra strength acetaminophen and a 25 mg tablet of Benadryl every 8 hours for headache.  You have been given 6 doses of this medication.  Use sparingly.  If you need to use this every 8 hours, call your doctor or return to the emergency department for recheck.

## 2018-10-18 NOTE — ED Notes (Signed)
Pt verbalized understanding of discharge paperwork and follow-up care.  °

## 2018-10-18 NOTE — ED Notes (Signed)
Pt reports her h/a feels much better.  Rates it at 5/10.

## 2018-10-18 NOTE — ED Triage Notes (Addendum)
Pt reports headache and nausea/ vomiting since Wednesday. Reports she gets HA but normally has relief with tylenol. Reports sinus congestion and mild sore throat. Denies any neck stiffness or fevers. Pt states she is [redacted] weeks pregnant.

## 2018-10-18 NOTE — ED Provider Notes (Signed)
[facility] EMERGENCY DEPARTMENT Provider Note   CSN: [id number] Arrival date & time: 10/18/18  3716     History   Chief Complaint Chief Complaint  Patient presents with  . Headache  . Nausea    HPI Selena Barr is a 27 y.o. female.  HPI 81w5dgestation with history of type 2 diabetes taking metformin.  Patient reports headache onset 3 days ago.  Onset fairly sudden.  Headache frontal and slightly to the left.  Constant since onset.  Throbbing in quality.  Not made worse by any particular activity.  Patient denies any associated visual symptoms.  No associated dizziness or syncope.  No associated gait incoordination weakness numbness or tingling of extremities.  Patient reports she does perceive some sinus congestion and drainage.  She denies any fever, sore throat, earache or neck stiffness.  Patient reports that she is taking Tylenol which gives her some minor relief from the headache.  Patient reports that typically she gets a few headaches a month but not usually as severe as this.  She denies that she has been having headaches regularly during her pregnancy.  Patient reports that she has been having vomiting with this headache.  She reports about 4 episodes per day.  She reports she was having daily episodes of vomiting in the earlier parts of the pregnancy but that stopped about 2 months ago and now resumed when she developed this headache. Past Medical History:  Diagnosis Date  . Allergy   . Diabetes mellitus without complication ([place]   . Hyperlipidemia 10/24/2016  . Tobacco abuse 09/22/2016    Patient Active Problem List   Diagnosis Date Noted  . Rubella non-immune status, antepartum 07/24/2018  . Supervision of high risk pregnancy, antepartum 027-Aug-2019 . Death of infant 027-Aug-2019 . Mixed hyperlipidemia 10/24/2016  . Type 2 diabetes mellitus during pregnancy 09/22/2016  . Morbid obesity ([place] 09/22/2016  . Tobacco abuse 09/22/2016    Past  Surgical History:  Procedure Laterality Date  . WISDOM TOOTH EXTRACTION       OB History    Gravida  3   Para  2   Term  2   Preterm      AB      Living  1     SAB      TAB      Ectopic      Multiple      Live Births  2            Home Medications    Prior to Admission medications   Medication Sig Start Date End Date Taking? Authorizing Provider  Cholecalciferol (VITAMIN D) 2000 units CAPS Take 2,000 Units by mouth daily.   Yes [provider]  metFORMIN (GLUCOPHAGE) 1000 MG tablet Take 1 tablet (1,000 mg total) by mouth 2 (two) times daily with a meal. 05/09/18  Yes GEstill Dooms NP  Prenatal Vit-Fe Fumarate-FA (PNV PRENATAL PLUS MULTIVITAMIN) 27-1 MG TABS Take 1 tablet by mouth daily. 05/09/18  Yes GEstill Dooms NP  ACCU-CHEK AVIVA PLUS test strip USE UP TO QID TO TEST BLOOD SUGAR AS DIRECTED 05/18/18   [provider]  ACCU-CHEK SOFTCLIX LANCETS lancets USE UP TO QID TO TEST BLOOD SUGAR  AS DIRECTED 05/18/18   [provider]  acetaminophen (TYLENOL) 500 MG tablet Take 2 tablets (1,000 mg total) by mouth every 6 (six) hours as needed. 10/18/18   PCharlesetta Shanks MD  blood glucose meter kit and  supplies KIT Dispense based on patient and insurance preference. Use up to four times daily as directed. (FOR ICD-9 250.00, 250.01). 05/09/18   Estill Dooms, NP  diphenhydrAMINE (BENADRYL) 25 MG tablet Take 1 tablet (25 mg total) by mouth every 6 (six) hours. 10/18/18   Charlesetta Shanks, MD  Elastic Bandages & Supports (COMFORT FIT MATERNITY SUPP LG) MISC 1 Device by Does not apply route daily. 08/14/18   Lajean Manes, CNM  metoCLOPramide (REGLAN) 10 MG tablet Take 1 tablet (10 mg total) by mouth every 6 (six) hours as needed for nausea (nausea/headache). 10/18/18   Charlesetta Shanks, MD    Family History Family History  Problem Relation Age of Onset  . Diabetes Mother   . Alcohol abuse Mother   . COPD Mother   . Drug abuse  Mother   . Cancer Maternal Grandmother        throat  . Cancer Maternal Grandfather        throat  . Diabetes Maternal Grandfather     Social History Social History   Tobacco Use  . Smoking status: Current Every Day Smoker    Packs/day: 0.25    Years: 2.00    Pack years: 0.50    Types: Cigarettes  . Smokeless tobacco: Never Used  . Tobacco comment: "5 per day"  Substance Use Topics  . Alcohol use: No  . Drug use: No    Comment: denies use 08/28/14     Allergies   Solu-medrol [methylprednisolone acetate]   Review of Systems Review of Systems 10 Systems reviewed and are negative for acute change except as noted in the HPI.  Physical Exam Updated Vital Signs BP 139/82   Pulse (!) 104   Temp 98.5 F (36.9 C)   Resp 18   Ht 5' 3" (1.6 m)   Wt (!) 139.7 kg   LMP 04/06/2018   SpO2 100%   BMI 54.56 kg/m   Physical Exam  Constitutional: She is oriented to person, place, and time. She appears well-developed and well-nourished. No distress.  Patient is alert and well in appearance.  She is ambulatory with coordinated gait.  No signs of acute distress.  Morbid obesity.  HENT:  Nares patent without any drainage discharge or significant bogginess.  Bilateral TMs normal.  Posterior oropharynx widely patent without erythema or exudates.  No facial swelling.  Patient does endorse tenderness to percussion over the glabella and forehead above left brow.  No tenderness to palpation on the temples.  Eyes: Pupils are equal, round, and reactive to light. EOM are normal.  Bilateral fundus normal in appearance with clear optic disks.  Neck: Neck supple.  Cardiovascular: Normal rate, regular rhythm, normal heart sounds and intact distal pulses.  Pulmonary/Chest: Effort normal and breath sounds normal.  Abdominal: Soft. She exhibits no distension. There is no tenderness. There is no guarding.  Musculoskeletal: Normal range of motion. She exhibits no edema, tenderness or deformity.    Lymphadenopathy:    She has no cervical adenopathy.  Neurological: She is alert and oriented to person, place, and time. No cranial nerve deficit or sensory deficit. She exhibits normal muscle tone. Coordination normal.  Skin: Skin is warm and dry.  Psychiatric: She has a normal mood and affect.     ED Treatments / Results  Labs (all labs ordered are listed, but only abnormal results are displayed) Labs Reviewed  BASIC METABOLIC PANEL - Abnormal; Notable for the following components:      Result Value  Potassium 3.3 (*)    CO2 20 (*)    BUN 5 (*)    Calcium 8.6 (*)    All other components within normal limits  CBC - Abnormal; Notable for the following components:   WBC 12.1 (*)    Platelets 402 (*)    All other components within normal limits  URINALYSIS, ROUTINE W REFLEX MICROSCOPIC  PROTIME-INR  CBG MONITORING, ED    EKG None  Radiology No results found.  Procedures Procedures (including critical care time)  Medications Ordered in ED Medications  lactated ringers bolus 1,000 mL (has no administration in time range)  lactated ringers infusion (has no administration in time range)  0.9 %  sodium chloride infusion ( Intravenous Stopped 10/18/18 0938)  metoCLOPramide (REGLAN) injection 10 mg (10 mg Intravenous Given 10/18/18 0833)  diphenhydrAMINE (BENADRYL) injection 25 mg (25 mg Intravenous Given 10/18/18 0833)     Initial Impression / Assessment and Plan / ED Course  I have reviewed the triage vital signs and the nursing notes.  Pertinent labs & imaging results that were available during my care of the patient were reviewed by me and considered in my medical decision making (see chart for details).  Clinical Course as of Oct 18 1022  Fri Oct 18, 2018  0943 Neuro consult ordered   [MP]  [phone] Patient reports H/A much improved. Last emesis was at 04:00am. No nausea.   [MP]  1002 Consult:reviewed with Dr.Aroor. With no neuro symptoms H/A resolved with  migraine therapy, further imaging not emergent. Will give return precautions and close f/u plan.   [MP]    Clinical Course User Index [MP] Charlesetta Shanks, MD   Patient is clinically well in appearance.  She has no associated neurologic symptoms.  No complaints of any visual symptoms.  Patient does have history of headaches.  She reports history of migraines.  Patient got relief with Reglan and Benadryl.  Patient shows no signs of infectious illness.  At this time, I have low suspicion for emergent etiology such as aneurysm, tumor, venous sinus thrombosis.  Return precautions have been reviewed with the patient.  She is made aware of necessity of close follow-up.  Final Clinical Impressions(s) / ED Diagnoses   Final diagnoses:  Bad headache  [redacted] weeks gestation of pregnancy    ED Discharge Orders         Ordered    metoCLOPramide (REGLAN) 10 MG tablet  Every 6 hours PRN     10/18/18 1020    acetaminophen (TYLENOL) 500 MG tablet  Every 6 hours PRN     10/18/18 1020    diphenhydrAMINE (BENADRYL) 25 MG tablet  Every 6 hours     10/18/18 1020           Charlesetta Shanks, MD 10/18/18 1030

## 2018-10-28 ENCOUNTER — Ambulatory Visit (INDEPENDENT_AMBULATORY_CARE_PROVIDER_SITE_OTHER): Payer: Medicaid Other | Admitting: Obstetrics and Gynecology

## 2018-10-28 ENCOUNTER — Ambulatory Visit (INDEPENDENT_AMBULATORY_CARE_PROVIDER_SITE_OTHER): Payer: Medicaid Other

## 2018-10-28 VITALS — BP 137/86 | HR 98 | Wt 305.4 lb

## 2018-10-28 DIAGNOSIS — O0992 Supervision of high risk pregnancy, unspecified, second trimester: Secondary | ICD-10-CM

## 2018-10-28 DIAGNOSIS — Z3A29 29 weeks gestation of pregnancy: Secondary | ICD-10-CM

## 2018-10-28 DIAGNOSIS — Z1389 Encounter for screening for other disorder: Secondary | ICD-10-CM

## 2018-10-28 DIAGNOSIS — Z331 Pregnant state, incidental: Secondary | ICD-10-CM

## 2018-10-28 DIAGNOSIS — O24113 Pre-existing diabetes mellitus, type 2, in pregnancy, third trimester: Secondary | ICD-10-CM

## 2018-10-28 DIAGNOSIS — O0990 Supervision of high risk pregnancy, unspecified, unspecified trimester: Secondary | ICD-10-CM

## 2018-10-28 DIAGNOSIS — O0993 Supervision of high risk pregnancy, unspecified, third trimester: Secondary | ICD-10-CM

## 2018-10-28 DIAGNOSIS — O99213 Obesity complicating pregnancy, third trimester: Secondary | ICD-10-CM | POA: Insufficient documentation

## 2018-10-28 LAB — POCT URINALYSIS DIPSTICK OB
Blood, UA: NEGATIVE
GLUCOSE, UA: NEGATIVE
KETONES UA: NEGATIVE
Leukocytes, UA: NEGATIVE
NITRITE UA: NEGATIVE
PROTEIN: NEGATIVE

## 2018-10-28 NOTE — Progress Notes (Signed)
US 29+2 wks,cephalic,cx 4 cm,fhr 138 bpm,posterior placenta gr 0,normal ovaries bilat,afi 12 cm,efw 1233 g 15%

## 2018-10-28 NOTE — Progress Notes (Signed)
Patient ID: Selena Barr, female   DOB: [birth year], 27 y.o.   MRN: [id number]    [facility] Patient name: Selena Mingleiffany C Eichhorst MRN [id number]  Date of birth: [birth year] Chief Complaint:   Routine Prenatal Visit (US after visit)  History of Present Illness:   Selena Mingleiffany C Barr is a 27 y.o. 523P2001 female at 7573w2d with an Estimated Date of Delivery: 01/11/19 being seen today for ongoing management of a high-risk pregnancy complicated by A2DM, stable with Metformin. Today she reports headaches. She has one right now that started at 1:30am this morning. Tylenol helps sometimes. Her blood sugars have been good (fasting 80-90 and during the day, nothing over 115). She checks it 4x each day but did not bring her log today. Her BP has been creeping up. Pt denies vision changes, congestion and swelling. She has lost about 30 lbs throughout her pregnancy.   Contractions: Not present. Vag. Bleeding: None.  Movement: Present. denies leaking of fluid.  Review of Systems:   Pertinent items are noted in HPI Denies abnormal vaginal discharge w/ itching/odor/irritation, headaches, visual changes, shortness of breath, chest pain, abdominal pain, severe nausea/vomiting, or problems with urination or bowel movements unless otherwise stated above. Pertinent History Reviewed:  Reviewed past medical,surgical, social, obstetrical and family history.  Reviewed problem list, medications and allergies. Physical Assessment:   Vitals:   10/28/18 1056  BP: 137/86  Pulse: 98  Weight: (!) 305 lb 6.4 oz (138.5 kg)  Body mass index is 54.1 kg/m.           Physical Examination:   General appearance: alert, well appearing, and in no distress and oriented to person, place, and time  Mental status: alert, oriented to person, place, and time, normal mood, behavior, speech, dress, motor activity, and thought processes, affect appropriate to mood  Skin: warm & dry   Extremities: Edema: None    Cardiovascular:  normal heart rate noted  Respiratory: normal respiratory effort, no distress  Abdomen: gravid, soft, non-tender  Pelvic: Cervical exam deferred         Fetal Status:     Movement: Present    Fetal Surveillance Testing today: US after visit  Results for orders placed or performed in visit on 10/28/18 (from the past 24 hour(s))  POC Urinalysis Dipstick OB   Collection Time: 10/28/18 10:59 AM  Result Value Ref Range   Color, UA     Clarity, UA     Glucose, UA Negative Negative   Bilirubin, UA     Ketones, UA neg    Spec Grav, UA     Blood, UA neg    pH, UA     POC,PROTEIN,UA Negative Negative, Trace, Small (1+), Moderate (2+), Large (3+), 4+   Urobilinogen, UA     Nitrite, UA neg    Leukocytes, UA Negative Negative   Appearance     Odor      Assessment & Plan:  1) High-risk pregnancy G3P2001 at 2973w2d with an Estimated Date of Delivery: 01/11/19   2) A2DM, stable with Metformin  3) Tobacco abuse, continues to smoke throughout pregnancy  4) Morbid Obesity Body mass index is 54.1 kg/m.   5) BP slowly creeping up  Meds: No orders of the defined types were placed in this encounter.  Labs/procedures today: US after visit  Treatment Plan:  F/U in 4 wks  Follow-up: Return in about 4 weeks (around 11/25/2018) for HROB.  Orders Placed This Encounter  Procedures  . POC Urinalysis  Dipstick OB   By signing my name below, I, Pietro Cassis, attest that this documentation has been prepared under the direction and in the presence of Tilda Burrow, MD. Electronically Signed: Pietro Cassis, Medical Scribe. 10/28/18. 11:25 AM.  I personally performed the services described in this documentation, which was SCRIBED in my presence. The recorded information has been reviewed and considered accurate. It has been edited as necessary during review. Tilda Burrow, MD

## 2018-11-25 ENCOUNTER — Encounter: Payer: Self-pay | Admitting: Obstetrics & Gynecology

## 2018-11-25 ENCOUNTER — Ambulatory Visit (INDEPENDENT_AMBULATORY_CARE_PROVIDER_SITE_OTHER): Payer: Medicaid Other | Admitting: Obstetrics & Gynecology

## 2018-11-25 VITALS — BP 135/85 | HR 93 | Wt 304.0 lb

## 2018-11-25 DIAGNOSIS — O0993 Supervision of high risk pregnancy, unspecified, third trimester: Secondary | ICD-10-CM | POA: Diagnosis not present

## 2018-11-25 DIAGNOSIS — Z3A33 33 weeks gestation of pregnancy: Secondary | ICD-10-CM | POA: Diagnosis not present

## 2018-11-25 DIAGNOSIS — O24319 Unspecified pre-existing diabetes mellitus in pregnancy, unspecified trimester: Secondary | ICD-10-CM | POA: Diagnosis not present

## 2018-11-25 DIAGNOSIS — Z1389 Encounter for screening for other disorder: Secondary | ICD-10-CM

## 2018-11-25 DIAGNOSIS — Z23 Encounter for immunization: Secondary | ICD-10-CM | POA: Diagnosis not present

## 2018-11-25 DIAGNOSIS — Z331 Pregnant state, incidental: Secondary | ICD-10-CM

## 2018-11-25 NOTE — Progress Notes (Signed)
Patient ID: Selena Barr Shearon, female   DOB: 1991/06/27, 27 y.o.   MRN: [id number]   [facility] Patient name: Selena Barr Holecek MRN [id number]  Date of birth: 1991/06/27 Chief Complaint:   High Risk Gestation  History of Present Illness:   Selena Barr Dault is a 27 y.o. 233P2001 female at 1867w2d with an Estimated Date of Delivery: 01/11/19 being seen today for ongoing management of a high-risk pregnancy complicated by class  B DM.  Today she reports no complaints. Contractions: Not present. Vag. Bleeding: None.  Movement: Present. denies leaking of fluid.  Review of Systems:   Pertinent items are noted in HPI Denies abnormal vaginal discharge w/ itching/odor/irritation, headaches, visual changes, shortness of breath, chest pain, abdominal pain, severe nausea/vomiting, or problems with urination or bowel movements unless otherwise stated above. Pertinent History Reviewed:  Reviewed past medical,surgical, social, obstetrical and family history.  Reviewed problem list, medications and allergies. Physical Assessment:   Vitals:   11/25/18 0915  BP: 135/85  Pulse: 93  Weight: (!) 304 lb (137.9 kg)  Body mass index is 53.85 kg/m.           Physical Examination:   General appearance: alert, well appearing, and in no distress  Mental status: alert, oriented to person, place, and time  Skin: warm & dry   Extremities: Edema: None    Cardiovascular: normal heart rate noted  Respiratory: normal respiratory effort, no distress  Abdomen: gravid, soft, non-tender  Pelvic: Cervical exam deferred         Fetal Status:     Movement: Present    Fetal Surveillance Testing today: Reactive NST   No results found for this or any previous visit (from the past 24 hour(s)).  Assessment & Plan:  1) High-risk pregnancy G3P2001 at 6067w2d with an Estimated Date of Delivery: 01/11/19   2) Class B DM, stable, good CBG, on metformin 1000 mg BID    Meds: No orders of the defined types were placed in  this encounter.   Labs/procedures today: Reactive NST  Treatment Plan:  Twice weekly testing  Reviewed: Preterm labor symptoms and general obstetric precautions including but not limited to vaginal bleeding, contractions, leaking of fluid and fetal movement were reviewed in detail with the patient.  All questions were answered.  Follow-up: No follow-ups on file.  Orders Placed This Encounter  Procedures  . Tdap vaccine greater than or equal to 7yo IM  . POC Urinalysis Dipstick OB   Lazaro ArmsLuther H Ilan Kahrs  11/25/2018 10:28 AM

## 2018-11-28 ENCOUNTER — Encounter: Payer: Self-pay | Admitting: Obstetrics & Gynecology

## 2018-11-28 ENCOUNTER — Ambulatory Visit (INDEPENDENT_AMBULATORY_CARE_PROVIDER_SITE_OTHER): Payer: Medicaid Other | Admitting: Obstetrics & Gynecology

## 2018-11-28 VITALS — BP 134/88 | HR 99 | Wt 304.0 lb

## 2018-11-28 DIAGNOSIS — O0990 Supervision of high risk pregnancy, unspecified, unspecified trimester: Secondary | ICD-10-CM

## 2018-11-28 DIAGNOSIS — Z3A33 33 weeks gestation of pregnancy: Secondary | ICD-10-CM | POA: Diagnosis not present

## 2018-11-28 DIAGNOSIS — O0993 Supervision of high risk pregnancy, unspecified, third trimester: Secondary | ICD-10-CM | POA: Diagnosis not present

## 2018-11-28 DIAGNOSIS — O24319 Unspecified pre-existing diabetes mellitus in pregnancy, unspecified trimester: Secondary | ICD-10-CM

## 2018-11-28 DIAGNOSIS — O24313 Unspecified pre-existing diabetes mellitus in pregnancy, third trimester: Secondary | ICD-10-CM

## 2018-11-28 DIAGNOSIS — Z1389 Encounter for screening for other disorder: Secondary | ICD-10-CM

## 2018-11-28 DIAGNOSIS — Z331 Pregnant state, incidental: Secondary | ICD-10-CM

## 2018-11-28 LAB — POCT URINALYSIS DIPSTICK OB
Glucose, UA: NEGATIVE
KETONES UA: NEGATIVE
Leukocytes, UA: NEGATIVE
NITRITE UA: NEGATIVE
PROTEIN: NEGATIVE
RBC UA: NEGATIVE

## 2018-11-28 NOTE — Progress Notes (Signed)
   HIGH-RISK PREGNANCY VISIT Patient name: Selena Barr MRN [id number]  Date of birth: 1991/07/06 Chief Complaint:   Routine Prenatal Visit (NST)  History of Present Illness:   Selena Barr is a 27 y.o. 743P2001 female at 8464w5d with an Estimated Date of Delivery: 01/11/19 being seen today for ongoing management of a high-risk pregnancy complicated by class  B DM.  Today she reports no complaints. Contractions: Not present. Vag. Bleeding: None.  Movement: Present. denies leaking of fluid.  Review of Systems:   Pertinent items are noted in HPI Denies abnormal vaginal discharge w/ itching/odor/irritation, headaches, visual changes, shortness of breath, chest pain, abdominal pain, severe nausea/vomiting, or problems with urination or bowel movements unless otherwise stated above. Pertinent History Reviewed:  Reviewed past medical,surgical, social, obstetrical and family history.  Reviewed problem list, medications and allergies. Physical Assessment:   Vitals:   11/28/18 1124  BP: 134/88  Pulse: 99  Weight: (!) 304 lb (137.9 kg)  Body mass index is 53.85 kg/m.           Physical Examination:   General appearance: alert, well appearing, and in no distress  Mental status: alert, oriented to person, place, and time  Skin: warm & dry   Extremities:      Cardiovascular: normal heart rate noted  Respiratory: normal respiratory effort, no distress  Abdomen: gravid, soft, non-tender  Pelvic: Cervical exam deferred         Fetal Status: Fetal Heart Rate (bpm): 140 Fundal Height: 36 cm Movement: Present    Fetal Surveillance Testing today: reactive NST   Results for orders placed or performed in visit on 11/28/18 (from the past 24 hour(s))  POC Urinalysis Dipstick OB   Collection Time: 11/28/18 11:26 AM  Result Value Ref Range   Color, UA     Clarity, UA     Glucose, UA Negative Negative   Bilirubin, UA     Ketones, UA neg    Spec Grav, UA     Blood, UA neg    pH, UA     POC,PROTEIN,UA Negative Negative, Trace, Small (1+), Moderate (2+), Large (3+), 4+   Urobilinogen, UA     Nitrite, UA neg    Leukocytes, UA Negative Negative   Appearance     Odor      Assessment & Plan:  1) High-risk pregnancy G3P2001 at 7064w5d with an Estimated Date of Delivery: 01/11/19   2) Class B DM, stable, metformin 1000 BIDwth stable CBG    Meds: No orders of the defined types were placed in this encounter.   Labs/procedures today: Reactive NST  Treatment Plan:  Twice weekly surveillance, NST EFW 36 weeks  Reviewed: Preterm labor symptoms and general obstetric precautions including but not limited to vaginal bleeding, contractions, leaking of fluid and fetal movement were reviewed in detail with the patient.  All questions were answered.  Follow-up: Return in about 4 days (around 12/02/2018) for NST, with Dr Despina HiddenEure.  Orders Placed This Encounter  Procedures  . POC Urinalysis Dipstick OB   Lazaro ArmsLuther H Consuela Widener  11/28/2018 12:34 PM

## 2018-12-02 ENCOUNTER — Ambulatory Visit (INDEPENDENT_AMBULATORY_CARE_PROVIDER_SITE_OTHER): Payer: Medicaid Other | Admitting: Obstetrics & Gynecology

## 2018-12-02 ENCOUNTER — Other Ambulatory Visit: Payer: Medicaid Other

## 2018-12-02 ENCOUNTER — Other Ambulatory Visit: Payer: Medicaid Other | Admitting: Obstetrics & Gynecology

## 2018-12-02 VITALS — BP 132/84 | HR 108 | Wt 299.5 lb

## 2018-12-02 DIAGNOSIS — O0993 Supervision of high risk pregnancy, unspecified, third trimester: Secondary | ICD-10-CM | POA: Diagnosis not present

## 2018-12-02 DIAGNOSIS — Z3A34 34 weeks gestation of pregnancy: Secondary | ICD-10-CM | POA: Diagnosis not present

## 2018-12-02 DIAGNOSIS — Z331 Pregnant state, incidental: Secondary | ICD-10-CM

## 2018-12-02 DIAGNOSIS — O24313 Unspecified pre-existing diabetes mellitus in pregnancy, third trimester: Secondary | ICD-10-CM

## 2018-12-02 DIAGNOSIS — Z1389 Encounter for screening for other disorder: Secondary | ICD-10-CM

## 2018-12-02 DIAGNOSIS — O24319 Unspecified pre-existing diabetes mellitus in pregnancy, unspecified trimester: Secondary | ICD-10-CM

## 2018-12-02 DIAGNOSIS — O0990 Supervision of high risk pregnancy, unspecified, unspecified trimester: Secondary | ICD-10-CM

## 2018-12-02 LAB — POCT URINALYSIS DIPSTICK OB
Blood, UA: NEGATIVE
GLUCOSE, UA: NEGATIVE
KETONES UA: NEGATIVE
Leukocytes, UA: NEGATIVE
Nitrite, UA: NEGATIVE
POC,PROTEIN,UA: NEGATIVE
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
## 2018-12-04 NOTE — L&D Delivery Note (Signed)
OB/GYN Faculty Practice Delivery Note  Selena Barr is a 28 y.o. G3P2001 s/p SVD at [redacted]w[redacted]d. She was admitted for IOL for Type II Diabetes.   ROM: 7h 24m with clear fluid GBS Status: negative Maximum Maternal Temperature: Temp (24hrs), Avg:98.7 F (37.1 C), Min:98.4 F (36.9 C), Max:99 F (37.2 C)  Labor Progress: . Induction started with foley balloon and cytotec x 3 . Transitioned to pitocin . Received epidural . Pitocin off after 12 hours because of limited change and cervix still 50% effaced and firm . AROM clear fluid, IUPC placed . Buccal cytotec given . Restarted pitocin and progressed to complete  Delivery Date/Time: 01/05/19 at 1655 Delivery: Called to room and patient was complete and pushing. Head delivered ROA. Loose nuchal cord present. Shoulder and body delivered in usual fashion. Infant with spontaneous cry, placed on mother's abdomen, dried and stimulated. Cord clamped x 2 after 1-minute delay, and cut by provider. Cord blood drawn. Placenta delivered spontaneously with gentle cord traction. Fundus firm with massage and Pitocin. Labia, perineum, vagina, and cervix inspected inspected with right periurethral laceration.   Placenta: spontaneous, intact, 3-vessel cord Complications: significant amount of bleeding after delivery of placenta  methergine given and bimanual massage performed with removal of clots from lower uterine segment, uterus then firm  Lacerations: right periurethral repaired with 4-0 Monocryl EBL: EBL 922cc Analgesia: epidural  Postpartum Planning [x]  message to sent to schedule follow-up  [x]  vaccines UTD  Infant: vigorous female  APGARs 8, 84  30g  Selena Lein S. Earlene Plater, DO OB/GYN Fellow, Faculty Practice

## 2018-12-05 ENCOUNTER — Telehealth: Payer: Self-pay | Admitting: Women's Health

## 2018-12-05 ENCOUNTER — Ambulatory Visit (INDEPENDENT_AMBULATORY_CARE_PROVIDER_SITE_OTHER): Payer: Medicaid Other | Admitting: Women's Health

## 2018-12-05 ENCOUNTER — Encounter: Payer: Self-pay | Admitting: Women's Health

## 2018-12-05 VITALS — BP 129/89 | HR 101 | Wt 297.0 lb

## 2018-12-05 DIAGNOSIS — Z3A34 34 weeks gestation of pregnancy: Secondary | ICD-10-CM | POA: Diagnosis not present

## 2018-12-05 DIAGNOSIS — O0990 Supervision of high risk pregnancy, unspecified, unspecified trimester: Secondary | ICD-10-CM

## 2018-12-05 DIAGNOSIS — Z1389 Encounter for screening for other disorder: Secondary | ICD-10-CM

## 2018-12-05 DIAGNOSIS — O0993 Supervision of high risk pregnancy, unspecified, third trimester: Secondary | ICD-10-CM | POA: Diagnosis not present

## 2018-12-05 DIAGNOSIS — O24113 Pre-existing diabetes mellitus, type 2, in pregnancy, third trimester: Secondary | ICD-10-CM

## 2018-12-05 DIAGNOSIS — Z331 Pregnant state, incidental: Secondary | ICD-10-CM

## 2018-12-05 LAB — POCT URINALYSIS DIPSTICK OB
GLUCOSE, UA: NEGATIVE
KETONES UA: NEGATIVE
Leukocytes, UA: NEGATIVE
Nitrite, UA: NEGATIVE
POC,PROTEIN,UA: NEGATIVE
RBC UA: NEGATIVE

## 2018-12-05 NOTE — Progress Notes (Signed)
HIGH-RISK PREGNANCY VISIT Patient name: Selena Barr MRN [id number]  Date of birth: [birth year] Chief Complaint:   Routine Prenatal Visit (NST)  History of Present Illness:   Selena Barr is a 28 y.o. G10P2001 female at [redacted]w[redacted]d with an Estimated Date of Delivery: 01/11/19 being seen today for ongoing management of a high-risk pregnancy complicated by T2DM.  Today she reports FBS 87-111 (4 of 8 >95, gets off work at 2330, [employer], then eats around 0230- so not true fasting as she checks FBS around 0700). 2hr pp 84-136 (only 3>120). Contractions: Not present. Vag. Bleeding: None.  Movement: Present. denies leaking of fluid.  Review of Systems:   Pertinent items are noted in HPI Denies abnormal vaginal discharge w/ itching/odor/irritation, headaches, visual changes, shortness of breath, chest pain, abdominal pain, severe nausea/vomiting, or problems with urination or bowel movements unless otherwise stated above. Pertinent History Reviewed:  Reviewed past medical,surgical, social, obstetrical and family history.  Reviewed problem list, medications and allergies. Physical Assessment:   Vitals:   12/05/18 0847  BP: 129/89  Pulse: (!) 101  Weight: 297 lb (134.7 kg)  Body mass index is 52.61 kg/m.           Physical Examination:   General appearance: alert, well appearing, and in no distress  Mental status: alert, oriented to person, place, and time  Skin: warm & dry   Extremities: Edema: None    Cardiovascular: normal heart rate noted  Respiratory: normal respiratory effort, no distress  Abdomen: gravid, soft, non-tender  Pelvic: Cervical exam deferred         Fetal Status: Fetal Heart Rate (bpm): 140   Movement: Present   FH U+25cm  Fetal Surveillance Testing today: NST: FHR baseline 140 bpm, Variability: moderate, Accelerations:present, Decelerations:  Absent= Cat 1/Reactive Toco: none     Results for orders placed or performed in visit on 12/05/18 (from the past 24  hour(s))  POC Urinalysis Dipstick OB   Collection Time: 12/05/18  8:49 AM  Result Value Ref Range   Color, UA     Clarity, UA     Glucose, UA Negative Negative   Bilirubin, UA     Ketones, UA neg    Spec Grav, UA     Blood, UA neg    pH, UA     POC,PROTEIN,UA Negative Negative, Trace, Small (1+), Moderate (2+), Large (3+), 4+   Urobilinogen, UA     Nitrite, UA neg    Leukocytes, UA Negative Negative   Appearance     Odor      Assessment & Plan:  1) High-risk pregnancy G3P2001 at [redacted]w[redacted]d with an Estimated Date of Delivery: 01/11/19   2) T2DM, stable, 1/2 FBS >95 but not true fasting-eating at 0230, to start eating as soon as she gets off work @ 2330  3) Wants BTL> reviewed r/b, high incidence regret <30yo, LARCS just as effective, consent signed today  Meds: No orders of the defined types were placed in this encounter.  Labs/procedures today: nst  Treatment Plan:  Growth u/s Mon (last one at 29wks), then 38wks     2x/wk testing      Deliver @ 39wks  Reviewed: Preterm labor symptoms and general obstetric precautions including but not limited to vaginal bleeding, contractions, leaking of fluid and fetal movement were reviewed in detail with the patient.  All questions were answered.  Follow-up: Return for Mon hrob, bpp/efw u/s; then Thurs hrob/nst, Sign BTL consent today.  Orders Placed This  Encounter  Procedures  . US OB Follow Up  . US FETAL BPP WO NON STRESS  . POC Urinalysis Dipstick OB   Cheral Marker CNM, [facility] 12/05/2018 9:50 AM

## 2018-12-05 NOTE — Telephone Encounter (Signed)
Pt needs to have a BPP on Monday at [facility]

## 2018-12-05 NOTE — Patient Instructions (Signed)
Selena Barr, I greatly value your feedback.  If you receive a survey following your visit with [place] today, we appreciate you taking the time to fill it out.  Thanks, [name], CNM, WHNP-BC   Call the office [phone]) or go to [facility] if:  You begin to have strong, frequent contractions  Your water breaks.  Sometimes it is a big gush of fluid, sometimes it is just a trickle that keeps getting your panties wet or running down your legs  You have vaginal bleeding.  It is normal to have a small amount of spotting if your cervix was checked.   You don't feel your baby moving like normal.  If you don't, get you something to eat and drink and lay down and focus on feeling your baby move.  You should feel at least 10 movements in 2 hours.  If you don't, you should call the office or go to [facility].     Preterm Labor and Birth Information  The normal length of a pregnancy is 39-41 weeks. Preterm labor is when labor starts before 37 completed weeks of pregnancy. What are the risk factors for preterm labor? Preterm labor is more likely to occur in women who:  Have certain infections during pregnancy such as a bladder infection, sexually transmitted infection, or infection inside the uterus (chorioamnionitis).  Have a shorter-than-normal cervix.  Have gone into preterm labor before.  Have had surgery on their cervix.  Are younger than age 65 or older than age 28.  Are African American.  Are pregnant with twins or multiple babies (multiple gestation).  Take street drugs or smoke while pregnant.  Do not gain enough weight while pregnant.  Became pregnant shortly after having been pregnant. What are the symptoms of preterm labor? Symptoms of preterm labor include:  Cramps similar to those that can happen during a menstrual period. The cramps may happen with diarrhea.  Pain in the abdomen or lower back.  Regular uterine contractions that may feel like tightening  of the abdomen.  A feeling of increased pressure in the pelvis.  Increased watery or bloody mucus discharge from the vagina.  Water breaking (ruptured amniotic sac). Why is it important to recognize signs of preterm labor? It is important to recognize signs of preterm labor because babies who are born prematurely may not be fully developed. This can put them at an increased risk for:  Long-term (chronic) heart and lung problems.  Difficulty immediately after birth with regulating body systems, including blood sugar, body temperature, heart rate, and breathing rate.  Bleeding in the brain.  Cerebral palsy.  Learning difficulties.  Death. These risks are highest for babies who are born before 34 weeks of pregnancy. How is preterm labor treated? Treatment depends on the length of your pregnancy, your condition, and the health of your baby. It may involve:  Having a stitch (suture) placed in your cervix to prevent your cervix from opening too early (cerclage).  Taking or being given medicines, such as: ? Hormone medicines. These may be given early in pregnancy to help support the pregnancy. ? Medicine to stop contractions. ? Medicines to help mature the baby's lungs. These may be prescribed if the risk of delivery is high. ? Medicines to prevent your baby from developing cerebral palsy. If the labor happens before 34 weeks of pregnancy, you may need to stay in the hospital. What should I do if I think I am in preterm labor? If you think that  you are going into preterm labor, call your health care provider right away. How can I prevent preterm labor in future pregnancies? To increase your chance of having a full-term pregnancy:  Do not use any tobacco products, such as cigarettes, chewing tobacco, and e-cigarettes. If you need help quitting, ask your health care provider.  Do not use street drugs or medicines that have not been prescribed to you during your pregnancy.  Talk with  your health care provider before taking any herbal supplements, even if you have been taking them regularly.  Make sure you gain a healthy amount of weight during your pregnancy.  Watch for infection. If you think that you might have an infection, get it checked right away.  Make sure to tell your health care provider if you have gone into preterm labor before. This information is not intended to replace advice given to you by your health care provider. Make sure you discuss any questions you have with your health care provider. Document Released: 02/10/2004 Document Revised: 05/02/2016 Document Reviewed: 04/12/2016 Elsevier Interactive Patient Education  2019 [employer].

## 2018-12-09 ENCOUNTER — Encounter: Payer: Self-pay | Admitting: Obstetrics and Gynecology

## 2018-12-09 ENCOUNTER — Other Ambulatory Visit: Payer: Self-pay

## 2018-12-12 ENCOUNTER — Ambulatory Visit (INDEPENDENT_AMBULATORY_CARE_PROVIDER_SITE_OTHER): Payer: Medicaid Other | Admitting: Obstetrics & Gynecology

## 2018-12-12 ENCOUNTER — Other Ambulatory Visit: Payer: Self-pay

## 2018-12-12 ENCOUNTER — Encounter: Payer: Self-pay | Admitting: Obstetrics & Gynecology

## 2018-12-12 VITALS — BP 126/85 | HR 89 | Wt 299.0 lb

## 2018-12-12 DIAGNOSIS — O24319 Unspecified pre-existing diabetes mellitus in pregnancy, unspecified trimester: Secondary | ICD-10-CM | POA: Diagnosis not present

## 2018-12-12 DIAGNOSIS — Z3A35 35 weeks gestation of pregnancy: Secondary | ICD-10-CM

## 2018-12-12 DIAGNOSIS — O0990 Supervision of high risk pregnancy, unspecified, unspecified trimester: Secondary | ICD-10-CM

## 2018-12-12 DIAGNOSIS — Z1389 Encounter for screening for other disorder: Secondary | ICD-10-CM

## 2018-12-12 DIAGNOSIS — Z331 Pregnant state, incidental: Secondary | ICD-10-CM

## 2018-12-12 LAB — POCT URINALYSIS DIPSTICK OB
Blood, UA: NEGATIVE
Glucose, UA: NEGATIVE
KETONES UA: NEGATIVE
Leukocytes, UA: NEGATIVE
Nitrite, UA: NEGATIVE
POC,PROTEIN,UA: NEGATIVE

## 2018-12-12 NOTE — Progress Notes (Signed)
   HIGH-RISK PREGNANCY VISIT Patient name: Selena Barr MRN [id number]  Date of birth: 17-Jun-1991 Chief Complaint:   High Risk Gestation (NST)  History of Present Illness:   Selena Barr is a 28 y.o. 383P2001 female at 8253w5d with an Estimated Date of Delivery: 01/11/19 being seen today for ongoing management of a high-risk pregnancy complicated by class  B DM.  Today she reports no complaints. Contractions: Not present. Vag. Bleeding: None.  Movement: Present. denies leaking of fluid.  Review of Systems:   Pertinent items are noted in HPI Denies abnormal vaginal discharge w/ itching/odor/irritation, headaches, visual changes, shortness of breath, chest pain, abdominal pain, severe nausea/vomiting, or problems with urination or bowel movements unless otherwise stated above. Pertinent History Reviewed:  Reviewed past medical,surgical, social, obstetrical and family history.  Reviewed problem list, medications and allergies. Physical Assessment:   Vitals:   12/12/18 1444  BP: 126/85  Pulse: 89  Weight: 299 lb (135.6 kg)  Body mass index is 52.97 kg/m.           Physical Examination:   General appearance: alert, well appearing, and in no distress  Mental status: alert, oriented to person, place, and time  Skin: warm & dry   Extremities: Edema: None    Cardiovascular: normal heart rate noted  Respiratory: normal respiratory effort, no distress  Abdomen: gravid, soft, non-tender  Pelvic: Cervical exam deferred         Fetal Status: Fetal Heart Rate (bpm): 140 Fundal Height: 40 cm Movement: Present    Fetal Surveillance Testing today: Reactive NST   Results for orders placed or performed in visit on 12/12/18 (from the past 24 hour(s))  POC Urinalysis Dipstick OB   Collection Time: 12/12/18  2:44 PM  Result Value Ref Range   Color, UA     Clarity, UA     Glucose, UA Negative Negative   Bilirubin, UA     Ketones, UA neg    Spec Grav, UA     Blood, UA neg    pH, UA     POC,PROTEIN,UA Negative Negative, Trace, Small (1+), Moderate (2+), Large (3+), 4+   Urobilinogen, UA     Nitrite, UA neg    Leukocytes, UA Negative Negative   Appearance     Odor      Assessment & Plan:  1) High-risk pregnancy G3P2001 at 6753w5d with an Estimated Date of Delivery: 01/11/19   2) Class B DM, stable metformin 1000 mg BID    Meds: No orders of the defined types were placed in this encounter.   Labs/procedures today: reactive NST  Treatment Plan:  Twice weekly surveillance IOL 39 weeks EFW 36-37 weeks  Reviewed: Preterm labor symptoms and general obstetric precautions including but not limited to vaginal bleeding, contractions, leaking of fluid and fetal movement were reviewed in detail with the patient.  All questions were answered.  Follow-up: Return in about 4 days (around 12/16/2018) for NST, HROB, schedule EFW sonogram for 12/19/2018.  Orders Placed This Encounter  Procedures  . US OB Follow Up  . POC Urinalysis Dipstick OB   Lazaro ArmsLuther H Sender Rueb  12/12/2018 3:14 PM

## 2018-12-16 ENCOUNTER — Other Ambulatory Visit: Payer: Self-pay

## 2018-12-16 ENCOUNTER — Ambulatory Visit (INDEPENDENT_AMBULATORY_CARE_PROVIDER_SITE_OTHER): Payer: Medicaid Other | Admitting: Family Medicine

## 2018-12-16 ENCOUNTER — Encounter: Payer: Self-pay | Admitting: Family Medicine

## 2018-12-16 VITALS — BP 112/68 | HR 95 | Wt 298.0 lb

## 2018-12-16 DIAGNOSIS — O0993 Supervision of high risk pregnancy, unspecified, third trimester: Secondary | ICD-10-CM

## 2018-12-16 DIAGNOSIS — O24113 Pre-existing diabetes mellitus, type 2, in pregnancy, third trimester: Secondary | ICD-10-CM

## 2018-12-16 DIAGNOSIS — O99213 Obesity complicating pregnancy, third trimester: Secondary | ICD-10-CM

## 2018-12-16 DIAGNOSIS — Z331 Pregnant state, incidental: Secondary | ICD-10-CM

## 2018-12-16 DIAGNOSIS — O09899 Supervision of other high risk pregnancies, unspecified trimester: Secondary | ICD-10-CM

## 2018-12-16 DIAGNOSIS — Z3A36 36 weeks gestation of pregnancy: Secondary | ICD-10-CM | POA: Diagnosis not present

## 2018-12-16 DIAGNOSIS — Z1389 Encounter for screening for other disorder: Secondary | ICD-10-CM

## 2018-12-16 DIAGNOSIS — O9989 Other specified diseases and conditions complicating pregnancy, childbirth and the puerperium: Secondary | ICD-10-CM

## 2018-12-16 DIAGNOSIS — Z283 Underimmunization status: Secondary | ICD-10-CM

## 2018-12-16 LAB — POCT URINALYSIS DIPSTICK OB
GLUCOSE, UA: NEGATIVE
KETONES UA: NEGATIVE
Leukocytes, UA: NEGATIVE
Nitrite, UA: NEGATIVE
POC,PROTEIN,UA: NEGATIVE
RBC UA: NEGATIVE

## 2018-12-16 LAB — OB RESULTS CONSOLE GBS: GBS: NEGATIVE

## 2018-12-16 NOTE — Progress Notes (Signed)
   HIGH-RISK PREGNANCY VISIT Patient name: Selena Barr MRN [id number]  Date of birth: October 08, 1991 Chief Complaint:   High Risk Gestation (NST; gbs/gc)  History of Present Illness:   Selena Barr is a 28 y.o. G53P2001 female at [redacted]w[redacted]d with an Estimated Date of Delivery: 01/11/19 being seen today for ongoing management of a high-risk pregnancy complicated by class  B DM.  Today she reports no complaints. Contractions: Not present. Vag. Bleeding: None.  Movement: Present. denies leaking of fluid.   Review of Systems:   Pertinent items are noted in HPI Denies abnormal vaginal discharge w/ itching/odor/irritation, headaches, visual changes, shortness of breath, chest pain, abdominal pain, severe nausea/vomiting, or problems with urination or bowel movements unless otherwise stated above. Pertinent History Reviewed:  Reviewed past medical,surgical, social, obstetrical and family history.  Reviewed problem list, medications and allergies. Physical Assessment:   Vitals:   12/16/18 0942  BP: 112/68  Pulse: 95  Weight: 298 lb (135.2 kg)  Body mass index is 52.79 kg/m.           Physical Examination:   General appearance: alert, well appearing, and in no distress  Mental status: alert, oriented to person, place, and time  Skin: warm & dry   Extremities: Edema: None    Cardiovascular: normal heart rate noted  Respiratory: normal respiratory effort, no distress  Abdomen: gravid, soft, non-tender  Pelvic: Cervical exam deferred         Fetal Status:     Movement: Present    Fetal Surveillance Testing today: Reactive NST   Results for orders placed or performed in visit on 12/16/18 (from the past 24 hour(s))  POC Urinalysis Dipstick OB   Collection Time: 12/16/18  9:41 AM  Result Value Ref Range   Color, UA     Clarity, UA     Glucose, UA Negative Negative   Bilirubin, UA     Ketones, UA neg    Spec Grav, UA     Blood, UA neg    pH, UA     POC,PROTEIN,UA Negative Negative,  Trace, Small (1+), Moderate (2+), Large (3+), 4+   Urobilinogen, UA     Nitrite, UA neg    Leukocytes, UA Negative Negative   Appearance     Odor      Assessment & Plan:  1) High-risk pregnancy G3P2001 at [redacted]w[redacted]d with an Estimated Date of Delivery: 01/11/19   2) Class B DM, stable. Only 3-4 above goal values weekly. Mostly fasting BS but only minimally elevated in the 95-99 range. Continue metformin 1000 mg BID. Plan for IOL at 39 wks   Meds: No orders of the defined types were placed in this encounter.   Labs/procedures today: reactive NST  Treatment Plan:  Twice weekly surveillance IOL 39 weeks EFW 36-37 weeks  Reviewed: Preterm labor symptoms and general obstetric precautions including but not limited to vaginal bleeding, contractions, leaking of fluid and fetal movement were reviewed in detail with the patient.  All questions were answered.  Follow-up: Return for Scheduled prenatal/NST.   Future Appointments  Date Time Provider Department Center  12/19/2018  8:30 AM [facility] [place] [facility] None  12/19/2018  9:15 AM Eure, Amaryllis Dyke, MD [facility]    Orders Placed This Encounter  Procedures  . Culture, beta strep (group b only)  . GC/Chlamydia Probe Amp  . POC Urinalysis Dipstick OB   Isa Rankin [facility]  12/16/2018 12:36 PM

## 2018-12-19 ENCOUNTER — Ambulatory Visit (INDEPENDENT_AMBULATORY_CARE_PROVIDER_SITE_OTHER): Payer: Medicaid Other | Admitting: Obstetrics & Gynecology

## 2018-12-19 ENCOUNTER — Other Ambulatory Visit: Payer: Self-pay | Admitting: Obstetrics & Gynecology

## 2018-12-19 ENCOUNTER — Encounter: Payer: Self-pay | Admitting: Obstetrics & Gynecology

## 2018-12-19 ENCOUNTER — Other Ambulatory Visit: Payer: Self-pay

## 2018-12-19 ENCOUNTER — Ambulatory Visit (INDEPENDENT_AMBULATORY_CARE_PROVIDER_SITE_OTHER): Payer: Medicaid Other

## 2018-12-19 VITALS — BP 120/82 | HR 95 | Wt 296.0 lb

## 2018-12-19 DIAGNOSIS — O365930 Maternal care for other known or suspected poor fetal growth, third trimester, not applicable or unspecified: Secondary | ICD-10-CM

## 2018-12-19 DIAGNOSIS — Z3A36 36 weeks gestation of pregnancy: Secondary | ICD-10-CM

## 2018-12-19 DIAGNOSIS — Z331 Pregnant state, incidental: Secondary | ICD-10-CM

## 2018-12-19 DIAGNOSIS — O24113 Pre-existing diabetes mellitus, type 2, in pregnancy, third trimester: Secondary | ICD-10-CM

## 2018-12-19 DIAGNOSIS — O24319 Unspecified pre-existing diabetes mellitus in pregnancy, unspecified trimester: Secondary | ICD-10-CM | POA: Diagnosis not present

## 2018-12-19 DIAGNOSIS — O0993 Supervision of high risk pregnancy, unspecified, third trimester: Secondary | ICD-10-CM

## 2018-12-19 DIAGNOSIS — Z1389 Encounter for screening for other disorder: Secondary | ICD-10-CM

## 2018-12-19 DIAGNOSIS — O0990 Supervision of high risk pregnancy, unspecified, unspecified trimester: Secondary | ICD-10-CM

## 2018-12-19 DIAGNOSIS — O26843 Uterine size-date discrepancy, third trimester: Secondary | ICD-10-CM

## 2018-12-19 LAB — POCT URINALYSIS DIPSTICK OB
Glucose, UA: NEGATIVE
KETONES UA: NEGATIVE
LEUKOCYTES UA: NEGATIVE
NITRITE UA: NEGATIVE
POC,PROTEIN,UA: NEGATIVE
RBC UA: NEGATIVE

## 2018-12-19 LAB — GC/CHLAMYDIA PROBE AMP
CHLAMYDIA, DNA PROBE: NEGATIVE
Neisseria gonorrhoeae by PCR: NEGATIVE

## 2018-12-19 NOTE — Progress Notes (Signed)
[place] 36+5 wks,cephalic,posterior placenta gr 3,BPP 8/8,AFI 16 cm,RI .58,.56,.58,.59=56%,FHR 144 bpm,efw 2383 g 6.9%

## 2018-12-19 NOTE — Progress Notes (Signed)
   HIGH-RISK PREGNANCY VISIT Patient name: Selena Barr MRN [id number]  Date of birth: 08-13-1991 Chief Complaint:   High Risk Gestation (u/s today)  History of Present Illness:   Selena Barr is a 28 y.o. G79P2001 female at [redacted]w[redacted]d with an Estimated Date of Delivery: 01/11/19 being seen today for ongoing management of a high-risk pregnancy complicated by class  B DM.  Today she reports no complaints. Contractions: Not present. Vag. Bleeding: None.  Movement: Present. denies leaking of fluid.  Review of Systems:   Pertinent items are noted in HPI Denies abnormal vaginal discharge w/ itching/odor/irritation, headaches, visual changes, shortness of breath, chest pain, abdominal pain, severe nausea/vomiting, or problems with urination or bowel movements unless otherwise stated above. Pertinent History Reviewed:  Reviewed past medical,surgical, social, obstetrical and family history.  Reviewed problem list, medications and allergies. Physical Assessment:   Vitals:   12/19/18 0935  BP: 120/82  Pulse: 95  Weight: 296 lb (134.3 kg)  Body mass index is 52.43 kg/m.           Physical Examination:   General appearance: alert, well appearing, and in no distress  Mental status: alert, oriented to person, place, and time  Skin: warm & dry   Extremities: Edema: None    Cardiovascular: normal heart rate noted  Respiratory: normal respiratory effort, no distress  Abdomen: gravid, soft, non-tender  Pelvic: Cervical exam deferred         Fetal Status:     Movement: Present    Fetal Surveillance Testing today: sonogram BPP 8/8   No results found for this or any previous visit (from the past 24 hour(s)).  Assessment & Plan:  1) High-risk pregnancy G3P2001 at [redacted]w[redacted]d with an Estimated Date of Delivery: 01/11/19   2) Class B DM, stable, see sonogram report    Meds: No orders of the defined types were placed in this encounter.   Labs/procedures today:   Treatment Plan:  Twice weekly  surveillance with IOL 39 weeks  Reviewed: Term labor symptoms and general obstetric precautions including but not limited to vaginal bleeding, contractions, leaking of fluid and fetal movement were reviewed in detail with the patient.  All questions were answered.  Follow-up: Return in about 4 days (around 12/23/2018) for NST, HROB.  Orders Placed This Encounter  Procedures  . POC Urinalysis Dipstick OB   Lazaro Arms CNM

## 2018-12-20 LAB — CULTURE, BETA STREP (GROUP B ONLY): Strep Gp B Culture: NEGATIVE

## 2018-12-23 ENCOUNTER — Other Ambulatory Visit: Payer: Medicaid Other

## 2018-12-23 ENCOUNTER — Ambulatory Visit (INDEPENDENT_AMBULATORY_CARE_PROVIDER_SITE_OTHER): Payer: Medicaid Other | Admitting: Obstetrics & Gynecology

## 2018-12-23 VITALS — BP 140/90 | HR 92 | Wt 298.0 lb

## 2018-12-23 DIAGNOSIS — Z3A37 37 weeks gestation of pregnancy: Secondary | ICD-10-CM | POA: Diagnosis not present

## 2018-12-23 DIAGNOSIS — O0990 Supervision of high risk pregnancy, unspecified, unspecified trimester: Secondary | ICD-10-CM

## 2018-12-23 DIAGNOSIS — O24313 Unspecified pre-existing diabetes mellitus in pregnancy, third trimester: Secondary | ICD-10-CM | POA: Diagnosis not present

## 2018-12-23 DIAGNOSIS — O0993 Supervision of high risk pregnancy, unspecified, third trimester: Secondary | ICD-10-CM | POA: Diagnosis not present

## 2018-12-23 DIAGNOSIS — O24319 Unspecified pre-existing diabetes mellitus in pregnancy, unspecified trimester: Secondary | ICD-10-CM

## 2018-12-23 DIAGNOSIS — O365931 Maternal care for other known or suspected poor fetal growth, third trimester, fetus 1: Secondary | ICD-10-CM

## 2018-12-23 DIAGNOSIS — Z331 Pregnant state, incidental: Secondary | ICD-10-CM

## 2018-12-23 DIAGNOSIS — Z1389 Encounter for screening for other disorder: Secondary | ICD-10-CM

## 2018-12-23 NOTE — Progress Notes (Signed)
   HIGH-RISK PREGNANCY VISIT Patient name: Selena Barr Steier MRN [id number]  Date of birth: 10-29-1991 Chief Complaint:   High Risk Gestation (NST)  History of Present Illness:   Selena Barr Gandolfi is a 28 y.o. 173P2001 female at 6010w2d with an Estimated Date of Delivery: 01/11/19 being seen today for ongoing management of a high-risk pregnancy complicated by class  B DM, FGR.  Today she reports no complaints. Contractions: Not present. Vag. Bleeding: None.  Movement: Present. denies leaking of fluid.  Review of Systems:   Pertinent items are noted in HPI Denies abnormal vaginal discharge w/ itching/odor/irritation, headaches, visual changes, shortness of breath, chest pain, abdominal pain, severe nausea/vomiting, or problems with urination or bowel movements unless otherwise stated above. Pertinent History Reviewed:  Reviewed past medical,surgical, social, obstetrical and family history.  Reviewed problem list, medications and allergies. Physical Assessment:   Vitals:   12/23/18 1130  BP: 140/90  Pulse: 92  Weight: 298 lb (135.2 kg)  Body mass index is 52.79 kg/m.           Physical Examination:   General appearance: alert, well appearing, and in no distress  Mental status: alert, oriented to person, place, and time  Skin: warm & dry   Extremities: Edema: None    Cardiovascular: normal heart rate noted  Respiratory: normal respiratory effort, no distress  Abdomen: gravid, soft, non-tender  Pelvic: Cervical exam deferred         Fetal Status: Fetal Heart Rate (bpm): 135 Fundal Height: 42 cm Movement: Present    Fetal Surveillance Testing today: Reactive NST   No results found for this or any previous visit (from the past 24 hour(s)).  Assessment & Plan:  1) High-risk pregnancy G3P2001 at 7510w2d with an Estimated Date of Delivery: 01/11/19   2) Class B DM, stable  3) FGR, stable  Meds: No orders of the defined types were placed in this encounter.   Labs/procedures today: Reactive  NST  Treatment Plan:  Because of recent Dx FGR, will switch to alternating sonogram and NST for surveillance, still plan IOL 39 weeks  Reviewed: Term labor symptoms and general obstetric precautions including but not limited to vaginal bleeding, contractions, leaking of fluid and fetal movement were reviewed in detail with the patient.  All questions were answered.  Follow-up: Return in about 3 days (around 12/26/2018) for BPP/sono, HROB.  Orders Placed This Encounter  Procedures  . POC Urinalysis Dipstick OB   Amaryllis DykeLuther H Tierney Behl  12/23/2018 12:04 PM

## 2018-12-25 ENCOUNTER — Other Ambulatory Visit: Payer: Self-pay | Admitting: Obstetrics & Gynecology

## 2018-12-25 DIAGNOSIS — O24419 Gestational diabetes mellitus in pregnancy, unspecified control: Secondary | ICD-10-CM

## 2018-12-25 DIAGNOSIS — O365930 Maternal care for other known or suspected poor fetal growth, third trimester, not applicable or unspecified: Secondary | ICD-10-CM

## 2018-12-26 ENCOUNTER — Ambulatory Visit (INDEPENDENT_AMBULATORY_CARE_PROVIDER_SITE_OTHER): Payer: Medicaid Other | Admitting: Advanced Practice Midwife

## 2018-12-26 ENCOUNTER — Ambulatory Visit (INDEPENDENT_AMBULATORY_CARE_PROVIDER_SITE_OTHER): Payer: Medicaid Other

## 2018-12-26 ENCOUNTER — Encounter: Payer: Self-pay | Admitting: Advanced Practice Midwife

## 2018-12-26 VITALS — BP 128/80 | HR 91 | Wt 297.0 lb

## 2018-12-26 DIAGNOSIS — O365930 Maternal care for other known or suspected poor fetal growth, third trimester, not applicable or unspecified: Secondary | ICD-10-CM

## 2018-12-26 DIAGNOSIS — O0990 Supervision of high risk pregnancy, unspecified, unspecified trimester: Secondary | ICD-10-CM

## 2018-12-26 DIAGNOSIS — Z1389 Encounter for screening for other disorder: Secondary | ICD-10-CM

## 2018-12-26 DIAGNOSIS — O24113 Pre-existing diabetes mellitus, type 2, in pregnancy, third trimester: Secondary | ICD-10-CM

## 2018-12-26 DIAGNOSIS — O365990 Maternal care for other known or suspected poor fetal growth, unspecified trimester, not applicable or unspecified: Secondary | ICD-10-CM

## 2018-12-26 DIAGNOSIS — Z331 Pregnant state, incidental: Secondary | ICD-10-CM

## 2018-12-26 DIAGNOSIS — O24419 Gestational diabetes mellitus in pregnancy, unspecified control: Secondary | ICD-10-CM

## 2018-12-26 DIAGNOSIS — O0993 Supervision of high risk pregnancy, unspecified, third trimester: Secondary | ICD-10-CM

## 2018-12-26 LAB — POCT URINALYSIS DIPSTICK OB
GLUCOSE, UA: NEGATIVE
Ketones, UA: NEGATIVE
Leukocytes, UA: NEGATIVE
Nitrite, UA: NEGATIVE
POC,PROTEIN,UA: NEGATIVE
RBC UA: NEGATIVE

## 2018-12-26 NOTE — Progress Notes (Signed)
[place] 37+5 wks,cephalic,BPP 8/8,FHR 138 bpm,RI .53,.58,.57=51%,AFI 17 cm,posterior placenta gr 3

## 2018-12-26 NOTE — Progress Notes (Signed)
HIGH-RISK PREGNANCY VISIT Patient name: Selena Barr MRN [id number]  Date of birth: 1991-07-14 Chief Complaint:   High Risk Gestation (u/s today)  History of Present Illness:   Selena Barr is a 28 y.o. G60P2001 female at 82w5dwith an Estimated Date of Delivery: 01/11/19 being seen today for ongoing management of a high-risk pregnancy complicated by class  B DM, FGR, dx at 36 weeks.  Today she reports no complaints. Contractions: Not present. Vag. Bleeding: None.  Movement: Present. denies leaking of fluid.  Review of Systems:   Pertinent items are noted in HPI Denies abnormal vaginal discharge w/ itching/odor/irritation, headaches, visual changes, shortness of breath, chest pain, abdominal pain, severe nausea/vomiting, or problems with urination or bowel movements unless otherwise stated above.    Pertinent History Reviewed:  Medical & Surgical Hx:   Past Medical History:  Diagnosis Date  . Allergy   . [place]   . Hyperlipidemia 10/24/2016  . Tobacco abuse 09/22/2016   Past Surgical History:  Procedure Laterality Date  . WISDOM TOOTH EXTRACTION     Family History  Problem Relation Age of Onset  . Diabetes Mother   . Alcohol abuse Mother   . COPD Mother   . Drug abuse Mother   . Cancer Maternal Grandmother        throat  . Cancer Maternal Grandfather        throat  . Diabetes Maternal Grandfather     Current Outpatient Medications:  .  ACCU-CHEK AVIVA PLUS test strip, USE UP TO QID TO TEST BLOOD SUGAR AS DIRECTED, Disp: , Rfl: 0 .  ACCU-CHEK SOFTCLIX LANCETS lancets, USE UP TO QID TO TEST BLOOD SUGAR  AS DIRECTED, Disp: , Rfl: 0 .  blood glucose meter kit and supplies KIT, Dispense based on patient and insurance preference. Use up to four times daily as directed. (FOR ICD-9 250.00, 250.01)., Disp: 1 each, Rfl: prn .  Cholecalciferol (VITAMIN D) 2000 units CAPS, Take 2,000 Units by mouth daily., Disp: , Rfl:  .  Elastic Bandages &  Supports (COMFORT FIT MATERNITY SUPP LG) MISC, 1 Device by Does not apply route daily., Disp: 1 each, Rfl: 0 .  metFORMIN (GLUCOPHAGE) 1000 MG tablet, Take 1 tablet (1,000 mg total) by mouth 2 (two) times daily with a meal., Disp: 180 tablet, Rfl: 3 .  Prenatal Vit-Fe Fumarate-FA (PNV PRENATAL PLUS MULTIVITAMIN) 27-1 MG TABS, Take 1 tablet by mouth daily., Disp: 30 tablet, Rfl: 11 Social History: Reviewed -  reports that she has been smoking cigarettes. She has a 0.50 pack-year smoking history. She has never used smokeless tobacco.   Physical Assessment:   Vitals:   12/26/18 0957  BP: 128/80  Pulse: 91  Weight: 297 lb (134.7 kg)  Body mass index is 52.61 kg/m.           Physical Examination:   General appearance: alert, well appearing, and in no distress  Mental status: alert, oriented to person, place, and time  Skin: warm & dry   Extremities: Edema: None    Cardiovascular: normal heart rate noted  Respiratory: normal respiratory effort, no distress  Abdomen: gravid, soft, non-tender  Pelvic: Cervical exam deferred         Fetal Status: Fetal Heart Rate (bpm): 138us   Movement: Present    Fetal Surveillance Testing today: [place]318+8wks,cephalic,BPP 84/1,YSA1630bpm,RI .53,.58,.57=51%,AFI 17 cm,posterior placenta gr 3  Results for orders placed or performed in visit on 12/26/18 (from the  past 24 hour(s))  POC Urinalysis Dipstick OB   Collection Time: 12/26/18  9:57 AM  Result Value Ref Range   Color, UA     Clarity, UA     Glucose, UA Negative Negative   Bilirubin, UA     Ketones, UA neg    Spec Grav, UA     Blood, UA neg    pH, UA     POC,PROTEIN,UA Negative Negative, Trace, Small (1+), Moderate (2+), Large (3+), 4+   Urobilinogen, UA     Nitrite, UA neg    Leukocytes, UA Negative Negative   Appearance     Odor      Assessment & Plan:  1) High-risk pregnancy G3P2001 at 57w5dwith an Estimated Date of Delivery: 01/11/19   2) Class B DM, stable. All BS normal Treatment;   Continue MEtformin 1gm BID;  Plan:  Per FGR,  3) FGR, stable.  Treatment Plan:  US/NST, IOL 39 weeks  Labs/procedures today: BPP/dopplers   Follow-up: Return for Monday for NST/HROB and Thurs for HROB/BPP/dopplers.  Future Appointments  Date Time Provider [place] 12/26/2018 10:15 AM CChristin Fudge CNM [facility]  12/30/2018 10:00 AM BRoma Schanz CNM [facility]  01/02/2019 11:45 AM [facility] [place] None  01/02/2019 12:15 PM FJonnie Kind MD [facility]    Orders Placed This Encounter  Procedures  . [place] BPP WO NON STRESS  . [place] Cord Doppler  . POC Urinalysis Dipstick OB   FChristin FudgeCNM 12/26/2018 10:11 AM

## 2018-12-30 ENCOUNTER — Ambulatory Visit (INDEPENDENT_AMBULATORY_CARE_PROVIDER_SITE_OTHER): Payer: Medicaid Other | Admitting: Women's Health

## 2018-12-30 ENCOUNTER — Encounter: Payer: Self-pay | Admitting: Women's Health

## 2018-12-30 VITALS — BP 118/74 | HR 92 | Wt 296.2 lb

## 2018-12-30 DIAGNOSIS — Z3A38 38 weeks gestation of pregnancy: Secondary | ICD-10-CM | POA: Diagnosis not present

## 2018-12-30 DIAGNOSIS — O24113 Pre-existing diabetes mellitus, type 2, in pregnancy, third trimester: Secondary | ICD-10-CM

## 2018-12-30 DIAGNOSIS — O0993 Supervision of high risk pregnancy, unspecified, third trimester: Secondary | ICD-10-CM | POA: Diagnosis not present

## 2018-12-30 DIAGNOSIS — Z1389 Encounter for screening for other disorder: Secondary | ICD-10-CM

## 2018-12-30 DIAGNOSIS — O365930 Maternal care for other known or suspected poor fetal growth, third trimester, not applicable or unspecified: Secondary | ICD-10-CM

## 2018-12-30 DIAGNOSIS — Z331 Pregnant state, incidental: Secondary | ICD-10-CM

## 2018-12-30 LAB — POCT URINALYSIS DIPSTICK OB
Blood, UA: NEGATIVE
GLUCOSE, UA: NEGATIVE
Ketones, UA: NEGATIVE
Leukocytes, UA: NEGATIVE
Nitrite, UA: NEGATIVE
POC,PROTEIN,UA: NEGATIVE

## 2018-12-30 NOTE — Patient Instructions (Addendum)
Selena Barr, I greatly value your feedback.  If you receive a survey following your visit with [place] today, we appreciate you taking the time to fill it out.  Thanks, [name], CNM, WHNP-BC  Your induction is scheduled for Sat 2/1 @ 7am. Go to [facility] hospital, Maternity Admissions Unit (Emergency) entrance and let them know you are there to be induced. They will send someone from Labor & Delivery to come get you.     Call the office [phone]) or go to [facility] if:  You begin to have strong, frequent contractions  Your water breaks.  Sometimes it is a big gush of fluid, sometimes it is just a trickle that keeps getting your panties wet or running down your legs  You have vaginal bleeding.  It is normal to have a small amount of spotting if your cervix was checked.   You don't feel your baby moving like normal.  If you don't, get you something to eat and drink and lay down and focus on feeling your baby move.  You should feel at least 10 movements in 2 hours.  If you don't, you should call the office or go to [facility].     [facility] Contractions Contractions of the uterus can occur throughout pregnancy, but they are not always a sign that you are in labor. You may have practice contractions called Braxton Hicks contractions. These false labor contractions are sometimes confused with true labor. What are Deberah Pelton contractions? Braxton Hicks contractions are tightening movements that occur in the muscles of the uterus before labor. Unlike true labor contractions, these contractions do not result in opening (dilation) and thinning of the cervix. Toward the end of pregnancy (32-34 weeks), Braxton Hicks contractions can happen more often and may become stronger. These contractions are sometimes difficult to tell apart from true labor because they can be very uncomfortable. You should not feel embarrassed if you go to the hospital with false labor. Sometimes, the only way  to tell if you are in true labor is for your health care provider to look for changes in the cervix. The health care provider will do a physical exam and may monitor your contractions. If you are not in true labor, the exam should show that your cervix is not dilating and your water has not broken. If there are no other health problems associated with your pregnancy, it is completely safe for you to be sent home with false labor. You may continue to have Braxton Hicks contractions until you go into true labor. How to tell the difference between true labor and false labor True labor  Contractions last 30-70 seconds.  Contractions become very regular.  Discomfort is usually felt in the top of the uterus, and it spreads to the lower abdomen and low back.  Contractions do not go away with walking.  Contractions usually become more intense and increase in frequency.  The cervix dilates and gets thinner. False labor  Contractions are usually shorter and not as strong as true labor contractions.  Contractions are usually irregular.  Contractions are often felt in the front of the lower abdomen and in the groin.  Contractions may go away when you walk around or change positions while lying down.  Contractions get weaker and are shorter-lasting as time goes on.  The cervix usually does not dilate or become thin. Follow these instructions at home:   Take over-the-counter and prescription medicines only as told by your health care  provider.  Keep up with your usual exercises and follow other instructions from your health care provider.  Eat and drink lightly if you think you are going into labor.  If Braxton Hicks contractions are making you uncomfortable: ? Change your position from lying down or resting to walking, or change from walking to resting. ? Sit and rest in a tub of warm water. ? Drink enough fluid to keep your urine pale yellow. Dehydration may cause these  contractions. ? Do slow and deep breathing several times an hour.  Keep all follow-up prenatal visits as told by your health care provider. This is important. Contact a health care provider if:  You have a fever.  You have continuous pain in your abdomen. Get help right away if:  Your contractions become stronger, more regular, and closer together.  You have fluid leaking or gushing from your vagina.  You pass blood-tinged mucus (bloody show).  You have bleeding from your vagina.  You have low back pain that you never had before.  You feel your baby's head pushing down and causing pelvic pressure.  Your baby is not moving inside you as much as it used to. Summary  Contractions that occur before labor are called Braxton Hicks contractions, false labor, or practice contractions.  Braxton Hicks contractions are usually shorter, weaker, farther apart, and less regular than true labor contractions. True labor contractions usually become progressively stronger and regular, and they become more frequent.  Manage discomfort from [facility] contractions by changing position, resting in a warm bath, drinking plenty of water, or practicing deep breathing. This information is not intended to replace advice given to you by your health care provider. Make sure you discuss any questions you have with your health care provider. Document Released: 04/05/2017 Document Revised: 09/04/2017 Document Reviewed: 04/05/2017 Elsevier Interactive Patient Education  2019 [employer]

## 2018-12-30 NOTE — Progress Notes (Signed)
   HIGH-RISK PREGNANCY VISIT Patient name: Selena Barr MRN [id number]  Date of birth: 1991-03-27 Chief Complaint:   High Risk Gestation (NST)  History of Present Illness:   Selena Barr is a 28 y.o. G58P2001 female at [redacted]w[redacted]d with an Estimated Date of Delivery: 01/11/19 being seen today for ongoing management of a high-risk pregnancy complicated by T2DM, FGR 6.9% @ 36wks; Metformin 1,000mg  BID.  Today she reports all FBS <95, only 1 2hr >120 (124). Contractions: Not present.  .  Movement: Present. denies leaking of fluid.  Review of Systems:   Pertinent items are noted in HPI Denies abnormal vaginal discharge w/ itching/odor/irritation, headaches, visual changes, shortness of breath, chest pain, abdominal pain, severe nausea/vomiting, or problems with urination or bowel movements unless otherwise stated above. Pertinent History Reviewed:  Reviewed past medical,surgical, social, obstetrical and family history.  Reviewed problem list, medications and allergies. Physical Assessment:   Vitals:   12/30/18 1025  BP: 118/74  Pulse: 92  Weight: 296 lb 3.2 oz (134.4 kg)  Body mass index is 52.47 kg/m.           Physical Examination:   General appearance: alert, well appearing, and in no distress  Mental status: alert, oriented to person, place, and time  Skin: warm & dry   Extremities: Edema: None    Cardiovascular: normal heart rate noted  Respiratory: normal respiratory effort, no distress  Abdomen: gravid, soft, non-tender  Pelvic: Cervical exam deferred         Fetal Status: Fetal Heart Rate (bpm): 135   Movement: Present    Fetal Surveillance Testing today: NST: FHR baseline 125 bpm, Variability: moderate, Accelerations:present, Decelerations:  Absent= Cat 1/Reactive Toco: none     Results for orders placed or performed in visit on 12/30/18 (from the past 24 hour(s))  POC Urinalysis Dipstick OB   Collection Time: 12/30/18 10:32 AM  Result Value Ref Range   Color, UA     Clarity, UA     Glucose, UA Negative Negative   Bilirubin, UA     Ketones, UA neg    Spec Grav, UA     Blood, UA neg    pH, UA     POC,PROTEIN,UA Negative Negative, Trace, Small (1+), Moderate (2+), Large (3+), 4+   Urobilinogen, UA     Nitrite, UA neg    Leukocytes, UA Negative Negative   Appearance     Odor      Assessment & Plan:  1) High-risk pregnancy G3P2001 at [redacted]w[redacted]d with an Estimated Date of Delivery: 01/11/19   2) T2DM, stable, on metformin 1,000mg  BID  3) FGR, 6.9% @ 36wks  Meds: No orders of the defined types were placed in this encounter.  Labs/procedures today: nst  Treatment Plan:  Bpp/dopp Thurs, IOL Sat 2/1 @ 0700- scheduled today.  IOL form faxed via Epic and orders placed   Reviewed: Term labor symptoms and general obstetric precautions including but not limited to vaginal bleeding, contractions, leaking of fluid and fetal movement were reviewed in detail with the patient.  All questions were answered.  Follow-up: Return for As scheduled thurs bpp/hrob; cancel rest of appts.  Orders Placed This Encounter  Procedures  . US FETAL BPP WO NON STRESS  . [place] UA Cord Doppler  . POC Urinalysis Dipstick OB   Cheral Marker CNM, [facility] 12/30/2018 12:33 PM

## 2018-12-30 NOTE — Treatment Plan (Signed)
Induction Assessment Scheduling Form Fax to [facility]:  [phone]  Selena Mingleiffany C Barr                                                                                   DOB:  30-Sep-1991                                                            MRN:  [id number]                                                                     Phone #:         [phone]               Provider:  [facility]  GP:  [id number]                                                            Estimated Date of Delivery: 01/11/19  Dating Criteria: LMP c/w 7wk u/s    Medical Indications for induction:  T2DM, FGR Admission Date/Time:  2/1 @ 0700 Gestational age on admission:  39.0   Filed Weights   12/30/18 1025  Weight: 296 lb 3.2 oz (134.4 kg)   HIV:  Non Reactive (08/20 1050) GBS:  neg  Method of induction(proposed):  cytotec   Scheduling Provider Signature:  [name], CNM                                            Today's Date:  12/30/2018

## 2018-12-31 ENCOUNTER — Encounter (HOSPITAL_COMMUNITY): Payer: Self-pay | Admitting: *Deleted

## 2018-12-31 ENCOUNTER — Telehealth (HOSPITAL_COMMUNITY): Payer: Self-pay | Admitting: *Deleted

## 2018-12-31 NOTE — Telephone Encounter (Signed)
Preadmission screen  

## 2019-01-02 ENCOUNTER — Ambulatory Visit (INDEPENDENT_AMBULATORY_CARE_PROVIDER_SITE_OTHER): Payer: Medicaid Other | Admitting: Obstetrics and Gynecology

## 2019-01-02 ENCOUNTER — Encounter: Payer: Self-pay | Admitting: Obstetrics and Gynecology

## 2019-01-02 ENCOUNTER — Ambulatory Visit (INDEPENDENT_AMBULATORY_CARE_PROVIDER_SITE_OTHER): Payer: Medicaid Other

## 2019-01-02 ENCOUNTER — Other Ambulatory Visit: Payer: Self-pay

## 2019-01-02 VITALS — BP 129/7 | HR 98 | Wt 293.0 lb

## 2019-01-02 DIAGNOSIS — Z331 Pregnant state, incidental: Secondary | ICD-10-CM

## 2019-01-02 DIAGNOSIS — O0990 Supervision of high risk pregnancy, unspecified, unspecified trimester: Secondary | ICD-10-CM

## 2019-01-02 DIAGNOSIS — O365930 Maternal care for other known or suspected poor fetal growth, third trimester, not applicable or unspecified: Secondary | ICD-10-CM | POA: Diagnosis not present

## 2019-01-02 DIAGNOSIS — O24113 Pre-existing diabetes mellitus, type 2, in pregnancy, third trimester: Secondary | ICD-10-CM

## 2019-01-02 DIAGNOSIS — O0993 Supervision of high risk pregnancy, unspecified, third trimester: Secondary | ICD-10-CM

## 2019-01-02 DIAGNOSIS — Z1389 Encounter for screening for other disorder: Secondary | ICD-10-CM

## 2019-01-02 DIAGNOSIS — Z3A38 38 weeks gestation of pregnancy: Secondary | ICD-10-CM | POA: Diagnosis not present

## 2019-01-02 DIAGNOSIS — O9989 Other specified diseases and conditions complicating pregnancy, childbirth and the puerperium: Secondary | ICD-10-CM | POA: Diagnosis not present

## 2019-01-02 LAB — POCT URINALYSIS DIPSTICK OB
Blood, UA: NEGATIVE
Glucose, UA: NEGATIVE
Ketones, UA: NEGATIVE
Leukocytes, UA: NEGATIVE
NITRITE UA: NEGATIVE
POC,PROTEIN,UA: NEGATIVE

## 2019-01-02 NOTE — Progress Notes (Addendum)
Patient ID: EBECCA CAVASOS, female   DOB: 1991/10/08, 28 y.o.   MRN: [id number]    [facility] PREGNANCY VISIT Patient name: NIKI VERSER MRN [id number]  Date of birth: 20-Jul-1991 Chief Complaint:   High Risk Gestation (u/s today)  History of Present Illness:   YESSIKA KRAUSHAAR is a 28 y.o. G53P2001 female at [redacted]w[redacted]d with an Estimated Date of Delivery: 01/11/19 being seen today for ongoing management of a high-risk pregnancy complicated by T2DM, FGR 6.9% @ 36wks; Metformin 1,000mg  BID. Today she reports no complaints. This is her third child and the others were delivered vaginally. She signed the BTL papers but has now decided to use Nexplanon for 3 years,, which she has used before. Her fasting blood sugar today was 87 and 101 after breakfast.   Contractions: Not present. Vag. Bleeding: None.  Movement: Present. denies leaking of fluid.  Review of Systems:   Pertinent items are noted in HPI Denies abnormal vaginal discharge w/ itching/odor/irritation, headaches, visual changes, shortness of breath, chest pain, abdominal pain, severe nausea/vomiting, or problems with urination or bowel movements unless otherwise stated above. Pertinent History Reviewed:  Reviewed past medical,surgical, social, obstetrical and family history.  Reviewed problem list, medications and allergies. Physical Assessment:   Vitals:   01/02/19 1230  BP: (!) 129/7  Pulse: 98  Weight: 293 lb (132.9 kg)  Body mass index is 51.9 kg/m.           Physical Examination:   General appearance: alert, well appearing, and in no distress and oriented to person, place, and time  Mental status: alert, oriented to person, place, and time, normal mood, behavior, speech, dress, motor activity, and thought processes, affect appropriate to mood  Skin: warm & dry   Extremities: Edema: None    Cardiovascular: normal heart rate noted  Respiratory: normal respiratory effort, no distress  Abdomen: gravid, soft, non-tender  Pelvic:  Cervical exam performed Dilation: Closed                               Cervix: Long closed posterior Fetal Status: Fetal Heart Rate (bpm): 161 Fundal Height: 41 cm Movement: Present    Fetal Surveillance Testing today: [place] 38+5 wks,cephalic,BPP 8/8,posterior pl gr 3,normal ovaries bilat,afi 14 cm,fhr 161 bpm,RI .47,.43,.52=21%  Results for orders placed or performed in visit on 01/02/19 (from the past 24 hour(s))  POC Urinalysis Dipstick OB   Collection Time: 01/02/19 12:30 PM  Result Value Ref Range   Color, UA     Clarity, UA     Glucose, UA Negative Negative   Bilirubin, UA     Ketones, UA neg    Spec Grav, UA     Blood, UA neg    pH, UA     POC,PROTEIN,UA Negative Negative, Trace, Small (1+), Moderate (2+), Large (3+), 4+   Urobilinogen, UA     Nitrite, UA neg    Leukocytes, UA Negative Negative   Appearance     Odor      Assessment & Plan:  1) High-risk pregnancy T2DM G3P2001 at [redacted]w[redacted]d with an Estimated Date of Delivery: 01/11/19   2) T2DM, stable with Metformin 1000mg  BID, BPP 8/8  3) FGR, 6.9% @ 36wks  4) Contraception Management -- BTL papers signed, but has decided to use Nexplanon instead  Meds: No orders of the defined types were placed in this encounter.  Labs/procedures today:U S 38+5 wks,cephalic,BPP 8/8,posterior pl gr 3,normal ovaries bilat,afi  14 cm,fhr 161 bpm,RI .47,.43,.52=21%,S/D 21%  Treatment Plan:  IOL scheduled for Saturday, 14-Jan-2019  Reviewed: Term labor symptoms and general obstetric precautions including but not limited to vaginal bleeding, contractions, leaking of fluid and fetal movement were reviewed in detail with the patient.  All questions were answered.  Follow-up: Return in about 5 weeks (around 02/06/2019) for Postpartum chk.  Orders Placed This Encounter  Procedures  . POC Urinalysis Dipstick OB   By signing my name below, I, Pietro Cassis, attest that this documentation has been prepared under the direction and in the presence of  Tilda Burrow, MD. Electronically Signed: Pietro Cassis, Medical Scribe. 01/02/19. 12:54 PM.  I personally performed the services described in this documentation, which was SCRIBED in my presence. The recorded information has been reviewed and considered accurate. It has been edited as necessary during review. Tilda Burrow, MD

## 2019-01-02 NOTE — Progress Notes (Signed)
[place] 38+5 wks,cephalic,BPP 8/8,posterior pl gr 3,normal ovaries bilat,afi 14 cm,fhr 161 bpm,RI .47,.43,.52=21%

## 2019-01-04 ENCOUNTER — Inpatient Hospital Stay (HOSPITAL_COMMUNITY)
Admission: RE | Admit: 2019-01-04 | Discharge: 2019-01-07 | DRG: 807 | Disposition: A | Discharge status: Home / Self Care | Payer: Medicaid Other | Attending: Obstetrics & Gynecology | Admitting: Obstetrics & Gynecology

## 2019-01-04 ENCOUNTER — Encounter (HOSPITAL_COMMUNITY): Payer: Self-pay

## 2019-01-04 ENCOUNTER — Other Ambulatory Visit: Payer: Self-pay

## 2019-01-04 DIAGNOSIS — O0990 Supervision of high risk pregnancy, unspecified, unspecified trimester: Secondary | ICD-10-CM

## 2019-01-04 DIAGNOSIS — O99334 Smoking (tobacco) complicating childbirth: Secondary | ICD-10-CM | POA: Diagnosis present

## 2019-01-04 DIAGNOSIS — Z2839 Other underimmunization status: Secondary | ICD-10-CM

## 2019-01-04 DIAGNOSIS — Z283 Underimmunization status: Secondary | ICD-10-CM

## 2019-01-04 DIAGNOSIS — O99214 Obesity complicating childbirth: Secondary | ICD-10-CM | POA: Diagnosis present

## 2019-01-04 DIAGNOSIS — E119 Type 2 diabetes mellitus without complications: Secondary | ICD-10-CM | POA: Diagnosis present

## 2019-01-04 DIAGNOSIS — O365990 Maternal care for other known or suspected poor fetal growth, unspecified trimester, not applicable or unspecified: Secondary | ICD-10-CM | POA: Diagnosis present

## 2019-01-04 DIAGNOSIS — O2412 Pre-existing diabetes mellitus, type 2, in childbirth: Principal | ICD-10-CM | POA: Diagnosis present

## 2019-01-04 DIAGNOSIS — O365930 Maternal care for other known or suspected poor fetal growth, third trimester, not applicable or unspecified: Secondary | ICD-10-CM | POA: Diagnosis present

## 2019-01-04 DIAGNOSIS — O9989 Other specified diseases and conditions complicating pregnancy, childbirth and the puerperium: Secondary | ICD-10-CM

## 2019-01-04 DIAGNOSIS — F17210 Nicotine dependence, cigarettes, uncomplicated: Secondary | ICD-10-CM | POA: Diagnosis present

## 2019-01-04 DIAGNOSIS — Z7984 Long term (current) use of oral hypoglycemic drugs: Secondary | ICD-10-CM

## 2019-01-04 DIAGNOSIS — Z3A39 39 weeks gestation of pregnancy: Secondary | ICD-10-CM

## 2019-01-04 DIAGNOSIS — Z23 Encounter for immunization: Secondary | ICD-10-CM

## 2019-01-04 DIAGNOSIS — O99213 Obesity complicating pregnancy, third trimester: Secondary | ICD-10-CM | POA: Diagnosis present

## 2019-01-04 LAB — CBC
HCT: 35.6 % — ABNORMAL LOW (ref 36.0–46.0)
Hemoglobin: 12.2 g/dL (ref 12.0–15.0)
MCH: 29.3 pg (ref 26.0–34.0)
MCHC: 34.3 g/dL (ref 30.0–36.0)
MCV: 85.6 fL (ref 80.0–100.0)
Platelets: 357 10*3/uL (ref 150–400)
RBC: 4.16 MIL/uL (ref 3.87–5.11)
RDW: 14 % (ref 11.5–15.5)
WBC: 11.3 10*3/uL — ABNORMAL HIGH (ref 4.0–10.5)
nRBC: 0 % (ref 0.0–0.2)

## 2019-01-04 LAB — ABO/RH: ABO/RH(D): B POS

## 2019-01-04 LAB — TYPE AND SCREEN
ABO/RH(D): B POS
Antibody Screen: NEGATIVE

## 2019-01-04 LAB — GLUCOSE, CAPILLARY
GLUCOSE-CAPILLARY: 113 mg/dL — AB (ref 70–99)
Glucose-Capillary: 113 mg/dL — ABNORMAL HIGH (ref 70–99)
Glucose-Capillary: 87 mg/dL (ref 70–99)
Glucose-Capillary: 86 mg/dL (ref 70–99)

## 2019-01-04 MED ORDER — PHENYLEPHRINE 40 MCG/ML (10ML) SYRINGE FOR IV PUSH (FOR BLOOD PRESSURE SUPPORT)
80.0000 ug | PREFILLED_SYRINGE | INTRAVENOUS | Status: DC | PRN
  Filled 2019-01-04 (×2): qty 10

## 2019-01-04 MED ORDER — FENTANYL CITRATE (PF) 100 MCG/2ML IJ SOLN: 50.0000 ug | INTRAMUSCULAR | Status: DC | PRN

## 2019-01-04 MED ORDER — LACTATED RINGERS IV SOLN: 500.0000 mL | INTRAVENOUS | Status: DC | PRN

## 2019-01-04 MED ORDER — OXYCODONE-ACETAMINOPHEN 5-325 MG PO TABS: 1.0000 | ORAL_TABLET | ORAL | Status: DC | PRN

## 2019-01-04 MED ORDER — OXYTOCIN BOLUS FROM INFUSION
500.0000 mL | Freq: Once | INTRAVENOUS | Status: AC
  Administered 2019-01-05: 500 mL via INTRAVENOUS

## 2019-01-04 MED ORDER — PHENYLEPHRINE 40 MCG/ML (10ML) SYRINGE FOR IV PUSH (FOR BLOOD PRESSURE SUPPORT)
80.0000 ug | PREFILLED_SYRINGE | INTRAVENOUS | Status: DC | PRN
  Administered 2019-01-05 (×2): 80 ug via INTRAVENOUS
  Filled 2019-01-04: qty 10

## 2019-01-04 MED ORDER — EPHEDRINE 5 MG/ML INJ
10.0000 mg | INTRAVENOUS | Status: DC | PRN
  Filled 2019-01-04: qty 2

## 2019-01-04 MED ORDER — ACETAMINOPHEN 325 MG PO TABS: 650.0000 mg | ORAL_TABLET | ORAL | Status: DC | PRN

## 2019-01-04 MED ORDER — HYDROXYZINE HCL 50 MG PO TABS
50.0000 mg | ORAL_TABLET | Freq: Four times a day (QID) | ORAL | Status: DC | PRN
  Filled 2019-01-04: qty 1

## 2019-01-04 MED ORDER — OXYTOCIN 40 UNITS IN NORMAL SALINE INFUSION - SIMPLE MED
2.5000 [IU]/h | INTRAVENOUS | Status: DC
  Filled 2019-01-04: qty 1000

## 2019-01-04 MED ORDER — SOD CITRATE-CITRIC ACID 500-334 MG/5ML PO SOLN: 30.0000 mL | ORAL | Status: DC | PRN

## 2019-01-04 MED ORDER — OXYCODONE-ACETAMINOPHEN 5-325 MG PO TABS: 2.0000 | ORAL_TABLET | ORAL | Status: DC | PRN

## 2019-01-04 MED ORDER — OXYTOCIN 40 UNITS IN NORMAL SALINE INFUSION - SIMPLE MED
1.0000 m[IU]/min | INTRAVENOUS | Status: DC
  Administered 2019-01-04: 2 m[IU]/min via INTRAVENOUS

## 2019-01-04 MED ORDER — LACTATED RINGERS IV SOLN
INTRAVENOUS | Status: DC
  Administered 2019-01-04 – 2019-01-05 (×5): via INTRAVENOUS

## 2019-01-04 MED ORDER — FLEET ENEMA 7-19 GM/118ML RE ENEM: 1.0000 | ENEMA | RECTAL | Status: DC | PRN

## 2019-01-04 MED ORDER — TERBUTALINE SULFATE 1 MG/ML IJ SOLN
0.2500 mg | Freq: Once | INTRAMUSCULAR | Status: DC | PRN
  Filled 2019-01-04: qty 1

## 2019-01-04 MED ORDER — LIDOCAINE HCL (PF) 1 % IJ SOLN
30.0000 mL | INTRAMUSCULAR | Status: DC | PRN
  Filled 2019-01-04: qty 30

## 2019-01-04 MED ORDER — LACTATED RINGERS IV SOLN
500.0000 mL | Freq: Once | INTRAVENOUS | Status: AC
  Administered 2019-01-04: 500 mL via INTRAVENOUS

## 2019-01-04 MED ORDER — METFORMIN HCL 500 MG PO TABS
1000.0000 mg | ORAL_TABLET | Freq: Two times a day (BID) | ORAL | Status: DC
  Administered 2019-01-04 (×2): 1000 mg via ORAL
  Filled 2019-01-04 (×3): qty 2

## 2019-01-04 MED ORDER — DIPHENHYDRAMINE HCL 50 MG/ML IJ SOLN: 12.5000 mg | INTRAMUSCULAR | Status: DC | PRN

## 2019-01-04 MED ORDER — FENTANYL 2.5 MCG/ML BUPIVACAINE 1/10 % EPIDURAL INFUSION (WH - ANES)
14.0000 mL/h | INTRAMUSCULAR | Status: DC | PRN
  Administered 2019-01-05: 14 mL/h via EPIDURAL
  Filled 2019-01-04 (×2): qty 100

## 2019-01-04 MED ORDER — TERBUTALINE SULFATE 1 MG/ML IJ SOLN: 0.2500 mg | Freq: Once | INTRAMUSCULAR | Status: DC | PRN

## 2019-01-04 MED ORDER — ONDANSETRON HCL 4 MG/2ML IJ SOLN
4.0000 mg | Freq: Four times a day (QID) | INTRAMUSCULAR | Status: DC | PRN
  Administered 2019-01-05: 4 mg via INTRAVENOUS
  Filled 2019-01-04: qty 2

## 2019-01-04 MED ORDER — MISOPROSTOL 25 MCG QUARTER TABLET
25.0000 ug | ORAL_TABLET | ORAL | Status: DC | PRN
  Administered 2019-01-04 (×3): 25 ug via VAGINAL
  Filled 2019-01-04 (×3): qty 1

## 2019-01-04 NOTE — Progress Notes (Signed)
Selena Barr is a 28 y.o. G3P2001 at 2746w0d admitted for IOL for T2DM.   Subjective: Patient denies contractions or pain of any kind Plans epidural for pain management when in active labor Requesting shower Eating breakfast of pancakes with syrup   Objective: BP (!) 144/91 (BP Location: Left Arm)   Pulse (!) 108   Temp 98.2 F (36.8 C) (Oral)   Resp 18   Ht 5\' 4"  (1.626 m)   Wt 135.1 kg   LMP 04/06/2018   SpO2 99%   BMI 51.12 kg/m  No intake/output data recorded. No intake/output data recorded.  FHT:  FHR: 140 bpm, variability: moderate,  accelerations:  Present,  decelerations:  Absent UC:   none SVE:   Dilation: Closed Effacement (%): Thick Station: -2 Exam by:: Selena Barr, RNC  Labs: Lab Results  Component Value Date   WBC 11.3 (H) 01/19/2019   HGB 12.2 01/22/2019   HCT 35.6 (L) 01/30/2019   MCV 85.6 01/27/2019   PLT 357 01/31/2019    Assessment / Plan: Closed cervix S/p Cytotec # 1 (25 mcg pv) at 0818 Category I tracing FGR 7% at 36 weeks with normal dopplers CBG 113 at 0745, q 4 hour until active labor then q 1-2 hours Plan foley bulb + Cytotec at next check as indicated by cervical change Bridge to Pitocin when foley bulb is dislodged Epidural in active labor Anticipate SVD   Calvert CantorSamantha C Zacary Bauer, CNM 01/14/2019, 9:36 AM

## 2019-01-04 NOTE — Progress Notes (Signed)
Selena Barr is a 28 y.o. G3P2001 at [redacted]w[redacted]d admitted for induction of labor due to T2DM.  Subjective: Patient denies complaints  Objective: BP (!) 121/59   Pulse 80   Temp 98.3 F (36.8 C) (Oral)   Resp 18   Ht 5\' 4"  (1.626 m)   Wt 135.1 kg   LMP 04/06/2018   SpO2 99%   BMI 51.12 kg/m  No intake/output data recorded. No intake/output data recorded.  FHT:  FHR: 135 bpm, variability: moderate,  accelerations:  Present,  decelerations:  Absent UC:  Rare, not felt by patient  SVE:   Dilation: tight 2cm Effacement (%): Thick Station: -2 Exam by:: Sam, CNM  Labs: Lab Results  Component Value Date   WBC 11.3 (H) 01-30-2019   HGB 12.2 01-30-2019   HCT 35.6 (L) Jan 30, 2019   MCV 85.6 01/30/19   PLT 357 2019/01/30    Assessment / Plan: Category I tracing Cytotec#3 plus foley bulb placed, placement tolerated very well by patient Plan for Pitocin when foley bulb is dislodged Patient plans epidural when in active labor Anticipate NSVD  Calvert Cantor, CNM 2019/01/30, 4:13 PM

## 2019-01-04 NOTE — Anesthesia Pain Management Evaluation Note (Signed)
  CRNA Pain Management Visit Note  Patient: Selena Barr, 28 y.o., female  "Hello I am a member of the anesthesia team at [facility]. We have an anesthesia team available at all times to provide care throughout the hospital, including epidural management and anesthesia for C-section. I don't know your plan for the delivery whether it a natural birth, water birth, IV sedation, nitrous supplementation, doula or epidural, but we want to meet your pain goals."   1.Was your pain managed to your expectations on prior hospitalizations?   Yes   2.What is your expectation for pain management during this hospitalization?     Epidural  3.How can we help you reach that goal? epidural  Record the patient's initial score and the patient's pain goal.   Pain: 5  Pain Goal: 8 [facility] wants you to be able to say your pain was always managed very well.  Rica Records 01-15-19

## 2019-01-04 NOTE — H&P (Signed)
Selena Barr is a 28 y.o. female G3P2001 @ 39.0wks by LMP and confirmed with 7wk scan presenting for IOL due to T2DM (metformin 1000 bid). Denies leaking or bldg; no H/A, visual disturbances or RUQ pain. Her preg has been followed by the [facility] service and has been remarkable for 1) T2DM 2) 1st child deceased from shaken baby syndrome 3) FGR 6.9% dx at 36wks, nl dopplers 4) smoker 5) BMI 45 6) rubella nonimmune  OB History    Gravida  3   Para  2   Term  2   Preterm      AB      Living  1     SAB      TAB      Ectopic      Multiple      Live Births  2          Past Medical History:  Diagnosis Date  . Allergy   . Diabetes mellitus without complication ([place])   . Hyperlipidemia 10/24/2016  . Tobacco abuse 09/22/2016   Past Surgical History:  Procedure Laterality Date  . WISDOM TOOTH EXTRACTION     Family History: family history includes Alcohol abuse in her mother; COPD in her mother; Cancer in her maternal grandfather and maternal grandmother; Diabetes in her maternal grandfather and mother; Drug abuse in her mother. Social History:  reports that she has been smoking cigarettes. She has a 0.50 pack-year smoking history. She has never used smokeless tobacco. She reports that she does not drink alcohol or use drugs.     Maternal Diabetes: Yes:  Diabetes Type:  Pre-pregnancy Genetic Screening: Normal Maternal Ultrasounds/Referrals: Abnormal:  Findings:   IUGR Fetal Ultrasounds or other Referrals:  None Maternal Substance Abuse:  Yes:  Type: Smoker Significant Maternal Medications:  Meds include: Other: Metformin Significant Maternal Lab Results:  Lab values include: Group B Strep negative Other Comments:  None  ROS History Dilation: Closed Effacement (%): Thick Station: -2 Exam by:: Glade Nurse, RNC Blood pressure (!) 144/91, pulse (!) 108, temperature 98.2 F (36.8 C), temperature source Oral, resp. rate 18, height 5' 4"  (1.626 m), weight 135.1 kg,  last menstrual period 04/06/2018, SpO2 99 %. Exam Physical Exam  Constitutional: She is oriented to person, place, and time. She appears well-developed.  HENT:  Head: Normocephalic.  Neck: Normal range of motion.  Cardiovascular: Normal rate.  Respiratory: Effort normal.  Musculoskeletal: Normal range of motion.  Neurological: She is alert and oriented to person, place, and time.  Skin: Skin is warm and dry.  Psychiatric: She has a normal mood and affect. Her behavior is normal. Thought content normal.    Prenatal labs: ABO, Rh: --/--/B POS (02/01 0511) Antibody: PENDING (02/01 0211) Rubella: <0.90 (08/20 1050) RPR: Non Reactive (08/20 1050)  HBsAg: Negative (08/20 1050)  HIV: Non Reactive (08/20 1050)  GBS:   negative 12/16/18  Assessment/Plan: IUP@39 .0 T2DM FGR GBS neg Cx unfavorable  -Admit to [facility] -Single elevated BP- evaluate additional BPs once pt is settled to determine need for lab f/u -Plan on cytotec/cervical foley for ripening, then progress to -Pit/AROM as able -CBGs q 4h -Carb mod diet while ripening; continued home Metformin dosing -Anticipate SVD -Recommend MMR vax pp   Myrtis Ser CNM 01/12/2019, 8:58 AM

## 2019-01-04 NOTE — Progress Notes (Signed)
Selena Barr is a 28 y.o. G3P2001 admitted for induction of labor due to T2DM.  Subjective: No complaints, feeling positive  Objective: BP 128/70   Pulse 80   Temp 98.3 F (36.8 C) (Oral)   Resp 18   Ht 5\' 4"  (1.626 m)   Wt 135.1 kg   LMP 04/06/2018   SpO2 99%   BMI 51.12 kg/m  No intake/output data recorded. No intake/output data recorded.  FHT:  FHR: 135 bpm, variability: moderate,  accelerations:  Present,  decelerations:  Absent UC:   none SVE:   Dilation: Closed Effacement (%): Thick Station: -2 Exam by:: Sam, CNM  Labs: Lab Results  Component Value Date   WBC 11.3 (H) 01/15/2019   HGB 12.2 01/31/2019   HCT 35.6 (L) 01/17/2019   MCV 85.6 01/11/2019   PLT 357 01/23/2019    Assessment / Plan: Category 1 tracing Cervix slightly softer, remains closed/thick S/P Cytotec #1 at 0818 (vaginal) Place Cytotec #2 50 mcg buccal now Reassess for foley bulb or Pitocin in 4 hours   Calvert Cantor, CNM 01/23/2019, 12:17 PM

## 2019-01-05 ENCOUNTER — Inpatient Hospital Stay (HOSPITAL_COMMUNITY): Payer: Medicaid Other | Admitting: Anesthesiology

## 2019-01-05 DIAGNOSIS — O2412 Pre-existing diabetes mellitus, type 2, in childbirth: Secondary | ICD-10-CM

## 2019-01-05 DIAGNOSIS — Z3A39 39 weeks gestation of pregnancy: Secondary | ICD-10-CM

## 2019-01-05 LAB — RPR: RPR Ser Ql: NONREACTIVE

## 2019-01-05 LAB — GLUCOSE, CAPILLARY
Glucose-Capillary: 79 mg/dL (ref 70–99)
Glucose-Capillary: 76 mg/dL (ref 70–99)
Glucose-Capillary: 64 mg/dL — ABNORMAL LOW (ref 70–99)
Glucose-Capillary: 67 mg/dL — ABNORMAL LOW (ref 70–99)
Glucose-Capillary: 102 mg/dL — ABNORMAL HIGH (ref 70–99)

## 2019-01-05 MED ORDER — LIDOCAINE HCL (PF) 1 % IJ SOLN
INTRAMUSCULAR | Status: DC | PRN
  Administered 2019-01-05: 5 mL via EPIDURAL
  Administered 2019-01-05: 7 mL via EPIDURAL

## 2019-01-05 MED ORDER — MISOPROSTOL 50MCG HALF TABLET
50.0000 ug | ORAL_TABLET | Freq: Once | ORAL | Status: AC
  Administered 2019-01-05: 50 ug via BUCCAL
  Filled 2019-01-05: qty 1

## 2019-01-05 MED ORDER — METHYLERGONOVINE MALEATE 0.2 MG/ML IJ SOLN
INTRAMUSCULAR | Status: AC
  Administered 2019-01-05: 0.2 mg
  Filled 2019-01-05: qty 1

## 2019-01-05 MED ORDER — ONDANSETRON HCL 4 MG PO TABS: 4.0000 mg | ORAL_TABLET | ORAL | Status: DC | PRN

## 2019-01-05 MED ORDER — DIBUCAINE 1 % RE OINT: 1.0000 "application " | TOPICAL_OINTMENT | RECTAL | Status: DC | PRN

## 2019-01-05 MED ORDER — SIMETHICONE 80 MG PO CHEW: 80.0000 mg | CHEWABLE_TABLET | ORAL | Status: DC | PRN

## 2019-01-05 MED ORDER — BENZOCAINE-MENTHOL 20-0.5 % EX AERO
1.0000 "application " | INHALATION_SPRAY | CUTANEOUS | Status: DC | PRN
  Administered 2019-01-06: 1 via TOPICAL
  Filled 2019-01-05: qty 56

## 2019-01-05 MED ORDER — COCONUT OIL OIL: 1.0000 "application " | TOPICAL_OIL | Status: DC | PRN

## 2019-01-05 MED ORDER — ONDANSETRON HCL 4 MG/2ML IJ SOLN: 4.0000 mg | INTRAMUSCULAR | Status: DC | PRN

## 2019-01-05 MED ORDER — ACETAMINOPHEN 325 MG PO TABS
650.0000 mg | ORAL_TABLET | ORAL | Status: DC | PRN
  Administered 2019-01-06 (×2): 650 mg via ORAL
  Filled 2019-01-05 (×2): qty 2

## 2019-01-05 MED ORDER — IBUPROFEN 600 MG PO TABS
600.0000 mg | ORAL_TABLET | Freq: Four times a day (QID) | ORAL | Status: DC
  Administered 2019-01-05 – 2019-01-07 (×6): 600 mg via ORAL
  Filled 2019-01-05 (×6): qty 1

## 2019-01-05 MED ORDER — MEASLES, MUMPS & RUBELLA VAC IJ SOLR
0.5000 mL | Freq: Once | INTRAMUSCULAR | Status: AC
  Administered 2019-01-07: 0.5 mL via SUBCUTANEOUS
  Filled 2019-01-05 (×2): qty 0.5

## 2019-01-05 MED ORDER — TETANUS-DIPHTH-ACELL PERTUSSIS 5-2.5-18.5 LF-MCG/0.5 IM SUSP: 0.5000 mL | Freq: Once | INTRAMUSCULAR | Status: DC

## 2019-01-05 MED ORDER — WITCH HAZEL-GLYCERIN EX PADS: 1.0000 "application " | MEDICATED_PAD | CUTANEOUS | Status: DC | PRN

## 2019-01-05 MED ORDER — DIPHENHYDRAMINE HCL 25 MG PO CAPS: 25.0000 mg | ORAL_CAPSULE | Freq: Four times a day (QID) | ORAL | Status: DC | PRN

## 2019-01-05 MED ORDER — SENNOSIDES-DOCUSATE SODIUM 8.6-50 MG PO TABS
2.0000 | ORAL_TABLET | ORAL | Status: DC
  Administered 2019-01-05 – 2019-01-06 (×2): 2 via ORAL
  Filled 2019-01-05 (×2): qty 2

## 2019-01-05 MED ORDER — PRENATAL MULTIVITAMIN CH
1.0000 | ORAL_TABLET | Freq: Every day | ORAL | Status: DC
  Administered 2019-01-06: 1 via ORAL
  Filled 2019-01-05: qty 1

## 2019-01-05 MED ORDER — METFORMIN HCL 500 MG PO TABS
1000.0000 mg | ORAL_TABLET | Freq: Two times a day (BID) | ORAL | Status: DC
  Administered 2019-01-06 – 2019-01-07 (×3): 1000 mg via ORAL
  Filled 2019-01-05 (×3): qty 2

## 2019-01-05 NOTE — Progress Notes (Signed)
OB/GYN Faculty Practice: Labor Progress Note  Subjective: Doing well, comfortable with epidural. Partner at bedside. Was able to sleep after epidural.   Objective: BP 124/61   Pulse 74   Temp 98.5 F (36.9 C) (Oral)   Resp 18   Ht 5\' 4"  (1.626 m)   Wt 135.1 kg   LMP 04/06/2018   SpO2 99%   BMI 51.12 kg/m  Gen: well-appearing, NAD Dilation: 5 Effacement (%): 50 Cervical Position: Middle Station: -2 Presentation: Vertex Exam by:: Earlene Plater, L  Assessment and Plan: 28 y.o. G3P2001 107w1d here for IOL for Type 2 Diabetes.   Labor: Induction started with FB and 3 doses of vaginal cytotec then switched to pitocin around 9pm overnight. Pitocin now up to 28 and unable to pick up many contractions on monitor. Cervix 5 but still feels thick so will switch to buccal cytotec for 1 dose. Also able to AROM as head well-applied - clear fluid. IUPC placed to help monitor contractions.  -- pain control: epidural in place -- PPH Risk: medium (BMI 52)  Fetal Well-Being: EFW 7% at 36 weeks - IUGR. Cephalic by sutures.  -- Category I - continuous fetal monitoring  -- GBS negative   Type II DM: Stopped metformin since no longer eating and BG in 60-70s. Still checking CBG q4hrs until active labor. Okay to have clear liquids with some carbs.    Cristal Deer. Earlene Plater, DO OB/GYN Fellow, Faculty Practice  9:30 AM

## 2019-01-05 NOTE — Progress Notes (Signed)
LABOR PROGRESS NOTE  Selena Barr is a 28 y.o. G3P2001 at 3565w1d admitted for IOL for T2DM  Subjective: Feeling comfortable with epidural  Objective: BP (!) 109/49   Pulse 65   Temp 98.6 F (37 C) (Oral)   Resp 16   Ht 5\' 4"  (1.626 m)   Wt 135.1 kg   LMP 04/06/2018   SpO2 97%   BMI 51.12 kg/m  or  Vitals:   01/05/19 0107 01/05/19 0111 01/05/19 0112 01/05/19 0117  BP: (!) 114/54 (!) 110/56 (!) 110/56 (!) 109/49  Pulse: 70 65 65 65  Resp: 16 16 16 16   Temp:      TempSrc:      SpO2:  97% 97%   Weight:      Height:        Dilation: 4 Effacement (%): 70 Cervical Position: Posterior Station: -3 Presentation: Vertex Exam by:: Laney Pastorasey Pastva, RN FHT: baseline rate 130, moderate varibility, + acel, no decel Toco: q2-4645m  Labs: Lab Results  Component Value Date   WBC 11.3 (H) Oct 17, 2019   HGB 12.2 Oct 17, 2019   HCT 35.6 (L) Oct 17, 2019   MCV 85.6 Oct 17, 2019   PLT 357 Oct 17, 2019    Patient Active Problem List   Diagnosis Date Noted  . Pregnancy affected by fetal growth restriction 12/26/2018  . Obesity affecting pregnancy in third trimester, antepartum 10/28/2018  . Rubella non-immune status, antepartum 07/24/2018  . Supervision of high risk pregnancy, antepartum 07/21/2018  . Death of infant 07/10/2018  . Mixed hyperlipidemia 10/24/2016  . Type 2 diabetes mellitus during pregnancy 09/22/2016  . Morbid obesity (HCC) 09/22/2016  . Tobacco abuse 09/22/2016    Assessment / Plan: 28 y.o. G3P2001 at 7165w1d here for IOL for T2DM  Labor: progressing. Will start pitocin if no change with next check Fetal Wellbeing:  Cat 1 Pain Control:  Epidural in place Anticipated MOD:  SVD  Gwenevere AbbotNimeka Granger Chui, MD  OB Fellow  01/05/2019, 1:29 AM

## 2019-01-05 NOTE — Anesthesia Procedure Notes (Signed)
Epidural Patient location during procedure: OB Start time: 01/05/2019 12:18 AM End time: 01/05/2019 12:26 AM  Staffing Anesthesiologist: Leilani AbleHatchett, Aviana Shevlin, MD Performed: anesthesiologist   Preanesthetic Checklist Completed: patient identified, site marked, surgical consent, pre-op evaluation, timeout performed, IV checked, risks and benefits discussed and monitors and equipment checked  Epidural Patient position: sitting Prep: site prepped and draped and DuraPrep Patient monitoring: continuous pulse ox and blood pressure Approach: midline Location: L3-L4 Injection technique: LOR air  Needle:  Needle type: Tuohy  Needle gauge: 17 G Needle length: 9 cm and 9 Needle insertion depth: 8 cm Catheter type: closed end flexible Catheter size: 19 Gauge Catheter at skin depth: 14 cm Test dose: negative and Other  Assessment Sensory level: T9 Events: blood not aspirated, injection not painful, no injection resistance, negative IV test and no paresthesia

## 2019-01-05 NOTE — Progress Notes (Signed)
OB/GYN Faculty Practice: Labor Progress Note  Subjective: Comfortable, was able to get a quick nap.   Objective: BP 122/68   Pulse 84   Temp 99 F (37.2 C) (Oral)   Resp 18   Ht 5\' 4"  (1.626 m)   Wt 135.1 kg   LMP 04/06/2018   SpO2 99%   BMI 51.12 kg/m  Gen: well-appearing, NAD Dilation: 5 Effacement (%): 70 Cervical Position: Middle Station: -2 Presentation: Vertex Exam by:: Marlis Edelson, MD  Assessment and Plan: 28 y.o. G3P2001 [redacted]w[redacted]d here for IOL for Type 2 Diabetes.   Labor: Received dose of cytotec about 4 hours ago along with AROM and IUPC. Minimal MVUs since pitocin stopped. Cervix slightly more effaced. Will restart pitocin.  -- pain control: epidural in place -- PPH Risk: medium (BMI 52)  Fetal Well-Being: EFW 7% at 36 weeks - IUGR. Cephalic by sutures.  -- Category I - continuous fetal monitoring  -- GBS negative   Type II DM: Stopped metformin since no longer eating and BG in 60-70s. Still checking CBG q4hrs until active labor. Okay to have clear liquids with some carbs.    Cristal Deer. Earlene Plater, DO OB/GYN Fellow, Faculty Practice  2:32 PM

## 2019-01-05 NOTE — Lactation Note (Signed)
This note was copied from a baby's chart. Lactation Consultation Note  Patient Name: Selena Barr JOACZ'Y Date: 01/05/2019 Reason for consult: Initial assessment;1st time breastfeeding;Term;Maternal endocrine disorder Type of Endocrine Disorder?: Diabetes(DM prior pregnancy)  5 hours old FT female who is being exclusively BF by his mother, she's a P3. Mom didn't BF her other children, this is her first time BF. She reported (+) breast changes during the pregnancy despite her DM status, and she's been able to get drops of colostrum when [facility] assisted with hand expression prior latching baby on. She also mentioned to [facility] that even though she didn't BF before, her milk came in between 3-4 days after birth. Mom participated in the [facility] program at [facility].  RN called for latch assistance, she was already helping mom when [facility] came in the room. Mom trying cross cradle hold, but baby wasn't deep enough. Repositioned baby, but due to mom's anatomy (she has very large breasts) cross cradle was challenging for her to see what baby was doing. LC tried football hold next and this time; with mom reporting the latch being much better and easier to visualize. A few audible swallows heard upon breast compressions; praised mom for her efforts.   LC asked mom how does she feel about pumping early on and she said she can start pumping tonight. LC set up a DEBP, instructions, cleaning and storage were reviewed, as well as milk storage guidelines. Reviewed normal newborn behavior, cluster feeding and feeding cues.  Feeding plan:  1. Encouraged mom to feed baby STS 8-12 times/24 hours or sooner if feeding cues are present 2. Mom will pump every 3 hours and at least once at night, a minimum of 6 pumping sessions in 24 hours 3. Hand expression and spoon/finger feeding was also encouraged  BF brochure, BF resources and feeding diary were reviewed. Parents reported all questions and concerns were answered, they're  both aware of LC services and will call PRN.  Maternal Data Formula Feeding for Exclusion: No Has patient been taught Hand Expression?: Yes Does the patient have breastfeeding experience prior to this delivery?: No(Didn't BF her other two kids)  Feeding Feeding Type: Breast Fed  LATCH Score Latch: Repeated attempts needed to sustain latch, nipple held in mouth throughout feeding, stimulation needed to elicit sucking reflex.  Audible Swallowing: A few with stimulation  Type of Nipple: Everted at rest and after stimulation  Comfort (Breast/Nipple): Soft / non-tender  Hold (Positioning): Assistance needed to correctly position infant at breast and maintain latch.  LATCH Score: 7  Interventions Interventions: Breast feeding basics reviewed;Assisted with latch;Skin to skin;Breast massage;Hand express;Breast compression;Adjust position;Support pillows;Position options;DEBP  Lactation Tools Discussed/Used Tools: Pump Breast pump type: Double-Electric Breast Pump WIC Program: Yes Pump Review: Setup, frequency, and cleaning Initiated by:: MPeck Date initiated:: 01/05/19   Consult Status Consult Status: Follow-up Date: 01/06/19 Follow-up type: In-patient    Selena Barr 01/05/2019, 10:19 PM

## 2019-01-05 NOTE — Anesthesia Preprocedure Evaluation (Signed)
Anesthesia Evaluation  Patient identified by MRN, date of birth, ID band Patient awake    Reviewed: Allergy & Precautions, H&P , NPO status , Patient's Chart, lab work & pertinent test results  Airway Mallampati: III  TM Distance: >3 FB Neck ROM: full    Dental no notable dental hx. (+) Teeth Intact   Pulmonary Current Smoker,    Pulmonary exam normal breath sounds clear to auscultation       Cardiovascular negative cardio ROS Normal cardiovascular exam Rhythm:regular Rate:Normal     Neuro/Psych negative neurological ROS  negative psych ROS   GI/Hepatic negative GI ROS, Neg liver ROS,   Endo/Other  diabetesMorbid obesity  Renal/GU negative Renal ROS  negative genitourinary   Musculoskeletal   Abdominal (+) + obese,   Peds  Hematology negative hematology ROS (+)   Anesthesia Other Findings   Reproductive/Obstetrics (+) Pregnancy                             Anesthesia Physical Anesthesia Plan  ASA: III  Anesthesia Plan: Epidural   Post-op Pain Management:    Induction:   PONV Risk Score and Plan:   Airway Management Planned:   Additional Equipment:   Intra-op Plan:   Post-operative Plan:   Informed Consent: I have reviewed the patients History and Physical, chart, labs and discussed the procedure including the risks, benefits and alternatives for the proposed anesthesia with the patient or authorized representative who has indicated his/her understanding and acceptance.       Plan Discussed with:   Anesthesia Plan Comments:         Anesthesia Quick Evaluation
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
## 2019-01-06 ENCOUNTER — Other Ambulatory Visit: Payer: Medicaid Other | Admitting: Obstetrics & Gynecology

## 2019-01-06 ENCOUNTER — Encounter (HOSPITAL_COMMUNITY): Payer: Self-pay

## 2019-01-06 LAB — CBC
HCT: 29.8 % — ABNORMAL LOW (ref 36.0–46.0)
Hemoglobin: 10.1 g/dL — ABNORMAL LOW (ref 12.0–15.0)
MCH: 28.7 pg (ref 26.0–34.0)
MCHC: 33.9 g/dL (ref 30.0–36.0)
MCV: 84.7 fL (ref 80.0–100.0)
Platelets: 330 10*3/uL (ref 150–400)
RBC: 3.52 MIL/uL — ABNORMAL LOW (ref 3.87–5.11)
RDW: 13.8 % (ref 11.5–15.5)
WBC: 15.3 10*3/uL — ABNORMAL HIGH (ref 4.0–10.5)
nRBC: 0 % (ref 0.0–0.2)

## 2019-01-06 LAB — GLUCOSE, CAPILLARY
Glucose-Capillary: 113 mg/dL — ABNORMAL HIGH (ref 70–99)
Glucose-Capillary: 122 mg/dL — ABNORMAL HIGH (ref 70–99)

## 2019-01-06 NOTE — Progress Notes (Signed)
Post Partum Day 1  Subjective: no complaints, up ad lib, voiding, tolerating PO and + flatus. Patient has been up and ambulating in her room but has otherwise not ambulated the hallway. She denies vision changes, light headedness, dizziness, and shortness of breath.   Objective: Blood pressure 130/80, pulse 86, temperature 98.3 F (36.8 C), temperature source Oral, resp. rate 18, height 5\' 4"  (1.626 m), weight 135.1 kg, last menstrual period 04/06/2018, SpO2 99 %.  Physical Exam:  General: alert, cooperative and no distress Lochia: appropriate Uterine Fundus: firm Incision: N/A DVT Evaluation: No evidence of DVT seen on physical exam. No significant calf/ankle edema.  Recent Labs    01/13/19 0808 01/06/19 0527  HGB 12.2 10.1*  HCT 35.6* 29.8*    Assessment/Plan: Plan for discharge tomorrow, Breastfeeding and Contraception Nexplanon   LOS: 2 days   Dollene Cleveland 01/06/2019, 9:32 AM

## 2019-01-06 NOTE — Plan of Care (Signed)
Progressing appropriately. Encouraged to call for assistance as needed, and for LATCH assessment.  

## 2019-01-06 NOTE — Anesthesia Postprocedure Evaluation (Signed)
Anesthesia Post Note  Patient: Selena Barr  Procedure(s) Performed: AN AD HOC LABOR EPIDURAL     Patient location during evaluation: Mother Baby Anesthesia Type: Epidural Level of consciousness: awake and alert Pain management: pain level controlled Vital Signs Assessment: post-procedure vital signs reviewed and stable Respiratory status: spontaneous breathing, nonlabored ventilation and respiratory function stable Cardiovascular status: stable Postop Assessment: no headache, no backache and epidural receding Anesthetic complications: no    Last Vitals:  Vitals:   01/05/19 2354 01/06/19 0405  BP: 140/82 130/80  Pulse: 82 86  Resp: 18 18  Temp: 37.1 C 36.8 C  SpO2:      Last Pain:  Vitals:   01/06/19 0538  TempSrc:   PainSc: 6    Pain Goal: Patients Stated Pain Goal: 2 (01-Feb-2019 2354)                 Junious Silk
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
## 2019-01-07 MED ORDER — PNEUMOCOCCAL VAC POLYVALENT 25 MCG/0.5ML IJ INJ
0.5000 mL | INJECTION | INTRAMUSCULAR | Status: AC
  Administered 2019-01-07: 0.5 mL via INTRAMUSCULAR
  Filled 2019-01-07: qty 0.5

## 2019-01-07 MED ORDER — IBUPROFEN 600 MG PO TABS: 600.0000 mg | ORAL_TABLET | Freq: Four times a day (QID) | ORAL | 0 refills | Status: DC

## 2019-01-07 MED ORDER — SENNOSIDES-DOCUSATE SODIUM 8.6-50 MG PO TABS: 1.0000 | ORAL_TABLET | ORAL | 0 refills | Status: DC

## 2019-01-09 ENCOUNTER — Encounter: Payer: Medicaid Other | Admitting: Advanced Practice Midwife

## 2019-01-09 ENCOUNTER — Other Ambulatory Visit: Payer: Medicaid Other

## 2019-02-02 DIAGNOSIS — 419620001 Death: Secondary | SNOMED CT | POA: Diagnosis present

## 2019-02-02 DEATH — deceased

## 2019-02-03 ENCOUNTER — Ambulatory Visit (INDEPENDENT_AMBULATORY_CARE_PROVIDER_SITE_OTHER): Payer: Medicaid Other | Admitting: Women's Health

## 2019-02-03 ENCOUNTER — Encounter: Payer: Self-pay | Admitting: Women's Health

## 2019-02-03 DIAGNOSIS — Z1389 Encounter for screening for other disorder: Secondary | ICD-10-CM

## 2019-02-03 DIAGNOSIS — I10 Essential (primary) hypertension: Secondary | ICD-10-CM | POA: Insufficient documentation

## 2019-02-03 DIAGNOSIS — Z3202 Encounter for pregnancy test, result negative: Secondary | ICD-10-CM | POA: Diagnosis not present

## 2019-02-03 DIAGNOSIS — O165 Unspecified maternal hypertension, complicating the puerperium: Secondary | ICD-10-CM

## 2019-02-03 LAB — POCT URINE PREGNANCY: Preg Test, Ur: NEGATIVE

## 2019-02-03 MED ORDER — AMLODIPINE BESYLATE 10 MG PO TABS: 10.0000 mg | ORAL_TABLET | Freq: Every day | ORAL | 1 refills | Status: DC

## 2019-02-03 MED ORDER — NORETHINDRONE 0.35 MG PO TABS: 1.0000 | ORAL_TABLET | Freq: Every day | ORAL | 11 refills | Status: DC

## 2019-02-03 NOTE — Patient Instructions (Signed)
Come Thursday for blood pressure check with the nurse, take your blood pressure medicine at least 1 hour before you come  STOP TAKING the blood pressure medicine 2 days before your visit 2 weeks from now  Norethindrone tablets (contraception) What is this medicine? NORETHINDRONE (nor eth IN drone) is an oral contraceptive. The product contains a female hormone known as a progestin. It is used to prevent pregnancy. This medicine may be used for other purposes; ask your health care provider or pharmacist if you have questions. COMMON BRAND NAME(S): Camila, Deblitane 28-Day, Errin, Heather, [place], [place], [place], Nor-QD, Nora-BE, Norlyroc, Ortho Micronor, [employer] 28-Day What should I tell my health care provider before I take this medicine? They need to know if you have any of these conditions: -blood vessel disease or blood clots -breast, cervical, or vaginal cancer -diabetes -heart disease -kidney disease -liver disease -mental depression -migraine -seizures -stroke -vaginal bleeding -an unusual or allergic reaction to norethindrone, other medicines, foods, dyes, or preservatives -pregnant or trying to get pregnant -breast-feeding How should I use this medicine? Take this medicine by mouth with a glass of water. You may take it with or without food. Follow the directions on the prescription label. Take this medicine at the same time each day and in the order directed on the package. Do not take your medicine more often than directed. Contact your pediatrician regarding the use of this medicine in children. Special care may be needed. This medicine has been used in female children who have started having menstrual periods. A patient package insert for the product will be given with each prescription and refill. Read this sheet carefully each time. The sheet may change frequently. Overdosage: If you think you have taken too much of this medicine contact a poison control center or  emergency room at once. NOTE: This medicine is only for you. Do not share this medicine with others. What if I miss a dose? Try not to miss a dose. Every time you miss a dose or take a dose late your chance of pregnancy increases. When 1 pill is missed (even if only 3 hours late), take the missed pill as soon as possible and continue taking a pill each day at the regular time (use a back up method of birth control for the next 48 hours). If more than 1 dose is missed, use an additional birth control method for the rest of your pill pack until menses occurs. Contact your health care professional if more than 1 dose has been missed. What may interact with this medicine? Do not take this medicine with any of the following medications: -amprenavir or fosamprenavir -bosentan This medicine may also interact with the following medications: -antibiotics or medicines for infections, especially rifampin, rifabutin, rifapentine, and griseofulvin, and possibly penicillins or tetracyclines -aprepitant -barbiturate medicines, such as phenobarbital -carbamazepine -felbamate -modafinil -oxcarbazepine -phenytoin -ritonavir or other medicines for HIV infection or AIDS -St. John's wort -topiramate This list may not describe all possible interactions. Give your health care provider a list of all the medicines, herbs, non-prescription drugs, or dietary supplements you use. Also tell them if you smoke, drink alcohol, or use illegal drugs. Some items may interact with your medicine. What should I watch for while using this medicine? Visit your doctor or health care professional for regular checks on your progress. You will need a regular breast and pelvic exam and Pap smear while on this medicine. Use an additional method of birth control during the first cycle that you  take these tablets. If you have any reason to think you are pregnant, stop taking this medicine right away and contact your doctor or health care  professional. If you are taking this medicine for hormone related problems, it may take several cycles of use to see improvement in your condition. This medicine does not protect you against HIV infection (AIDS) or any other sexually transmitted diseases. What side effects may I notice from receiving this medicine? Side effects that you should report to your doctor or health care professional as soon as possible: -breast tenderness or discharge -pain in the abdomen, chest, groin or leg -severe headache -skin rash, itching, or hives -sudden shortness of breath -unusually weak or tired -vision or speech problems -yellowing of skin or eyes Side effects that usually do not require medical attention (report to your doctor or health care professional if they continue or are bothersome): -changes in sexual desire -change in menstrual flow -facial hair growth -fluid retention and swelling -headache -irritability -nausea -weight gain or loss This list may not describe all possible side effects. Call your doctor for medical advice about side effects. You may report side effects to FDA at 1-800-FDA-1088. Where should I keep my medicine? Keep out of the reach of children. Store at room temperature between 15 and 30 degrees C (59 and 86 degrees F). Throw away any unused medicine after the expiration date. NOTE: This sheet is a summary. It may not cover all possible information. If you have questions about this medicine, talk to your doctor, pharmacist, or health care provider.  2019 Elsevier/Gold Standard (2012-08-09 16:41:35)
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
## 2019-02-06 ENCOUNTER — Ambulatory Visit: Payer: Medicaid Other | Admitting: *Deleted

## 2019-02-06 ENCOUNTER — Other Ambulatory Visit: Payer: Self-pay | Admitting: Women's Health

## 2019-02-06 VITALS — BP 132/90 | HR 79

## 2019-02-06 DIAGNOSIS — Z0130 Encounter for examination of blood pressure without abnormal findings: Secondary | ICD-10-CM

## 2019-02-06 MED ORDER — HYDROCHLOROTHIAZIDE 25 MG PO TABS: 25.0000 mg | ORAL_TABLET | Freq: Every day | ORAL | 0 refills | Status: DC

## 2019-02-06 NOTE — Progress Notes (Signed)
BP reported to [facility].  Will add HCTZ to Norvasc.  Pt advised to take and stop 3 days prior to next appt. Verbalized understanding.

## 2019-02-17 ENCOUNTER — Ambulatory Visit: Payer: Medicaid Other | Admitting: Women's Health

## 2019-02-17 ENCOUNTER — Other Ambulatory Visit: Payer: Self-pay

## 2019-02-17 ENCOUNTER — Encounter: Payer: Self-pay | Admitting: Women's Health

## 2019-02-17 DIAGNOSIS — O165 Unspecified maternal hypertension, complicating the puerperium: Secondary | ICD-10-CM

## 2019-02-17 NOTE — Progress Notes (Signed)
   GYN VISIT Patient name: Selena Barr MRN [id number]  Date of birth: 06/29/1991 Chief Complaint:   Blood Pressure Check  History of Present Illness:   Selena Barr is a 28 y.o. G86P3002 African American female 6wks s/p SVB, being seen today for bp check. Dx w/ PPHTN at 4wks pp visit, started on norvasc 10, added hctz 25 2d later at bp check w/ nurse. She stopped taking hctz 2d ago as directed, still taking norvasc as directed. Bottlefeeding     Patient's last menstrual period was 02/10/2019. The current method of family planning is oral progesterone-only contraceptive. Last pap 08/19/18. Results were:  normal Review of Systems:   Pertinent items are noted in HPI Denies fever/chills, dizziness, headaches, visual disturbances, fatigue, shortness of breath, chest pain, abdominal pain, vomiting, abnormal vaginal discharge/itching/odor/irritation, problems with periods, bowel movements, urination, or intercourse unless otherwise stated above.  Pertinent History Reviewed:  Reviewed past medical,surgical, social, obstetrical and family history.  Reviewed problem list, medications and allergies. Physical Assessment:   Vitals:   02/17/19 0956  BP: 132/87  Pulse: (!) 102  Weight: 289 lb (131.1 kg)  Height: 5\' 2"  (1.575 m)  Body mass index is 52.86 kg/m.       Physical Examination:   General appearance: alert, well appearing, and in no distress  Mental status: alert, oriented to person, place, and time  Skin: warm & dry   Cardiovascular: normal heart rate noted  Respiratory: normal respiratory effort, no distress  Abdomen: not examined  Pelvic: examination not indicated  Extremities: no edema   No results found for this or any previous visit (from the past 24 hour(s)).  Assessment & Plan:  1) 6wks s/p SVB> bottlefeeding  2) PPHTN> continue norvasc 10mg  x 2wks, stop 2d before bp check w/ RN  Meds: No orders of the defined types were placed in this encounter.   No orders of  the defined types were placed in this encounter.   Return in about 16 days (around 03/05/2019) for bp check with nurse.  Cheral Marker CNM, [facility] 02/17/2019 10:13 AM

## 2019-02-17 NOTE — Patient Instructions (Signed)
Stop taking amlodipine (norvasc) 2 days before blood pressure check with nurse

## 2019-03-05 ENCOUNTER — Other Ambulatory Visit: Payer: Self-pay

## 2019-03-05 ENCOUNTER — Ambulatory Visit: Payer: Medicaid Other | Admitting: *Deleted

## 2019-03-05 VITALS — BP 124/78 | HR 75

## 2019-03-05 DIAGNOSIS — Z0130 Encounter for examination of blood pressure without abnormal findings: Secondary | ICD-10-CM

## 2019-03-05 NOTE — Progress Notes (Signed)
Pt in for BP Check, stopped norvasc 2 days ago. Feels good, no headache, no visual disturbances.

## 2019-03-20 ENCOUNTER — Other Ambulatory Visit: Payer: Self-pay | Admitting: Obstetrics & Gynecology

## 2019-05-16 ENCOUNTER — Telehealth: Payer: Self-pay | Admitting: Obstetrics & Gynecology

## 2019-05-16 ENCOUNTER — Other Ambulatory Visit: Payer: Self-pay | Admitting: Obstetrics & Gynecology

## 2019-05-16 NOTE — Telephone Encounter (Signed)
Please let pt know if we can call in Megase to [facility] on [address] she does not have any refills

## 2019-07-04 IMAGING — DX DG CHEST 2V
2 series · 2 of 2 positions shown · non-contrast
Comparison: 08/11/2017

CLINICAL DATA: Productive cough, congestion, and shortness of
breath for 2 days, history diabetes mellitus, smoker

EXAM:
CHEST - 2 VIEW

[chest pa]
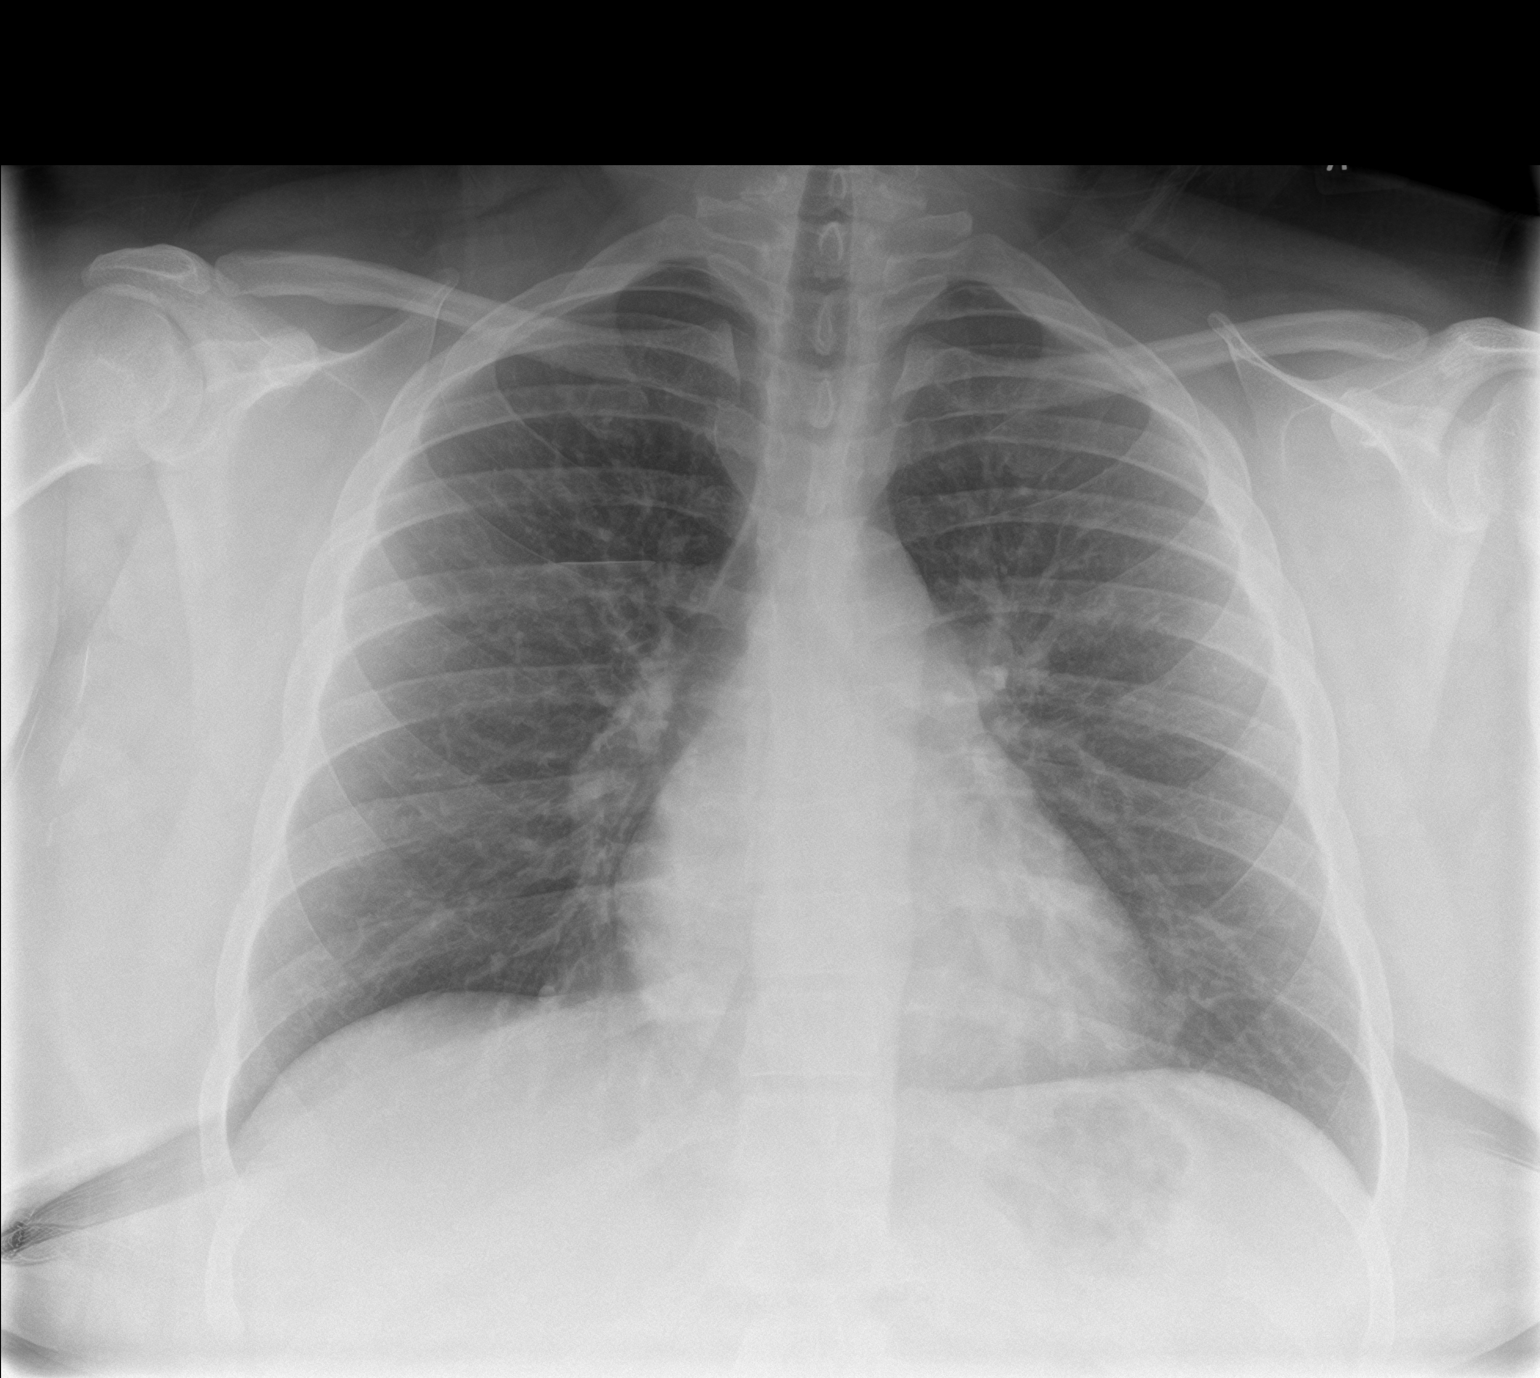

[chest lat]
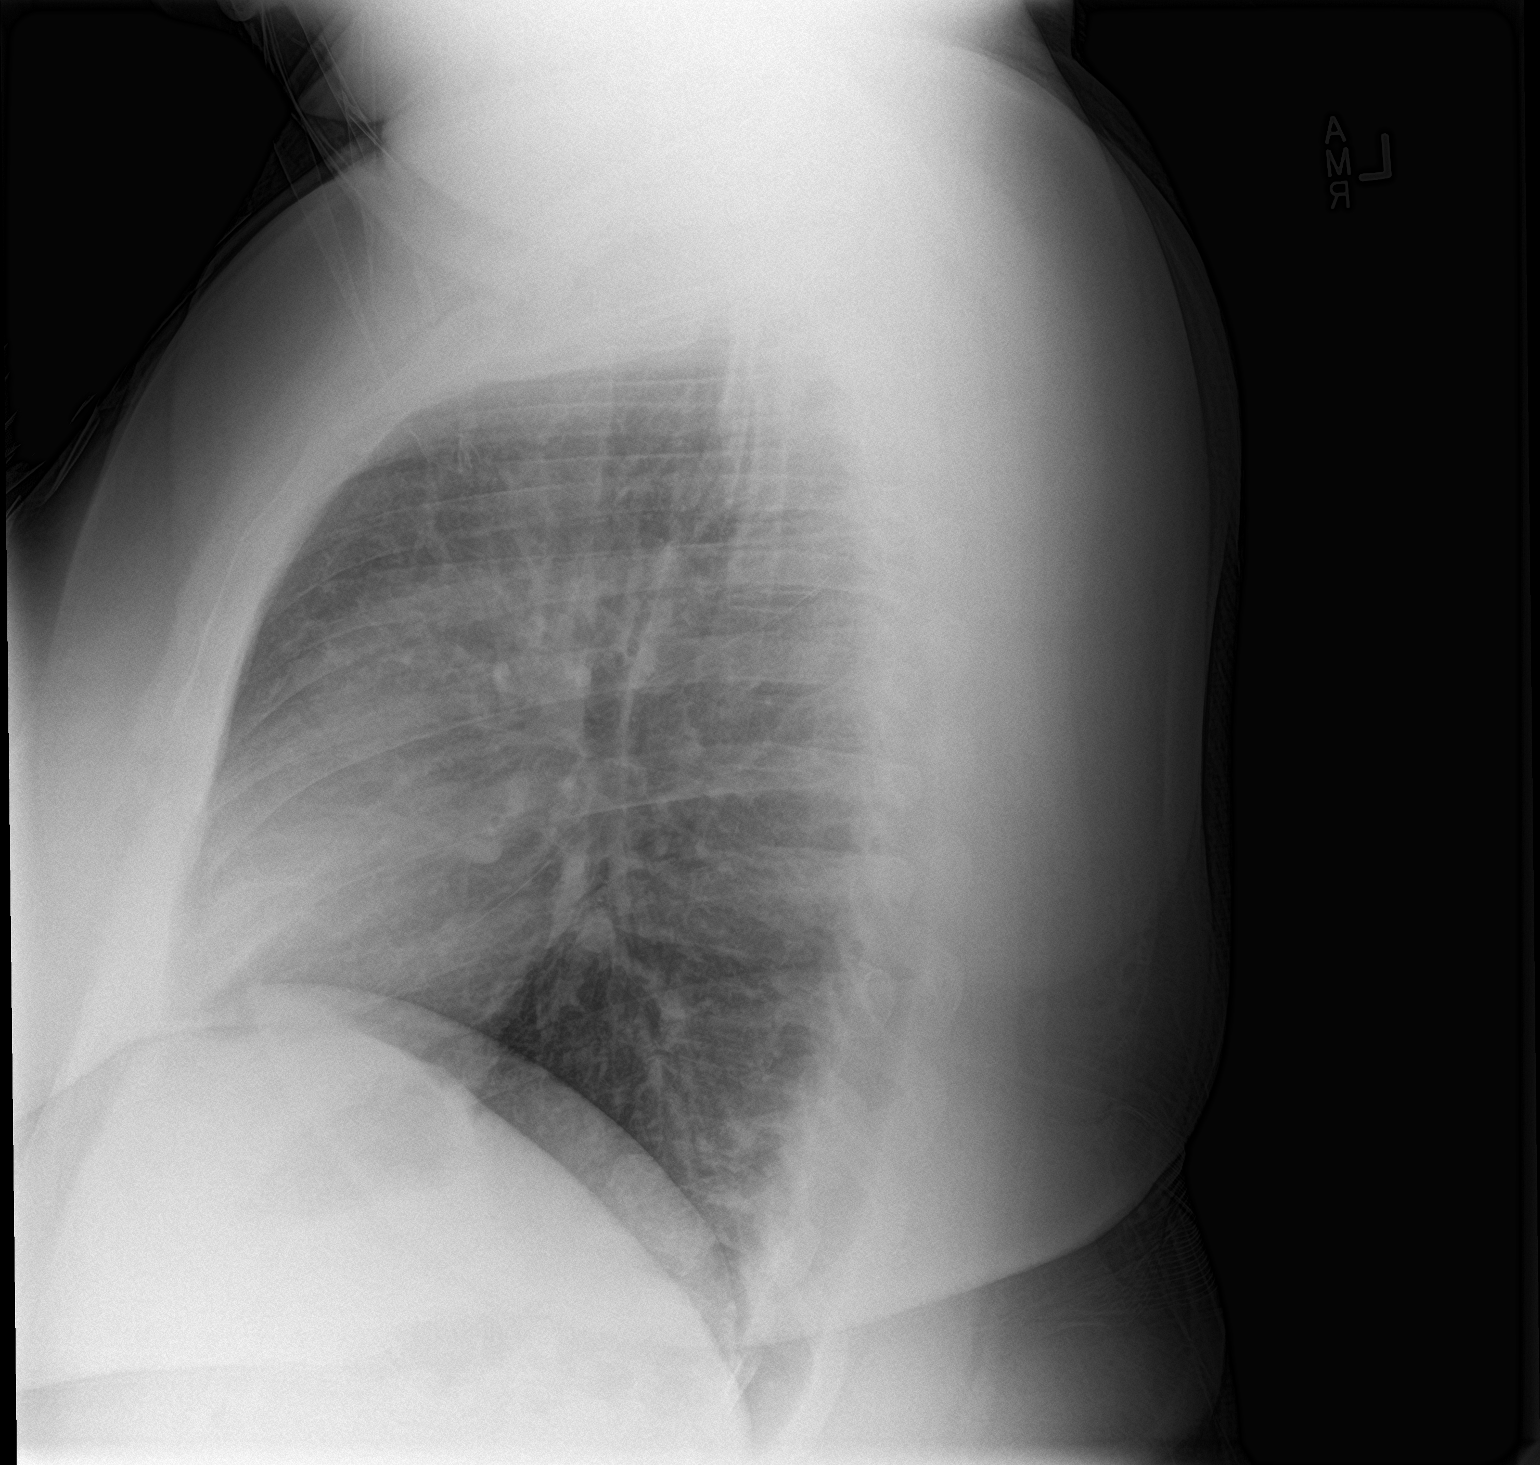

[2 of 2 positions shown; findings below may reference images not displayed]

FINDINGS: Normal heart size, mediastinal contours, and pulmonary vascularity.

Lungs clear.

No pleural effusion or pneumothorax.

Bones unremarkable.
IMPRESSION: Normal exam.

## 2019-08-22 ENCOUNTER — Other Ambulatory Visit: Payer: Self-pay

## 2019-08-22 DIAGNOSIS — R6889 Other general symptoms and signs: Secondary | ICD-10-CM | POA: Diagnosis not present

## 2019-08-22 DIAGNOSIS — Z20822 Contact with and (suspected) exposure to covid-19: Secondary | ICD-10-CM

## 2019-08-23 LAB — NOVEL CORONAVIRUS, NAA: SARS-CoV-2, NAA: NOT DETECTED

## 2019-11-03 ENCOUNTER — Ambulatory Visit: Payer: Self-pay | Admitting: *Deleted

## 2019-11-03 NOTE — Telephone Encounter (Signed)
Pt states that she had a rapid COVID-19 test done today at her job and was notified that her she was positive. Pt states that there have been 2 or more positive cases at her job from a worker and a couple of residents. Pt states she was test by the physical therapist but was not provided with any instructions with positive results. Results not available in Epic. Pt states that she has had a sore throat for a while. PT lady that did instructions. Pt states that her sister has been with her everyday and also works with her and her test came back negative. Informed pt that she should isolate from her children and quarantine for the next 14 days due to positive test result. Pt informed to seek treatment in the ED/Urgent Care for worsening SOB and/or respiratory distress. Pt advised to use over the counter medications for symptoms. Pt advised that her sister and her children should also quarantine and monitor for symptoms for the next 14 days. Pt informed to contact her children's pediatrician if symptoms develop. Understanding verbalized.

## 2019-11-04 ENCOUNTER — Other Ambulatory Visit: Payer: Self-pay

## 2019-11-04 DIAGNOSIS — Z20822 Contact with and (suspected) exposure to covid-19: Secondary | ICD-10-CM

## 2019-11-06 LAB — NOVEL CORONAVIRUS, NAA: SARS-CoV-2, NAA: DETECTED — AB

## 2019-12-11 ENCOUNTER — Other Ambulatory Visit: Payer: Self-pay

## 2019-12-11 ENCOUNTER — Ambulatory Visit
Admission: EM | Admit: 2019-12-11 | Discharge: 2019-12-11 | Disposition: A | Discharge status: Home / Self Care | Payer: Medicaid Other | Attending: Emergency Medicine | Admitting: Emergency Medicine

## 2019-12-11 DIAGNOSIS — J029 Acute pharyngitis, unspecified: Secondary | ICD-10-CM | POA: Diagnosis not present

## 2019-12-11 LAB — POCT RAPID STREP A (OFFICE): Rapid Strep A Screen: NEGATIVE

## 2019-12-11 MED ORDER — FLUTICASONE PROPIONATE 50 MCG/ACT NA SUSP: 1.0000 | Freq: Every day | NASAL | 0 refills | Status: DC

## 2019-12-11 MED ORDER — ZYRTEC ALLERGY 10 MG PO CAPS: 10.0000 mg | ORAL_CAPSULE | ORAL | 0 refills | Status: DC

## 2019-12-11 NOTE — ED Provider Notes (Signed)
[facility]    CSN: [id number] Arrival date & time: 12/11/19  1322      History   Chief Complaint Chief Complaint  Patient presents with  . Sore Throat    HPI Selena Barr is a 29 y.o. female.   Selena Barr 29 year old femalepresents to the urgent care with abrupt onset of sore throat for the past 2 days.  Denies  sick exposure to strep, flu or mono or covid, or precipitating event.  Has tried OTC tylenol  without relief.  Symptoms are made worse with swallowing, but tolerating liquids and own secretions without difficulty.  Denies previous symptoms in the past.   Denies fever, chills, fatigue, ear pain, sinus pain, rhinorrhea, nasal congestion, cough, SOB, wheezing, chest pain, nausea, rash, changes in bowel or bladder habits  The history is provided by the patient. No language interpreter was used.  Sore Throat    Past Medical History:  Diagnosis Date  . Allergy   . Diabetes mellitus without complication ([place])   . Hyperlipidemia 10/24/2016  . Tobacco abuse 09/22/2016    Patient Active Problem List   Diagnosis Date Noted  . 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 Rubella non-immune status, antepartum August 13, 2018  . Death of infant 12-Aug-2018  . Mixed hyperlipidemia 10/24/2016  . Type 2 diabetes mellitus ([place]) 09/22/2016  . Morbid obesity ([place]) 09/22/2016  . Tobacco abuse 09/22/2016    Past Surgical History:  Procedure Laterality Date  . WISDOM TOOTH EXTRACTION      OB History    Gravida  3   Para  3   Term  3   Preterm      AB      Living  2     SAB      TAB      Ectopic      Multiple      Live Births  3            Home Medications    Prior to Admission medications   Medication Sig Start Date End Date Taking? Authorizing Provider  ACCU-CHEK AVIVA PLUS test strip USE UP TO QID TO TEST BLOOD SUGAR AS DIRECTED 05/18/18   [provider]  ACCU-CHEK SOFTCLIX LANCETS lancets USE UP TO QID TO TEST BLOOD SUGAR  AS  DIRECTED 05/18/18   [provider]  amLODipine (NORVASC) 10 MG tablet Take 1 tablet (10 mg total) by mouth daily. Patient not taking: Reported on 03/05/2019 02/03/19   Roma Schanz, CNM  blood glucose meter kit and supplies KIT Dispense based on patient and insurance preference. Use up to four times daily as directed. (FOR ICD-9 250.00, 250.01). Patient not taking: Reported on 03/05/2019 05/09/18   Estill Dooms, NP  Cetirizine HCl (ZYRTEC ALLERGY) 10 MG CAPS Take 1 capsule (10 mg total) by mouth 1 day or 1 dose for 30 doses. 12/11/19 01/10/20  Makalia Bare, Darrelyn Hillock, FNP  fluticasone (FLONASE) 50 MCG/ACT nasal spray Place 1 spray into both nostrils daily for 14 days. 12/11/19 12/25/19  Mj Willis, Darrelyn Hillock, FNP  hydrochlorothiazide (HYDRODIURIL) 25 MG tablet Take 1 tablet (25 mg total) by mouth daily. Patient not taking: Reported on 03/05/2019 02/06/19   Roma Schanz, CNM  megestrol (MEGACE) 40 MG tablet TAKE 3 TABLETS BY MOUTH DAILY X 5 DAYS, TAKE 2 TABLETS DAILY X 5 DAYS, THEN TAKE 1 TABLET DAILY 05/16/19   Florian Buff, MD  metFORMIN (GLUCOPHAGE) 1000 MG tablet Take 1 tablet (1,000 mg total) by  mouth 2 (two) times daily with a meal. 05/09/18   Estill Dooms, NP  norethindrone (MICRONOR,CAMILA,ERRIN) 0.35 MG tablet Take 1 tablet (0.35 mg total) by mouth daily. 02/03/19   Roma Schanz, CNM    Family History Family History  Problem Relation Age of Onset  . Diabetes Mother   . Alcohol abuse Mother   . COPD Mother   . Drug abuse Mother   . Cancer Maternal Grandmother        throat  . Cancer Maternal Grandfather        throat  . Diabetes Maternal Grandfather     Social History Social History   Tobacco Use  . Smoking status: Current Every Day Smoker    Packs/day: 0.25    Years: 2.00    Pack years: 0.50    Types: Cigarettes  . Smokeless tobacco: Never Used  . Tobacco comment: "5 per day"  Substance Use Topics  . Alcohol use: No  . Drug use: No    Comment: denies use  08/28/14     Allergies   Solu-medrol [methylprednisolone sodium succ]   Review of Systems Review of Systems  Constitutional: Negative.   HENT: Positive for sore throat.   Respiratory: Negative.   Cardiovascular: Negative.   Gastrointestinal: Negative.   Neurological: Negative.   ROS:  Physical Exam Triage Vital Signs ED Triage Vitals  Enc Vitals Group     BP 12/11/19 1336 (!) 142/90     Pulse Rate 12/11/19 1336 94     Resp 12/11/19 1336 18     Temp 12/11/19 1336 99.7 F (37.6 C)     Temp Source 12/11/19 1336 Oral     SpO2 12/11/19 1336 97 %     Weight --      Height --      Head Circumference --      Peak Flow --      Pain Score 12/11/19 1332 3     Pain Loc --      Pain Edu? --      Excl. in [place]? --    No data found.  Updated Vital Signs BP (!) 142/90 (BP Location: Right Arm)   Pulse 94   Temp 99.7 F (37.6 C) (Oral)   Resp 18   SpO2 97%   Visual Acuity Right Eye Distance:   Left Eye Distance:   Bilateral Distance:    Right Eye Near:   Left Eye Near:    Bilateral Near:     Physical Exam Vitals and nursing note reviewed.  Constitutional:      General: She is not in acute distress.    Appearance: Normal appearance. She is obese. She is not ill-appearing or toxic-appearing.  HENT:     Head: Normocephalic.     Right Ear: Tympanic membrane, ear canal and external ear normal. There is no impacted cerumen.     Left Ear: Ear canal and external ear normal. There is no impacted cerumen.     Nose: Nose normal. No congestion.     Mouth/Throat:     Mouth: Mucous membranes are moist.     Pharynx: No oropharyngeal exudate or posterior oropharyngeal erythema.     Tonsils: 2+ on the right. 2+ on the left.  Eyes:     Conjunctiva/sclera: Conjunctivae normal.  Cardiovascular:     Rate and Rhythm: Normal rate and regular rhythm.     Pulses: Normal pulses.     Heart sounds: Normal heart sounds. No murmur.  Pulmonary:     Effort: Pulmonary effort is normal. No  respiratory distress.     Breath sounds: Normal breath sounds. No wheezing or rhonchi.  Chest:     Chest wall: No tenderness.  Abdominal:     General: Abdomen is flat. Bowel sounds are normal. There is no distension.     Palpations: Abdomen is soft. There is no mass.     Tenderness: There is no abdominal tenderness. There is no guarding or rebound.     Hernia: No hernia is present.  Skin:    Capillary Refill: Capillary refill takes less than 2 seconds.  Neurological:     Mental Status: She is alert and oriented to person, place, and time.      UC Treatments / Results  Labs (all labs ordered are listed, but only abnormal results are displayed) Labs Reviewed  CULTURE, GROUP A STREP [facility])  POCT RAPID STREP A (OFFICE)    EKG   Radiology No results found.  Procedures Procedures (including critical care time)  Medications Ordered in UC Medications - No data to display  Initial Impression / Assessment and Plan / UC Course  I have reviewed the triage vital signs and the nursing notes.  Pertinent labs & imaging results that were available during my care of the patient were reviewed by me and considered in my medical decision making (see chart for details).    Point-of-care strep test was ordered and result was reviewed.  Results showed negative strep test.  We will send it for culture and patient will be called if result is abnormal.  Patient is stable for discharge.  Benign physical exam.  Advised patient to take medication as prescribed.  To return for worsening of symptoms.  Patient verbalized understanding of the plan of care.  Final Clinical Impressions(s) / UC Diagnoses   Final diagnoses:  Sore throat     Discharge Instructions     Strep test is negative.  Will send for culture and someone will call if result is abnormal. Drink plenty of water and rest Perform salt water gargles at home, throat lozenges Follow up with PCP if symptoms persist Return or go to  ER if you have any new or worsening symptoms      ED Prescriptions    Medication Sig Dispense Auth. Provider   Cetirizine HCl (ZYRTEC ALLERGY) 10 MG CAPS Take 1 capsule (10 mg total) by mouth 1 day or 1 dose for 30 doses. 30 capsule Cola Gane S, FNP   fluticasone (FLONASE) 50 MCG/ACT nasal spray Place 1 spray into both nostrils daily for 14 days. 16 g Emerson Monte, FNP     PDMP not reviewed this encounter.   Emerson Monte, [place] 12/11/19 1359

## 2019-12-11 NOTE — Discharge Instructions (Addendum)
Strep test is negative.  Will send it for culture and someone will call if result is abnormal. Drink plenty of water and rest Perform salt water gargles at home, throat lozenges Follow up with PCP if symptoms persist Return or go to ER if you have any new or worsening symptoms

## 2019-12-11 NOTE — ED Triage Notes (Signed)
Pt presents to UC w/ c/o sore throat x2 days.

## 2019-12-13 LAB — CULTURE, GROUP A STREP (THRC)

## 2020-03-02 DIAGNOSIS — Z20828 Contact with and (suspected) exposure to other viral communicable diseases: Secondary | ICD-10-CM | POA: Diagnosis not present

## 2020-03-02 DIAGNOSIS — U071 COVID-19: Secondary | ICD-10-CM | POA: Diagnosis not present

## 2020-03-06 ENCOUNTER — Ambulatory Visit
Admission: EM | Admit: 2020-03-06 | Discharge: 2020-03-06 | Disposition: A | Discharge status: Home / Self Care | Payer: Medicaid Other | Attending: Emergency Medicine | Admitting: Emergency Medicine

## 2020-03-06 ENCOUNTER — Other Ambulatory Visit: Payer: Self-pay

## 2020-03-06 DIAGNOSIS — M5441 Lumbago with sciatica, right side: Secondary | ICD-10-CM

## 2020-03-06 DIAGNOSIS — M62830 Muscle spasm of back: Secondary | ICD-10-CM

## 2020-03-06 DIAGNOSIS — K0889 Other specified disorders of teeth and supporting structures: Secondary | ICD-10-CM

## 2020-03-06 DIAGNOSIS — R030 Elevated blood-pressure reading, without diagnosis of hypertension: Secondary | ICD-10-CM

## 2020-03-06 DIAGNOSIS — K047 Periapical abscess without sinus: Secondary | ICD-10-CM

## 2020-03-06 LAB — POCT FASTING CBG KUC MANUAL ENTRY: POCT Glucose (KUC): 115 mg/dL — AB (ref 70–99)

## 2020-03-06 MED ORDER — NAPROXEN 375 MG PO TABS: 375.0000 mg | ORAL_TABLET | Freq: Two times a day (BID) | ORAL | 0 refills | Status: DC

## 2020-03-06 MED ORDER — CHLORHEXIDINE GLUCONATE 0.12 % MT SOLN: 15.0000 mL | Freq: Two times a day (BID) | OROMUCOSAL | 0 refills | Status: DC

## 2020-03-06 MED ORDER — AMOXICILLIN-POT CLAVULANATE 875-125 MG PO TABS: 1.0000 | ORAL_TABLET | Freq: Two times a day (BID) | ORAL | 0 refills | Status: AC

## 2020-03-06 MED ORDER — CYCLOBENZAPRINE HCL 10 MG PO TABS: 10.0000 mg | ORAL_TABLET | Freq: Every day | ORAL | 0 refills | Status: DC

## 2020-03-06 NOTE — Discharge Instructions (Signed)
Continue conservative management of rest, ice, heat, and gentle stretches Take naproxen as needed for pain relief (may cause abdominal discomfort, ulcers, and GI bleeds avoid taking with other NSAIDs) Take cyclobenzaprine at nighttime for symptomatic relief. Avoid driving or operating heavy machinery while using medication. Follow up with PCP if symptoms persist Return or go to the ER if you have any new or worsening symptoms (fever, chills, chest pain, abdominal pain, changes in bowel or bladder habits, pain radiating into lower legs, etc...)   Peridex mouth wash prescribed.  Use as needed Augmentin prescribed.  Take as directed and to completion Recommend a soft diet  Maintain proper dental hygiene Follow up with dentist as soon as possible for further evaluation and treatment fever, chills, nausea, vomiting, oral swelling, difficulty swallowing/ breathing, etc...  Blood pressure elevated in office.  Please recheck in 24 hours.  If it continues to be greater than 140/90 please follow up with PCP for further evaluation and management.

## 2020-03-06 NOTE — ED Triage Notes (Signed)
Pt presents with c/o low back pain that has gotten worse for past month. 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

## 2020-03-06 NOTE — ED Provider Notes (Signed)
[place]   [id number] 03/06/20 Arrival Time: 2010  CC: Back and dental PAIN  SUBJECTIVE: History from: patient. Selena Barr is a 29 y.o. female complains of intermittent RT low back pain x 1 month.  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  Localizes the pain to the RT low back.  Describes the pain as intermittent and sharp in character.  Has tried OTC medications without relief.  Symptoms are made worse with standing.  Denies fever, chills, erythema, ecchymosis, effusion, weakness, numbness and tingling, saddle paresthesias, loss of bowel or bladder function.      Patient also mentions LL dental pain x 2 days.  Symptoms began after eating pork skin.  States she chipped her tooth and is concerned for infection.  Describes pain as constant and achy.  Worse with chewing.  Has tried aleve with relief.  Denies difficulty breathing, oral swelling, difficulty swallowing.    ROS: As per HPI.  All other pertinent ROS negative.     Past Medical History:  Diagnosis Date  . Allergy   . Diabetes mellitus without complication ([place])   . Hyperlipidemia 10/24/2016  . Tobacco abuse 09/22/2016   Past Surgical History:  Procedure Laterality Date  . WISDOM TOOTH EXTRACTION     Allergies  Allergen Reactions  . Solu-Medrol [Methylprednisolone Sodium Succ]     Rash, lip swelling    No current facility-administered medications on file prior to encounter.   Current Outpatient Medications on File Prior to Encounter  Medication Sig Dispense Refill  . ACCU-CHEK AVIVA PLUS test strip USE UP TO QID TO TEST BLOOD SUGAR AS DIRECTED  0  . ACCU-CHEK SOFTCLIX LANCETS lancets USE UP TO QID TO TEST BLOOD SUGAR  AS DIRECTED  0  . amLODipine (NORVASC) 10 MG tablet Take 1 tablet (10 mg total) by mouth daily. (Patient not taking: Reported on 03/05/2019) 30 tablet 1  . blood glucose meter kit and supplies KIT Dispense based on patient and insurance preference. Use up to four times daily as  directed. (FOR ICD-9 250.00, 250.01). (Patient not taking: Reported on 03/05/2019) 1 each prn  . Cetirizine HCl (ZYRTEC ALLERGY) 10 MG CAPS Take 1 capsule (10 mg total) by mouth 1 day or 1 dose for 30 doses. 30 capsule 0  . fluticasone (FLONASE) 50 MCG/ACT nasal spray Place 1 spray into both nostrils daily for 14 days. 16 g 0  . hydrochlorothiazide (HYDRODIURIL) 25 MG tablet Take 1 tablet (25 mg total) by mouth daily. (Patient not taking: Reported on 03/05/2019) 30 tablet 0  . [DISCONTINUED] metFORMIN (GLUCOPHAGE) 1000 MG tablet Take 1 tablet (1,000 mg total) by mouth 2 (two) times daily with a meal. 180 tablet 3  . [DISCONTINUED] norethindrone (MICRONOR,CAMILA,ERRIN) 0.35 MG tablet Take 1 tablet (0.35 mg total) by mouth daily. 1 Package 11   Social History   Socioeconomic History  . Marital status: Single    Spouse name: Not on file  . Number of children: 1  . Years of education: 4  . Highest education level: Not on file  Occupational History    Comment: unemployed  Tobacco Use  . Smoking status: Current Every Day Smoker    Packs/day: 0.25    Years: 2.00    Pack years: 0.50    Types: Cigarettes  . Smokeless tobacco: Never Used  . Tobacco comment: "5 per day"  Substance and Sexual Activity  . Alcohol use: No  . Drug use: No    Comment: denies use 08/28/14  .  Sexual activity: Yes    Birth control/protection: Pill  Other Topics Concern  . Not on file  Social History Narrative   Lives with mother and brother, sister, daughter   Social Determinants of Health   Financial Resource Strain:   . Difficulty of Paying Living Expenses:   Food Insecurity:   . Worried About Charity fundraiser in the Last Year:   . Arboriculturist in the Last Year:   Transportation Needs:   . Film/video editor (Medical):   Marland Kitchen Lack of Transportation (Non-Medical):   Physical Activity:   . Days of Exercise per Week:   . Minutes of Exercise per Session:   Stress:   . Feeling of Stress :   Social  Connections:   . Frequency of Communication with Friends and Family:   . Frequency of Social Gatherings with Friends and Family:   . Attends Religious Services:   . Active Member of Clubs or Organizations:   . Attends Archivist Meetings:   Marland Kitchen Marital Status:   Intimate Partner Violence:   . Fear of Current or Ex-Partner:   . Emotionally Abused:   Marland Kitchen Physically Abused:   . Sexually Abused:    Family History  Problem Relation Age of Onset  . Diabetes Mother   . Alcohol abuse Mother   . COPD Mother   . Drug abuse Mother   . Cancer Maternal Grandmother        throat  . Cancer Maternal Grandfather        throat  . Diabetes Maternal Grandfather     OBJECTIVE:  Vitals:   03/06/20 0839 03/06/20 0854  BP: (!) 161/105 (!) 159/105  Pulse: 74   Resp: 18   Temp: 98.7 F (37.1 C)   SpO2: 100%     General appearance: ALERT; in no acute distress.  Head: NCAT ENT: PERRL, EOMI grossly; oropharynx clear, LT lower back molar with partial tooth avulsion no erythema, swelling, or abscess present; tolerating own secretions without difficulty Lungs: Normal respiratory effort; CTAB CV: RRR Musculoskeletal:Back Inspection: Skin warm, dry, clear and intact without obvious erythema, effusion, or ecchymosis.  Palpation: TTP over RT low back with palpable spasm ROM: FROM active and passive Strength: 5/5 shld abduction, 5/5 shld adduction, 5/5 elbow flexion, 5/5 elbow extension, 5/5 grip strength, 5/5 hip flexion, 5/5 hip extension, 5/5 knee flexion, 5/5 knee extension Skin: warm and dry Neurologic: Ambulates without difficulty; Sensation intact about the upper/ lower extremities Psychological: alert and cooperative; normal mood and affect   LABS:  Results for orders placed or performed during the hospital encounter of 03/06/20 (from the past 24 hour(s))  POCT CBG (manual entry)     Status: Abnormal   Collection Time: 03/06/20  8:55 AM  Result Value Ref Range   POCT Glucose ([facility])  115 (A) 70 - 99 mg/dL     ASSESSMENT & PLAN:  1. Acute right-sided low back pain with right-sided sciatica   2. Back spasm   3. Tooth pain   4. Dental infection   5. Elevated blood pressure reading     Meds ordered this encounter  Medications  . naproxen (NAPROSYN) 375 MG tablet    Sig: Take 1 tablet (375 mg total) by mouth 2 (two) times daily.    Dispense:  20 tablet    Refill:  0    Order Specific Question:   Supervising Provider    Answer:   Raylene Everts [[id number]]  . cyclobenzaprine (  FLEXERIL) 10 MG tablet    Sig: Take 1 tablet (10 mg total) by mouth at bedtime.    Dispense:  15 tablet    Refill:  0    Order Specific Question:   Supervising Provider    Answer:   Raylene Everts [[id number]]  . amoxicillin-clavulanate (AUGMENTIN) 875-125 MG tablet    Sig: Take 1 tablet by mouth every 12 (twelve) hours for 10 days.    Dispense:  20 tablet    Refill:  0    Order Specific Question:   Supervising Provider    Answer:   Raylene Everts [[id number]]  . chlorhexidine (PERIDEX) 0.12 % solution    Sig: Use as directed 15 mLs in the mouth or throat 2 (two) times daily.    Dispense:  473 mL    Refill:  0    Order Specific Question:   Supervising Provider    Answer:   Raylene Everts [[id number]]    Continue conservative management of rest, ice, heat, and gentle stretches Take naproxen as needed for pain relief (may cause abdominal discomfort, ulcers, and GI bleeds avoid taking with other NSAIDs) Take cyclobenzaprine at nighttime for symptomatic relief. Avoid driving or operating heavy machinery while using medication. Follow up with PCP if symptoms persist Return or go to the ER if you have any new or worsening symptoms (fever, chills, chest pain, abdominal pain, changes in bowel or bladder habits, pain radiating into lower legs, etc...)   Peridex mouth wash prescribed.  Use as needed Augmentin prescribed.  Take as directed and to completion Recommend a soft diet    Maintain proper dental hygiene Follow up with dentist as soon as possible for further evaluation and treatment fever, chills, nausea, vomiting, oral swelling, difficulty swallowing/ breathing, etc...  Blood pressure elevated in office.  Please recheck in 24 hours.  If it continues to be greater than 140/90 please follow up with PCP for further evaluation and management.    Reviewed expectations re: course of current medical issues. Questions answered. Outlined signs and symptoms indicating need for more acute intervention. Patient verbalized understanding. After Visit Summary given.    Lestine Box, PA-C 03/06/20 [phone]

## 2020-03-08 DIAGNOSIS — U071 COVID-19: Secondary | ICD-10-CM | POA: Diagnosis not present

## 2020-03-08 DIAGNOSIS — Z20828 Contact with and (suspected) exposure to other viral communicable diseases: Secondary | ICD-10-CM | POA: Diagnosis not present

## 2020-03-11 ENCOUNTER — Ambulatory Visit: Payer: Medicaid Other | Admitting: Adult Health

## 2020-03-15 DIAGNOSIS — U071 COVID-19: Secondary | ICD-10-CM | POA: Diagnosis not present

## 2020-03-15 DIAGNOSIS — Z20828 Contact with and (suspected) exposure to other viral communicable diseases: Secondary | ICD-10-CM | POA: Diagnosis not present

## 2020-03-22 DIAGNOSIS — Z20828 Contact with and (suspected) exposure to other viral communicable diseases: Secondary | ICD-10-CM | POA: Diagnosis not present

## 2020-03-22 DIAGNOSIS — U071 COVID-19: Secondary | ICD-10-CM | POA: Diagnosis not present

## 2020-05-06 ENCOUNTER — Other Ambulatory Visit: Payer: Self-pay

## 2020-05-06 ENCOUNTER — Ambulatory Visit
Admission: EM | Admit: 2020-05-06 | Discharge: 2020-05-06 | Disposition: A | Discharge status: Home / Self Care | Payer: Medicaid Other | Attending: Emergency Medicine | Admitting: Emergency Medicine

## 2020-05-06 DIAGNOSIS — L02415 Cutaneous abscess of right lower limb: Secondary | ICD-10-CM

## 2020-05-06 MED ORDER — DOXYCYCLINE HYCLATE 100 MG PO CAPS: 100.0000 mg | ORAL_CAPSULE | Freq: Two times a day (BID) | ORAL | 0 refills | Status: DC

## 2020-05-06 NOTE — Discharge Instructions (Signed)
Keep dry and covered for next 24-48 hours Remove packing in 48 hours either at home or return here Afterwards begin appling warm compresses 3-4x daily for 10-15 minutes.  You may then wash site daily with warm water and mild soap Keep covered to avoid friction Take antibiotic as prescribed and to completion Return sooner or go to the ED if you have any new or worsening symptoms such as increased redness, swelling, pain, nausea, vomiting, fever, chills, etc..Marland Kitchen

## 2020-05-06 NOTE — ED Triage Notes (Signed)
Pt presents with boil on right upper thigh that began this week

## 2020-05-06 NOTE — ED Provider Notes (Signed)
[place]   [id number] 05/06/20 Arrival Time: 1713   TK:[id number]  SUBJECTIVE:  Selena Barr is a 29 y.o. female who presents with a possible abscess of her RT upper thigh x 1 week. Denies precipitating event or trauma.  Has tried warm compresses without relief.  Sore to the touch.  Reports previous symptoms in the past that improved with incision and drainage.  Denies fever, chills, nausea, vomiting.    ROS: As per HPI.  All other pertinent ROS negative.     Past Medical History:  Diagnosis Date  . Allergy   . Diabetes mellitus without complication ([place])   . Hyperlipidemia 10/24/2016  . Tobacco abuse 09/22/2016   Past Surgical History:  Procedure Laterality Date  . WISDOM TOOTH EXTRACTION     Allergies  Allergen Reactions  . Solu-Medrol [Methylprednisolone Sodium Succ]     Rash, lip swelling    No current facility-administered medications on file prior to encounter.   Current Outpatient Medications on File Prior to Encounter  Medication Sig Dispense Refill  . ACCU-CHEK AVIVA PLUS test strip USE UP TO QID TO TEST BLOOD SUGAR AS DIRECTED  0  . ACCU-CHEK SOFTCLIX LANCETS lancets USE UP TO QID TO TEST BLOOD SUGAR  AS DIRECTED  0  . Cetirizine HCl (ZYRTEC ALLERGY) 10 MG CAPS Take 1 capsule (10 mg total) by mouth 1 day or 1 dose for 30 doses. 30 capsule 0  . chlorhexidine (PERIDEX) 0.12 % solution Use as directed 15 mLs in the mouth or throat 2 (two) times daily. 473 mL 0  . fluticasone (FLONASE) 50 MCG/ACT nasal spray Place 1 spray into both nostrils daily for 14 days. 16 g 0  . naproxen (NAPROSYN) 375 MG tablet Take 1 tablet (375 mg total) by mouth 2 (two) times daily. 20 tablet 0  . [DISCONTINUED] amLODipine (NORVASC) 10 MG tablet Take 1 tablet (10 mg total) by mouth daily. (Patient not taking: Reported on 03/05/2019) 30 tablet 1  . [DISCONTINUED] hydrochlorothiazide (HYDRODIURIL) 25 MG tablet Take 1 tablet (25 mg total) by mouth daily. (Patient not taking:  Reported on 03/05/2019) 30 tablet 0  . [DISCONTINUED] metFORMIN (GLUCOPHAGE) 1000 MG tablet Take 1 tablet (1,000 mg total) by mouth 2 (two) times daily with a meal. 180 tablet 3  . [DISCONTINUED] norethindrone (MICRONOR,CAMILA,ERRIN) 0.35 MG tablet Take 1 tablet (0.35 mg total) by mouth daily. 1 Package 11   Social History   Socioeconomic History  . Marital status: Single    Spouse name: Not on file  . Number of children: 1  . Years of education: 71  . Highest education level: Not on file  Occupational History    Comment: unemployed  Tobacco Use  . Smoking status: Current Every Day Smoker    Packs/day: 0.25    Years: 2.00    Pack years: 0.50    Types: Cigarettes  . Smokeless tobacco: Never Used  . Tobacco comment: "5 per day"  Substance and Sexual Activity  . Alcohol use: No  . Drug use: No    Comment: denies use 08/28/14  . Sexual activity: Yes    Birth control/protection: Pill  Other Topics Concern  . Not on file  Social History Narrative   Lives with mother and brother, sister, daughter   Social Determinants of Health   Financial Resource Strain:   . Difficulty of Paying Living Expenses:   Food Insecurity:   . Worried About Charity fundraiser in the Last Year:   .  Ran Out of Food in the Last Year:   Transportation Needs:   . Freight forwarder (Medical):   Marland Kitchen Lack of Transportation (Non-Medical):   Physical Activity:   . Days of Exercise per Week:   . Minutes of Exercise per Session:   Stress:   . Feeling of Stress :   Social Connections:   . Frequency of Communication with Friends and Family:   . Frequency of Social Gatherings with Friends and Family:   . Attends Religious Services:   . Active Member of Clubs or Organizations:   . Attends Banker Meetings:   Marland Kitchen Marital Status:   Intimate Partner Violence:   . Fear of Current or Ex-Partner:   . Emotionally Abused:   Marland Kitchen Physically Abused:   . Sexually Abused:    Family History  Problem  Relation Age of Onset  . Diabetes Mother   . Alcohol abuse Mother   . COPD Mother   . Drug abuse Mother   . Cancer Maternal Grandmother        throat  . Cancer Maternal Grandfather        throat  . Diabetes Maternal Grandfather     OBJECTIVE:  Vitals:   05/06/20 1726  BP: (!) 166/110  Resp: 18  Temp: 97.9 F (36.6 C)     General appearance: alert; no distress Skin: 4 x 2cm induration of her RT upper anterior thigh; tender to touch; no active drainage Psychological: alert and cooperative; normal mood and affect  Procedure: Verbal consent obtained. Area over induration cleaned with betadine. Lidocaine 2% with epinephrine used to obtain local anesthesia. The most fluctuant portion of the abscess was incised with a #11 blade scalpel. Abscess cavity explored and evacuated. Loculations broken up with a curved hemostat as best as possible given patient discomfort. Cavity packed with packing material and dressed with a clean gauze dressing. Minimal bleeding. No complications.  ASSESSMENT & PLAN:  1. Abscess of right thigh     Meds ordered this encounter  Medications  . doxycycline (VIBRAMYCIN) 100 MG capsule    Sig: Take 1 capsule (100 mg total) by mouth 2 (two) times daily.    Dispense:  20 capsule    Refill:  0    Order Specific Question:   Supervising Provider    Answer:   Eustace Moore [[id number]]   Keep dry and covered for next 24-48 hours Remove packing in 48 hours either at home or return here Afterwards begin appling warm compresses 3-4x daily for 10-15 minutes.  You may then wash site daily with warm water and mild soap Keep covered to avoid friction Take antibiotic as prescribed and to completion Return sooner or go to the ED if you have any new or worsening symptoms such as increased redness, swelling, pain, nausea, vomiting, fever, chills, etc...   Reviewed expectations re: course of current medical issues. Questions answered. Outlined signs and symptoms  indicating need for more acute intervention. Patient verbalized understanding. After Visit Summary given.          Rennis Harding, PA-C 05/06/20 1753

## 2020-05-16 ENCOUNTER — Ambulatory Visit
Admission: EM | Admit: 2020-05-16 | Discharge: 2020-05-16 | Disposition: A | Discharge status: Home / Self Care | Payer: Medicaid Other | Attending: Emergency Medicine | Admitting: Emergency Medicine

## 2020-05-16 DIAGNOSIS — J069 Acute upper respiratory infection, unspecified: Secondary | ICD-10-CM

## 2020-05-16 MED ORDER — FLUTICASONE PROPIONATE 50 MCG/ACT NA SUSP: 2.0000 | Freq: Every day | NASAL | 0 refills | Status: DC

## 2020-05-16 MED ORDER — ALBUTEROL SULFATE HFA 108 (90 BASE) MCG/ACT IN AERS: 1.0000 | INHALATION_SPRAY | Freq: Four times a day (QID) | RESPIRATORY_TRACT | 0 refills | Status: DC | PRN

## 2020-05-16 MED ORDER — CETIRIZINE HCL 10 MG PO TABS: 10.0000 mg | ORAL_TABLET | Freq: Every day | ORAL | 0 refills | Status: DC

## 2020-05-16 MED ORDER — BENZONATATE 100 MG PO CAPS: 100.0000 mg | ORAL_CAPSULE | Freq: Three times a day (TID) | ORAL | 0 refills | Status: DC

## 2020-05-16 NOTE — Discharge Instructions (Signed)
Get plenty of rest and push fluids Tessalon Perles prescribed for cough zyrtec for nasal congestion, runny nose, and/or sore throat flonase for nasal congestion and runny nose Albuterol inhaler as needed for shortness of breath and/or wheezing Use OTC medications like ibuprofen or tylenol as needed fever or pain Call or go to the ED if you have any new or worsening symptoms such as fever, worsening cough, shortness of breath, chest tightness, chest pain, turning blue, changes in mental status, etc..Marland Kitchen

## 2020-05-16 NOTE — ED Triage Notes (Signed)
Pt presents with c/o productive cough that began on Friday

## 2020-05-16 NOTE — ED Provider Notes (Signed)
[facility]   [id number] 05/16/20 Arrival Time: 1515   CC: COVID symptoms  SUBJECTIVE: History from: patient.  Selena Barr is a 29 y.o. female who presents with abrupt onset of dry cough, chest tightness, and drainage x 2 days.  Children with colds.  Denies concern for COVID.  Has tried OTC medications without relief.  Symptoms are made worse at night.  Reports previous symptoms in the past.   Denies fever, chills, fatigue, sinus pain, rhinorrhea, sore throat, SOB, wheezing, chest pain, nausea, changes in bowel or bladder habits.    ROS: As per HPI.  All other pertinent ROS negative.     Past Medical History:  Diagnosis Date  . Allergy   . Diabetes mellitus without complication ([facility])   . Hyperlipidemia 10/24/2016  . Tobacco abuse 09/22/2016   Past Surgical History:  Procedure Laterality Date  . WISDOM TOOTH EXTRACTION     Allergies  Allergen Reactions  . Solu-Medrol [Methylprednisolone Sodium Succ]     Rash, lip swelling    No current facility-administered medications on file prior to encounter.   Current Outpatient Medications on File Prior to Encounter  Medication Sig Dispense Refill  . ACCU-CHEK AVIVA PLUS test strip USE UP TO QID TO TEST BLOOD SUGAR AS DIRECTED  0  . ACCU-CHEK SOFTCLIX LANCETS lancets USE UP TO QID TO TEST BLOOD SUGAR  AS DIRECTED  0  . chlorhexidine (PERIDEX) 0.12 % solution Use as directed 15 mLs in the mouth or throat 2 (two) times daily. 473 mL 0  . doxycycline (VIBRAMYCIN) 100 MG capsule Take 1 capsule (100 mg total) by mouth 2 (two) times daily. 20 capsule 0  . naproxen (NAPROSYN) 375 MG tablet Take 1 tablet (375 mg total) by mouth 2 (two) times daily. 20 tablet 0  . [DISCONTINUED] amLODipine (NORVASC) 10 MG tablet Take 1 tablet (10 mg total) by mouth daily. (Patient not taking: Reported on 03/05/2019) 30 tablet 1  . [DISCONTINUED] hydrochlorothiazide (HYDRODIURIL) 25 MG tablet Take 1 tablet (25 mg total) by mouth daily. (Patient not  taking: Reported on 03/05/2019) 30 tablet 0  . [DISCONTINUED] metFORMIN (GLUCOPHAGE) 1000 MG tablet Take 1 tablet (1,000 mg total) by mouth 2 (two) times daily with a meal. 180 tablet 3  . [DISCONTINUED] norethindrone (MICRONOR,CAMILA,ERRIN) 0.35 MG tablet Take 1 tablet (0.35 mg total) by mouth daily. 1 Package 11   Social History   Socioeconomic History  . Marital status: Single    Spouse name: Not on file  . Number of children: 1  . Years of education: 47  . Highest education level: Not on file  Occupational History    Comment: unemployed  Tobacco Use  . Smoking status: Current Every Day Smoker    Packs/day: 0.25    Years: 2.00    Pack years: 0.50    Types: Cigarettes  . Smokeless tobacco: Never Used  . Tobacco comment: "5 per day"  Vaping Use  . Vaping Use: Never used  Substance and Sexual Activity  . Alcohol use: No  . Drug use: No    Comment: denies use 08/28/14  . Sexual activity: Yes    Birth control/protection: Pill  Other Topics Concern  . Not on file  Social History Narrative   Lives with mother and brother, sister, daughter   Social Determinants of Health   Financial Resource Strain:   . Difficulty of Paying Living Expenses:   Food Insecurity:   . Worried About Programme researcher, broadcasting/film/video in the Last  Year:   . Ran Out of Food in the Last Year:   Transportation Needs:   . Freight forwarder (Medical):   Marland Kitchen Lack of Transportation (Non-Medical):   Physical Activity:   . Days of Exercise per Week:   . Minutes of Exercise per Session:   Stress:   . Feeling of Stress :   Social Connections:   . Frequency of Communication with Friends and Family:   . Frequency of Social Gatherings with Friends and Family:   . Attends Religious Services:   . Active Member of Clubs or Organizations:   . Attends Banker Meetings:   Marland Kitchen Marital Status:   Intimate Partner Violence:   . Fear of Current or Ex-Partner:   . Emotionally Abused:   Marland Kitchen Physically Abused:   .  Sexually Abused:    Family History  Problem Relation Age of Onset  . Diabetes Mother   . Alcohol abuse Mother   . COPD Mother   . Drug abuse Mother   . Cancer Maternal Grandmother        throat  . Cancer Maternal Grandfather        throat  . Diabetes Maternal Grandfather     OBJECTIVE:  Vitals:   05/16/20 1522  BP: (!) 155/107  Pulse: (!) 105  Resp: (!) 22  Temp: 98.6 F (37 C)  SpO2: 97%    General appearance: alert; well-appearing, nontoxic; speaking in full sentences and tolerating own secretions HEENT: NCAT; Ears: EACs clear, TMs pearly gray; Eyes: PERRL.  EOM grossly intact. Nose: nares patent without rhinorrhea, Throat: oropharynx clear, tonsils non erythematous or enlarged, uvula midline  Neck: supple without LAD Lungs: unlabored respirations, symmetrical air entry; cough: mild; no respiratory distress; CTAB Heart: regular rate and rhythm.   Skin: warm and dry Psychological: alert and cooperative; normal mood and affect  ASSESSMENT & PLAN:  1. Viral URI with cough     Meds ordered this encounter  Medications  . cetirizine (ZYRTEC) 10 MG tablet    Sig: Take 1 tablet (10 mg total) by mouth daily.    Dispense:  30 tablet    Refill:  0    Order Specific Question:   Supervising Provider    Answer:   Eustace Moore [[id number]]  . fluticasone (FLONASE) 50 MCG/ACT nasal spray    Sig: Place 2 sprays into both nostrils daily.    Dispense:  16 g    Refill:  0    Order Specific Question:   Supervising Provider    Answer:   Eustace Moore [[id number]]  . benzonatate (TESSALON) 100 MG capsule    Sig: Take 1 capsule (100 mg total) by mouth every 8 (eight) hours.    Dispense:  21 capsule    Refill:  0    Order Specific Question:   Supervising Provider    Answer:   Eustace Moore [[id number]]  . albuterol (VENTOLIN HFA) 108 (90 Base) MCG/ACT inhaler    Sig: Inhale 1-2 puffs into the lungs every 6 (six) hours as needed for wheezing or shortness of breath.     Dispense:  18 g    Refill:  0    Order Specific Question:   Supervising Provider    Answer:   Eustace Moore [[id number]]    Get plenty of rest and push fluids Tessalon Perles prescribed for cough zyrtec for nasal congestion, runny nose, and/or sore throat flonase for nasal congestion and runny nose Albuterol inhaler as  needed for shortness of breath and/or wheezing Use OTC medications like ibuprofen or tylenol as needed fever or pain Call or go to the ED if you have any new or worsening symptoms such as fever, worsening cough, shortness of breath, chest tightness, chest pain, turning blue, changes in mental status, etc...   Reviewed expectations re: course of current medical issues. Questions answered. Outlined signs and symptoms indicating need for more acute intervention. Patient verbalized understanding. After Visit Summary given.         Lestine Box, PA-C 05/16/20 1529

## 2020-05-29 ENCOUNTER — Other Ambulatory Visit: Payer: Self-pay | Admitting: Obstetrics & Gynecology

## 2020-06-02 ENCOUNTER — Emergency Department (HOSPITAL_COMMUNITY)
Admission: EM | Admit: 2020-06-02 | Discharge: 2020-06-02 | Discharge status: Against Medical Advice | Payer: Medicaid Other

## 2020-06-02 ENCOUNTER — Encounter: Payer: Self-pay | Admitting: Emergency Medicine

## 2020-06-02 ENCOUNTER — Ambulatory Visit
Admission: EM | Admit: 2020-06-02 | Discharge: 2020-06-02 | Disposition: A | Discharge status: Home / Self Care | Payer: Medicaid Other | Attending: Emergency Medicine | Admitting: Emergency Medicine

## 2020-06-02 ENCOUNTER — Other Ambulatory Visit: Payer: Self-pay

## 2020-06-02 DIAGNOSIS — H66002 Acute suppurative otitis media without spontaneous rupture of ear drum, left ear: Secondary | ICD-10-CM

## 2020-06-02 DIAGNOSIS — R42 Dizziness and giddiness: Secondary | ICD-10-CM

## 2020-06-02 LAB — POCT FASTING CBG KUC MANUAL ENTRY: POCT Glucose (KUC): 91 mg/dL (ref 70–99)

## 2020-06-02 MED ORDER — MECLIZINE HCL 25 MG PO TABS: 25.0000 mg | ORAL_TABLET | Freq: Three times a day (TID) | ORAL | 0 refills | Status: DC | PRN

## 2020-06-02 MED ORDER — AMOXICILLIN 500 MG PO CAPS: 500.0000 mg | ORAL_CAPSULE | Freq: Two times a day (BID) | ORAL | 0 refills | Status: DC

## 2020-06-02 NOTE — Discharge Instructions (Addendum)
Amoxicillin prescribed.  Take as directed and to completion Meclizine prescribed.  Take as directed for symptomatic relief.  This medication may make you drowsy so use with cautions while driving or operating heavy machinery. Follow up with PCP if symptoms persists Return or go to the ER if you have any new or worsening symptoms fever, chills, nausea, vomiting, passing out, chest pain, shortness of breath, weakness, numbness/ tingling, etc..Marland Kitchen

## 2020-06-02 NOTE — ED Provider Notes (Signed)
[facility]   [id number] 06/02/20 Arrival Time: 1231  CC: DIZZINESS  SUBJECTIVE:  Selena Barr is a 29 y.o. female who presents with complaint of dizziness that began this morning.  Does admit to recent URI.  Describes the dizziness as "the room spinning." States that it is constant.  Denies alleviating or aggravating factors.   Denies to previous symptoms.  Denies fever, chills, nausea, vomiting, hearing changes, tinnitus, ear pain, chest pain, syncope, SOB, weakness, slurred speech, memory or emotional changes, facial drooping/ asymmetry, incoordination, numbness or tingling, abdominal pain, changes in bowel or bladder habits.    ROS: As per HPI.  All other pertinent ROS negative.    Past Medical History:  Diagnosis Date  . Allergy   . Diabetes mellitus without complication ([facility])   . Hyperlipidemia 10/24/2016  . Tobacco abuse 09/22/2016   Past Surgical History:  Procedure Laterality Date  . WISDOM TOOTH EXTRACTION     Allergies  Allergen Reactions  . Solu-Medrol [Methylprednisolone Sodium Succ]     Rash, lip swelling    No current facility-administered medications on file prior to encounter.   Current Outpatient Medications on File Prior to Encounter  Medication Sig Dispense Refill  . ACCU-CHEK AVIVA PLUS test strip USE UP TO QID TO TEST BLOOD SUGAR AS DIRECTED  0  . ACCU-CHEK SOFTCLIX LANCETS lancets USE UP TO QID TO TEST BLOOD SUGAR  AS DIRECTED  0  . albuterol (VENTOLIN HFA) 108 (90 Base) MCG/ACT inhaler Inhale 1-2 puffs into the lungs every 6 (six) hours as needed for wheezing or shortness of breath. 18 g 0  . cetirizine (ZYRTEC) 10 MG tablet Take 1 tablet (10 mg total) by mouth daily. 30 tablet 0  . fluticasone (FLONASE) 50 MCG/ACT nasal spray Place 2 sprays into both nostrils daily. 16 g 0  . megestrol (MEGACE) 40 MG tablet TAKE 3 TABLETS BY MOUTH DAILY X 5 DAYS, TAKE 2 TABLETS DAILY X 5 DAYS, THEN TAKE 1 TABLET DAILY 45 tablet 11  . [DISCONTINUED]  amLODipine (NORVASC) 10 MG tablet Take 1 tablet (10 mg total) by mouth daily. (Patient not taking: Reported on 03/05/2019) 30 tablet 1  . [DISCONTINUED] hydrochlorothiazide (HYDRODIURIL) 25 MG tablet Take 1 tablet (25 mg total) by mouth daily. (Patient not taking: Reported on 03/05/2019) 30 tablet 0  . [DISCONTINUED] metFORMIN (GLUCOPHAGE) 1000 MG tablet Take 1 tablet (1,000 mg total) by mouth 2 (two) times daily with a meal. 180 tablet 3  . [DISCONTINUED] norethindrone (MICRONOR,CAMILA,ERRIN) 0.35 MG tablet Take 1 tablet (0.35 mg total) by mouth daily. 1 Package 11   Social History   Socioeconomic History  . Marital status: Single    Spouse name: Not on file  . Number of children: 1  . Years of education: 79  . Highest education level: Not on file  Occupational History    Comment: unemployed  Tobacco Use  . Smoking status: Current Every Day Smoker    Packs/day: 0.25    Years: 2.00    Pack years: 0.50    Types: Cigarettes  . Smokeless tobacco: Never Used  . Tobacco comment: "5 per day"  Vaping Use  . Vaping Use: Never used  Substance and Sexual Activity  . Alcohol use: No  . Drug use: No    Comment: denies use 08/28/14  . Sexual activity: Yes    Birth control/protection: Pill  Other Topics Concern  . Not on file  Social History Narrative   Lives with mother and brother, sister,  daughter   Social Determinants of Health   Financial Resource Strain:   . Difficulty of Paying Living Expenses:   Food Insecurity:   . Worried About Programme researcher, broadcasting/film/video in the Last Year:   . Barista in the Last Year:   Transportation Needs:   . Freight forwarder (Medical):   Marland Kitchen Lack of Transportation (Non-Medical):   Physical Activity:   . Days of Exercise per Week:   . Minutes of Exercise per Session:   Stress:   . Feeling of Stress :   Social Connections:   . Frequency of Communication with Friends and Family:   . Frequency of Social Gatherings with Friends and Family:   . Attends  Religious Services:   . Active Member of Clubs or Organizations:   . Attends Banker Meetings:   Marland Kitchen Marital Status:   Intimate Partner Violence:   . Fear of Current or Ex-Partner:   . Emotionally Abused:   Marland Kitchen Physically Abused:   . Sexually Abused:    Family History  Problem Relation Age of Onset  . Diabetes Mother   . Alcohol abuse Mother   . COPD Mother   . Drug abuse Mother   . Cancer Maternal Grandmother        throat  . Cancer Maternal Grandfather        throat  . Diabetes Maternal Grandfather     OBJECTIVE:  Vitals:   06/02/20 1240 06/02/20 1248  BP:  (!) 153/102  Pulse: 86   Resp: 18   Temp: 98.5 F (36.9 C)   TempSrc: Oral   SpO2: 96%   Weight: (!) 331 lb (150.1 kg)   Height: 5\' 2"  (1.575 m)     General appearance: alert; no distress Eyes: PERRLA; EOMI; conjunctiva normal HENT: normocephalic; atraumatic; EACs clear, RT TM pearly gray, LT TM erythematous; nasal mucosa normal; oral mucosa normal Neck: supple with FROM Lungs: clear to auscultation bilaterally Heart: regular rate and rhythm Extremities: no cyanosis or edema; symmetrical with no gross deformities Skin: warm and dry Neurologic: normal gait; strength and sensation intact about the upper and lower extremities; CN 2-12 grossly intact; finger to nose without difficulty; negative pronator drift Psychological: alert and cooperative; normal mood and affect  Labs:  Results for orders placed or performed during the hospital encounter of 06/02/20 (from the past 24 hour(s))  POCT CBG (manual entry)     Status: None   Collection Time: 06/02/20 12:49 PM  Result Value Ref Range   POCT Glucose ([facility]) 91 70 - 99 mg/dL    ASSESSMENT & PLAN:  1. Dizziness and giddiness   2. Non-recurrent acute suppurative otitis media of left ear without spontaneous rupture of tympanic membrane     Meds ordered this encounter  Medications  . amoxicillin (AMOXIL) 500 MG capsule    Sig: Take 1 capsule (500 mg  total) by mouth 2 (two) times daily for 10 days.    Dispense:  20 capsule    Refill:  0    Order Specific Question:   Supervising Provider    Answer:   06/04/20 Eustace Moore  . meclizine (ANTIVERT) 25 MG tablet    Sig: Take 1 tablet (25 mg total) by mouth 3 (three) times daily as needed for dizziness.    Dispense:  30 tablet    Refill:  0    Order Specific Question:   Supervising Provider    Answer:   [[id number]] [  [id number]]   Amoxicillin prescribed.  Take as directed and to completion Meclizine prescribed.  Take as directed for symptomatic relief.  This medication may make you drowsy so use with cautions while driving or operating heavy machinery. Follow up with PCP if symptoms persists Return or go to the ER if you have any new or worsening symptoms fever, chills, nausea, vomiting, passing out, chest pain, shortness of breath, weakness, numbness/ tingling, etc...  Reviewed expectations re: course of current medical issues. Questions answered. Outlined signs and symptoms indicating need for more acute intervention. Patient verbalized understanding. After Visit Summary given.    Rennis Harding, PA-C 06/02/20 1307

## 2020-06-02 NOTE — ED Triage Notes (Signed)
Reports headache and dizziness since 0800 this morning.

## 2020-06-03 ENCOUNTER — Encounter (HOSPITAL_COMMUNITY): Payer: Self-pay

## 2020-06-03 ENCOUNTER — Emergency Department (HOSPITAL_COMMUNITY)
Admission: EM | Admit: 2020-06-03 | Discharge: 2020-06-03 | Disposition: A | Discharge status: Home / Self Care | Payer: Medicaid Other | Attending: Emergency Medicine | Admitting: Emergency Medicine

## 2020-06-03 ENCOUNTER — Other Ambulatory Visit: Payer: Self-pay

## 2020-06-03 DIAGNOSIS — Z794 Long term (current) use of insulin: Secondary | ICD-10-CM | POA: Insufficient documentation

## 2020-06-03 DIAGNOSIS — M549 Dorsalgia, unspecified: Secondary | ICD-10-CM | POA: Diagnosis present

## 2020-06-03 DIAGNOSIS — M5441 Lumbago with sciatica, right side: Secondary | ICD-10-CM | POA: Diagnosis not present

## 2020-06-03 DIAGNOSIS — M5431 Sciatica, right side: Secondary | ICD-10-CM | POA: Insufficient documentation

## 2020-06-03 DIAGNOSIS — F17210 Nicotine dependence, cigarettes, uncomplicated: Secondary | ICD-10-CM | POA: Diagnosis not present

## 2020-06-03 DIAGNOSIS — E119 Type 2 diabetes mellitus without complications: Secondary | ICD-10-CM | POA: Insufficient documentation

## 2020-06-03 MED ORDER — DICLOFENAC SODIUM 75 MG PO TBEC: 75.0000 mg | DELAYED_RELEASE_TABLET | Freq: Two times a day (BID) | ORAL | 0 refills | Status: DC

## 2020-06-03 MED ORDER — METHOCARBAMOL 500 MG PO TABS: 500.0000 mg | ORAL_TABLET | Freq: Four times a day (QID) | ORAL | 0 refills | Status: DC

## 2020-06-03 NOTE — ED Provider Notes (Signed)
[facility] EMERGENCY DEPARTMENT Provider Note   CSN: [id number] Arrival date & time: 06/03/20  1536     History Chief Complaint  Patient presents with  . Back Pain    Selena Barr is a 29 y.o. female.  The history is provided by the patient. No language interpreter was used.  Back Pain Location:  Gluteal region Quality:  Aching Radiates to:  Does not radiate Pain severity:  Moderate Timing:  Constant Progression:  Worsening Chronicity:  New Relieved by:  Nothing Worsened by:  Nothing Ineffective treatments:  None tried Associated symptoms: no abdominal pain, no leg pain, no numbness and no tingling        Past Medical History:  Diagnosis Date  . Allergy   . Diabetes mellitus without complication (HCC)   . Hyperlipidemia 10/24/2016  . Tobacco abuse 09/22/2016    Patient Active Problem List   Diagnosis Date Noted  . 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 Rubella non-immune status, antepartum 2018/08/02  . Death of infant 08-01-2018  . Mixed hyperlipidemia 10/24/2016  . Type 2 diabetes mellitus (HCC) 09/22/2016  . Morbid obesity (HCC) 09/22/2016  . Tobacco abuse 09/22/2016    Past Surgical History:  Procedure Laterality Date  . WISDOM TOOTH EXTRACTION       OB History    Gravida  3   Para  3   Term  3   Preterm      AB      Living  2     SAB      TAB      Ectopic      Multiple      Live Births  3           Family History  Problem Relation Age of Onset  . Diabetes Mother   . Alcohol abuse Mother   . COPD Mother   . Drug abuse Mother   . Cancer Maternal Grandmother        throat  . Cancer Maternal Grandfather        throat  . Diabetes Maternal Grandfather     Social History   Tobacco Use  . Smoking status: Current Every Day Smoker    Packs/day: 0.25    Years: 2.00    Pack years: 0.50    Types: Cigarettes  . Smokeless tobacco: Never Used  . Tobacco comment: "5 per day"  Vaping Use  . Vaping Use: Never used    Substance Use Topics  . Alcohol use: No  . Drug use: No    Comment: denies use 08/28/14    Home Medications Prior to Admission medications   Medication Sig Start Date End Date Taking? Authorizing Provider  ACCU-CHEK AVIVA PLUS test strip USE UP TO QID TO TEST BLOOD SUGAR AS DIRECTED 05/18/18   [provider]  ACCU-CHEK SOFTCLIX LANCETS lancets USE UP TO QID TO TEST BLOOD SUGAR  AS DIRECTED 05/18/18   [provider]  albuterol (VENTOLIN HFA) 108 (90 Base) MCG/ACT inhaler Inhale 1-2 puffs into the lungs every 6 (six) hours as needed for wheezing or shortness of breath. 05/16/20   Wurst, [place], PA-C  amoxicillin (AMOXIL) 500 MG capsule Take 1 capsule (500 mg total) by mouth 2 (two) times daily for 10 days. 06/02/20 06/12/20  Wurst, Lowanda Foster, PA-C  cetirizine (ZYRTEC) 10 MG tablet Take 1 tablet (10 mg total) by mouth daily. 05/16/20   Wurst, [place], PA-C  fluticasone (FLONASE) 50 MCG/ACT nasal spray Place 2 sprays into both nostrils  daily. 05/16/20   Wurst, [place], PA-C  meclizine (ANTIVERT) 25 MG tablet Take 1 tablet (25 mg total) by mouth 3 (three) times daily as needed for dizziness. 06/02/20   Wurst, [place], PA-C  megestrol (MEGACE) 40 MG tablet TAKE 3 TABLETS BY MOUTH DAILY X 5 DAYS, TAKE 2 TABLETS DAILY X 5 DAYS, THEN TAKE 1 TABLET DAILY 05/30/20   Lazaro Arms, MD  amLODipine (NORVASC) 10 MG tablet Take 1 tablet (10 mg total) by mouth daily. Patient not taking: Reported on 03/05/2019 02/03/19 05/06/20  Cheral Marker, CNM  hydrochlorothiazide (HYDRODIURIL) 25 MG tablet Take 1 tablet (25 mg total) by mouth daily. Patient not taking: Reported on 03/05/2019 02/06/19 05/06/20  Cheral Marker, CNM  metFORMIN (GLUCOPHAGE) 1000 MG tablet Take 1 tablet (1,000 mg total) by mouth 2 (two) times daily with a meal. 05/09/18 03/06/20  Adline Potter, NP  norethindrone (MICRONOR,CAMILA,ERRIN) 0.35 MG tablet Take 1 tablet (0.35 mg total) by mouth daily. 02/03/19 03/06/20  Cheral Marker,  CNM    Allergies    Solu-medrol [methylprednisolone sodium succ]  Review of Systems   Review of Systems  Gastrointestinal: Negative for abdominal pain.  Musculoskeletal: Positive for back pain.  Neurological: Negative for tingling and numbness.  All other systems reviewed and are negative.   Physical Exam Updated Vital Signs BP (!) 155/105 (BP Location: Right Arm)   Pulse 86   Temp 98.5 F (36.9 C) (Oral)   Resp 18   Ht 5\' 2"  (1.575 m)   Wt (!) 150.1 kg   LMP 05/06/2020   SpO2 97%   BMI 60.54 kg/m   Physical Exam Vitals reviewed.  Constitutional:      Appearance: Normal appearance.  Cardiovascular:     Rate and Rhythm: Normal rate.     Pulses: Normal pulses.  Pulmonary:     Effort: Pulmonary effort is normal.  Abdominal:     General: Abdomen is flat.  Musculoskeletal:     Comments: Tender right buttock down back of leg, nv and ns intact  Skin:    General: Skin is warm.  Neurological:     General: No focal deficit present.     Mental Status: She is alert.  Psychiatric:        Mood and Affect: Mood normal.     ED Results / Procedures / Treatments   Labs (all labs ordered are listed, but only abnormal results are displayed) Labs Reviewed - No data to display  EKG None  Radiology No results found.  Procedures Procedures (including critical care time)  Medications Ordered in ED Medications - No data to display  ED Course  I have reviewed the triage vital signs and the nursing notes.  Pertinent labs & imaging results that were available during my care of the patient were reviewed by me and considered in my medical decision making (see chart for details).    MDM Rules/Calculators/A&P                          MDM:  Pt's pain follow distribution of sciatic nerve.  Pt is daibetic.  Pt has had problems with solumedrol/  I will try voltaren and robaxin.  Pt given referrals for primary care mangement with hypertension  Final Clinical Impression(s) / ED  Diagnoses Final diagnoses:  Sciatica, right side    Rx / DC Orders ED Discharge Orders         Ordered  diclofenac (VOLTAREN) 75 MG EC tablet  2 times daily     Discontinue  Reprint     06/03/20 1849    methocarbamol (ROBAXIN) 500 MG tablet  4 times daily     Discontinue  Reprint     06/03/20 1849        An After Visit Summary was printed and given to the patient.    Elson Areas, [place] 06/03/20 1850    Bethann Berkshire, MD 06/04/20 1224

## 2020-06-03 NOTE — ED Triage Notes (Signed)
Pt presents to ED with lower back pain x 2 weeks.

## 2020-06-11 ENCOUNTER — Other Ambulatory Visit: Payer: Self-pay

## 2020-06-11 ENCOUNTER — Ambulatory Visit (INDEPENDENT_AMBULATORY_CARE_PROVIDER_SITE_OTHER): Payer: Medicaid Other | Admitting: Women's Health

## 2020-06-11 ENCOUNTER — Encounter: Payer: Self-pay | Admitting: Women's Health

## 2020-06-11 DIAGNOSIS — I10 Essential (primary) hypertension: Secondary | ICD-10-CM | POA: Insufficient documentation

## 2020-06-11 MED ORDER — AMLODIPINE BESYLATE 10 MG PO TABS: 10.0000 mg | ORAL_TABLET | Freq: Every day | ORAL | 3 refills | Status: DC

## 2020-06-11 NOTE — Progress Notes (Signed)
° °  GYN VISIT Patient name: Selena Barr MRN [id number]  Date of birth: 01-22-1991 Chief Complaint:   Hypertension (headaches daily)  History of Present Illness:   Selena Barr is a 29 y.o. G16P3002 African American female being seen today for elevated bp's and daily headaches. Had postpartum HTN Feb 2020 treated with norvasc 10mg  and HCTZ, bp's had normalized off meds by April 2020 (124/78). Has had multiple ED visits since and bp's all elevated. Does not have PCP.  Depression screen [facility] 2/9 07/28/2018 03/15/2018 11/08/2017 07/12/2017 02/28/2017  Decreased Interest 0 0 0 0 0  Down, Depressed, Hopeless 0 0 0 0 0  PHQ - 2 Score 0 0 0 0 0  Altered sleeping 3 3 - - -  Tired, decreased energy 3 3 - - -  Change in appetite 3 3 - - -  Feeling bad or failure about yourself  0 0 - - -  Trouble concentrating 0 0 - - -  Moving slowly or fidgety/restless 0 0 - - -  Suicidal thoughts 0 0 - - -  PHQ-9 Score 9 9 - - -  Difficult doing work/chores - Somewhat difficult - - -    Patient's last menstrual period was 06/07/2020. Last pap 08/19/18. Results were:  normal Review of Systems:   Pertinent items are noted in HPI Denies fever/chills, dizziness, headaches, visual disturbances, fatigue, shortness of breath, chest pain, abdominal pain, vomiting, abnormal vaginal discharge/itching/odor/irritation, problems with periods, bowel movements, urination, or intercourse unless otherwise stated above.  Pertinent History Reviewed:  Reviewed past medical,surgical, social, obstetrical and family history.  Reviewed problem list, medications and allergies. Physical Assessment:   Vitals:   06/11/20 1259  BP: (!) 156/103  Pulse: (!) 101  Weight: (!) 330 lb 3.2 oz (149.8 kg)  Height: 5\' 3"  (1.6 m)  Body mass index is 58.49 kg/m.       Physical Examination:   General appearance: alert, well appearing, and in no distress  Mental status: alert, oriented to person, place, and time  Skin: warm & dry    Cardiovascular: normal heart rate noted  Respiratory: normal respiratory effort, no distress  Abdomen: soft, non-tender   Pelvic: examination not indicated  Extremities: no edema   Chaperone: n/a    No results found for this or any previous visit (from the past 24 hour(s)).  Assessment & Plan:  1) [facility]> rx norvasc 10mg , f/u Tues for bp check w/ nurse, take meds at least 1hr prior to appt.   08/12/20:  Meds ordered this encounter  Medications   amLODipine (NORVASC) 10 MG tablet    Sig: Take 1 tablet (10 mg total) by mouth daily.    Dispense:  90 tablet    Refill:  3    Order Specific Question:   Supervising Provider    Answer:   , LUTHER H [[id number]]    No orders of the defined types were placed in this encounter.   Return for tues bp check w/ nurse.  CNM, [facility] 06/11/2020 1:43 PM

## 2020-06-15 ENCOUNTER — Ambulatory Visit (INDEPENDENT_AMBULATORY_CARE_PROVIDER_SITE_OTHER): Payer: Medicaid Other | Admitting: *Deleted

## 2020-06-15 ENCOUNTER — Other Ambulatory Visit: Payer: Self-pay | Admitting: Women's Health

## 2020-06-15 ENCOUNTER — Encounter: Payer: Self-pay | Admitting: *Deleted

## 2020-06-15 VITALS — BP 159/105 | HR 90 | Ht 62.0 in | Wt 333.0 lb

## 2020-06-15 DIAGNOSIS — Z0130 Encounter for examination of blood pressure without abnormal findings: Secondary | ICD-10-CM

## 2020-06-15 DIAGNOSIS — I10 Essential (primary) hypertension: Secondary | ICD-10-CM

## 2020-06-15 MED ORDER — BENAZEPRIL HCL 5 MG PO TABS: 5.0000 mg | ORAL_TABLET | Freq: Every day | ORAL | 3 refills | Status: DC

## 2020-06-15 NOTE — Progress Notes (Addendum)
   NURSE VISIT- BLOOD PRESSURE CHECK  SUBJECTIVE:  Selena Barr is a 29 y.o. G80P3002 female here for BP check. She is a GYN patient    HYPERTENSION ROS:   GYN patient: . Taking medicines as instructed yes . Headaches  Yes . Chest pain No . Shortness of breath No . Swelling in legs/ankles No  OBJECTIVE:  BP (!) 149/103 (BP Location: Left Wrist, Patient Position: Sitting, Cuff Size: Normal)   Pulse 94   Ht 5\' 2"  (1.575 m)   Wt (!) 333 lb (151 kg)   LMP 06/07/2020   BMI 60.91 kg/m   Appearance alert, well appearing, and in no distress.  ASSESSMENT: GYN  blood pressure check  PLAN: Discussed with 08/08/2020, CNM, [facility]   Recommendations: new prescription will be sent   Follow-up: July 20   July 22  06/15/2020 11:10 AM   Chart reviewed for nurse visit. Agree with plan of care. Started on norvasc 10mg  on 7/9. Has taken this am. Home bp's still elevated c/w today's reading. Add benazepril 5mg  daily- take both at night, f/u in 1wk. Check home bp's, send mychart message w/ readings on Friday. Gave list of local PCPs to establish care.  9/9, 06/15/2020 11:33 AM

## 2020-07-20 DIAGNOSIS — Z20828 Contact with and (suspected) exposure to other viral communicable diseases: Secondary | ICD-10-CM | POA: Diagnosis not present

## 2020-07-20 DIAGNOSIS — U071 COVID-19: Secondary | ICD-10-CM | POA: Diagnosis not present

## 2020-07-28 DIAGNOSIS — Z20828 Contact with and (suspected) exposure to other viral communicable diseases: Secondary | ICD-10-CM | POA: Diagnosis not present

## 2020-07-28 DIAGNOSIS — U071 COVID-19: Secondary | ICD-10-CM | POA: Diagnosis not present

## 2020-08-03 DIAGNOSIS — Z20828 Contact with and (suspected) exposure to other viral communicable diseases: Secondary | ICD-10-CM | POA: Diagnosis not present

## 2020-08-03 DIAGNOSIS — U071 COVID-19: Secondary | ICD-10-CM | POA: Diagnosis not present

## 2020-08-11 DIAGNOSIS — U071 COVID-19: Secondary | ICD-10-CM | POA: Diagnosis not present

## 2020-08-11 DIAGNOSIS — Z20828 Contact with and (suspected) exposure to other viral communicable diseases: Secondary | ICD-10-CM | POA: Diagnosis not present

## 2020-08-16 DIAGNOSIS — Z20828 Contact with and (suspected) exposure to other viral communicable diseases: Secondary | ICD-10-CM | POA: Diagnosis not present

## 2020-08-16 DIAGNOSIS — U071 COVID-19: Secondary | ICD-10-CM | POA: Diagnosis not present

## 2020-08-31 DIAGNOSIS — Z20828 Contact with and (suspected) exposure to other viral communicable diseases: Secondary | ICD-10-CM | POA: Diagnosis not present

## 2020-08-31 DIAGNOSIS — U071 COVID-19: Secondary | ICD-10-CM | POA: Diagnosis not present

## 2020-09-07 DIAGNOSIS — Z20828 Contact with and (suspected) exposure to other viral communicable diseases: Secondary | ICD-10-CM | POA: Diagnosis not present

## 2020-09-07 DIAGNOSIS — U071 COVID-19: Secondary | ICD-10-CM | POA: Diagnosis not present

## 2020-09-14 DIAGNOSIS — Z20828 Contact with and (suspected) exposure to other viral communicable diseases: Secondary | ICD-10-CM | POA: Diagnosis not present

## 2020-09-14 DIAGNOSIS — U071 COVID-19: Secondary | ICD-10-CM | POA: Diagnosis not present

## 2020-09-21 DIAGNOSIS — Z20828 Contact with and (suspected) exposure to other viral communicable diseases: Secondary | ICD-10-CM | POA: Diagnosis not present

## 2020-09-21 DIAGNOSIS — U071 COVID-19: Secondary | ICD-10-CM | POA: Diagnosis not present

## 2020-09-28 DIAGNOSIS — U071 COVID-19: Secondary | ICD-10-CM | POA: Diagnosis not present

## 2020-09-28 DIAGNOSIS — Z20828 Contact with and (suspected) exposure to other viral communicable diseases: Secondary | ICD-10-CM | POA: Diagnosis not present

## 2020-10-05 DIAGNOSIS — U071 COVID-19: Secondary | ICD-10-CM | POA: Diagnosis not present

## 2020-10-05 DIAGNOSIS — Z20828 Contact with and (suspected) exposure to other viral communicable diseases: Secondary | ICD-10-CM | POA: Diagnosis not present

## 2020-10-12 DIAGNOSIS — U071 COVID-19: Secondary | ICD-10-CM | POA: Diagnosis not present

## 2020-10-12 DIAGNOSIS — Z20828 Contact with and (suspected) exposure to other viral communicable diseases: Secondary | ICD-10-CM | POA: Diagnosis not present

## 2020-10-19 DIAGNOSIS — Z20828 Contact with and (suspected) exposure to other viral communicable diseases: Secondary | ICD-10-CM | POA: Diagnosis not present

## 2020-10-19 DIAGNOSIS — U071 COVID-19: Secondary | ICD-10-CM | POA: Diagnosis not present

## 2020-10-22 ENCOUNTER — Ambulatory Visit
Admission: EM | Admit: 2020-10-22 | Discharge: 2020-10-22 | Disposition: A | Discharge status: Home / Self Care | Payer: Medicaid Other

## 2020-10-22 ENCOUNTER — Other Ambulatory Visit: Payer: Self-pay

## 2020-10-22 DIAGNOSIS — H44811 Hemophthalmos, right eye: Secondary | ICD-10-CM | POA: Diagnosis not present

## 2020-10-22 NOTE — ED Triage Notes (Signed)
Pt presents with c/o headaches for past few days and then developed busted blood vessel in right eye

## 2020-10-22 NOTE — ED Provider Notes (Addendum)
[facility]ARE [facility]   [id number] 10/22/20 Arrival Time: 1543  Chief Complaint  Patient presents with  . Headache     SUBJECTIVE:  Selena Barr is a 29 y.o. female presented to the urgent care for complaint of headache for the past few days and developed a busted blood vessel in the right eye.  Denies a precipitating event, trauma, or close contacts with similar symptoms.  Has not tried any OTC drops.  Denies of aggravating factors.  Denies similar symptoms in the past.  Denies fever, chills, nausea, vomiting, eye pain, painful eye movements, halos, discharge, itching, vision changes, double vision, FB sensation, periorbital erythema.     Denies contact lens use.    ROS: As per HPI.  All other pertinent ROS negative.     Past Medical History:  Diagnosis Date  . Allergy   . Diabetes mellitus without complication (HCC)   . Hyperlipidemia 10/24/2016  . Hypertension   . Tobacco abuse 09/22/2016   Past Surgical History:  Procedure Laterality Date  . WISDOM TOOTH EXTRACTION     Allergies  Allergen Reactions  . Solu-Medrol [Methylprednisolone Sodium Succ]     Rash, lip swelling    No current facility-administered medications on file prior to encounter.   Current Outpatient Medications on File Prior to Encounter  Medication Sig Dispense Refill  . albuterol (VENTOLIN HFA) 108 (90 Base) MCG/ACT inhaler Inhale 1-2 puffs into the lungs every 6 (six) hours as needed for wheezing or shortness of breath. 18 g 0  . amLODipine (NORVASC) 10 MG tablet Take 1 tablet (10 mg total) by mouth daily. 90 tablet 3  . benazepril (LOTENSIN) 5 MG tablet Take 1 tablet (5 mg total) by mouth daily. 30 tablet 3  . cetirizine (ZYRTEC) 10 MG tablet Take 1 tablet (10 mg total) by mouth daily. 30 tablet 0  . megestrol (MEGACE) 40 MG tablet TAKE 3 TABLETS BY MOUTH DAILY X 5 DAYS, TAKE 2 TABLETS DAILY X 5 DAYS, THEN TAKE 1 TABLET DAILY 45 tablet 11  . [DISCONTINUED] hydrochlorothiazide (HYDRODIURIL) 25  MG tablet Take 1 tablet (25 mg total) by mouth daily. (Patient not taking: Reported on 03/05/2019) 30 tablet 0  . [DISCONTINUED] metFORMIN (GLUCOPHAGE) 1000 MG tablet Take 1 tablet (1,000 mg total) by mouth 2 (two) times daily with a meal. 180 tablet 3  . [DISCONTINUED] norethindrone (MICRONOR,CAMILA,ERRIN) 0.35 MG tablet Take 1 tablet (0.35 mg total) by mouth daily. 1 Package 11   Social History   Socioeconomic History  . Marital status: Single    Spouse name: Not on file  . Number of children: 1  . Years of education: 41  . Highest education level: Not on file  Occupational History    Comment: unemployed  Tobacco Use  . Smoking status: Current Every Day Smoker    Packs/day: 0.25    Years: 2.00    Pack years: 0.50    Types: Cigarettes  . Smokeless tobacco: Never Used  . Tobacco comment: "5 per day"  Vaping Use  . Vaping Use: Never used  Substance and Sexual Activity  . Alcohol use: No  . Drug use: No    Comment: denies use 08/28/14  . Sexual activity: Yes    Birth control/protection: None  Other Topics Concern  . Not on file  Social History Narrative   Lives with mother and brother, sister, daughter   Social Determinants of Health   Financial Resource Strain:   . Difficulty of Paying Living Expenses: Not on  file  Food Insecurity:   . Worried About Programme researcher, broadcasting/film/video in the Last Year: Not on file  . Ran Out of Food in the Last Year: Not on file  Transportation Needs:   . Lack of Transportation (Medical): Not on file  . Lack of Transportation (Non-Medical): Not on file  Physical Activity:   . Days of Exercise per Week: Not on file  . Minutes of Exercise per Session: Not on file  Stress:   . Feeling of Stress : Not on file  Social Connections:   . Frequency of Communication with Friends and Family: Not on file  . Frequency of Social Gatherings with Friends and Family: Not on file  . Attends Religious Services: Not on file  . Active Member of Clubs or Organizations:  Not on file  . Attends Banker Meetings: Not on file  . Marital Status: Not on file  Intimate Partner Violence:   . Fear of Current or Ex-Partner: Not on file  . Emotionally Abused: Not on file  . Physically Abused: Not on file  . Sexually Abused: Not on file   Family History  Problem Relation Age of Onset  . Diabetes Mother   . Alcohol abuse Mother   . COPD Mother   . Drug abuse Mother   . Cancer Maternal Grandmother        throat  . Cancer Maternal Grandfather        throat  . Diabetes Maternal Grandfather     OBJECTIVE:    Visual Acuity  Right Eye Distance:   Left Eye Distance:   Bilateral Distance:    Right Eye Near:   Left Eye Near:    Bilateral Near:      Vitals:   10/22/20 1603 10/22/20 1606  BP: 139/84 139/84  Pulse: 88 88  Resp: 20 18  Temp: 98.4 F (36.9 C) 98.4 F (36.9 C)  SpO2: 95% 95%    Physical Exam Vitals and nursing note reviewed.  Constitutional:      General: She is not in acute distress.    Appearance: Normal appearance. She is normal weight. She is not ill-appearing, toxic-appearing or diaphoretic.  HENT:     Head: Normocephalic.  Eyes:     General: Lids are normal.        Right eye: No foreign body, discharge or hordeolum.        Left eye: No foreign body, discharge or hordeolum.     Conjunctiva/sclera:     Right eye: Hemorrhage present.  Cardiovascular:     Rate and Rhythm: Normal rate and regular rhythm.     Pulses: Normal pulses.     Heart sounds: Normal heart sounds. No murmur heard.  No friction rub. No gallop.   Pulmonary:     Effort: Pulmonary effort is normal. No respiratory distress.     Breath sounds: Normal breath sounds. No stridor. No wheezing, rhonchi or rales.  Chest:     Chest wall: No tenderness.  Neurological:     Mental Status: She is alert and oriented to person, place, and time.       ASSESSMENT & PLAN:  1. Bleeding of eye, right     No orders of the defined types were placed in  this encounter.    Discharge instructions  Use OTC systane or genteal gel eye drops at night as needed for symptomatic relief Use OTC ibuprofen or tylenol as needed for pain relief Return here or follow up  with ophthamolgy if symptoms persists or worsen such as fever, chills, redness, swelling, eye pain, painful eye movements, vision changes, etc...  Reviewed expectations re: course of current medical issues. Questions answered. Outlined signs and symptoms indicating need for more acute intervention. Patient verbalized understanding. After Visit Summary given.   Durward Parcel, FNP 10/22/20 1654    Durward Parcel, FNP 10/22/20 1655

## 2020-10-22 NOTE — Discharge Instructions (Addendum)
Use OTC systane or genteal gel eye drops at night as needed for symptomatic relief Use OTC ibuprofen or tylenol as needed for pain relief Return here or follow up with ophthamolgy if symptoms persists or worsen such as fever, chills, redness, swelling, eye pain, painful eye movements, vision changes, etc..Marland Kitchen

## 2020-10-26 DIAGNOSIS — Z20828 Contact with and (suspected) exposure to other viral communicable diseases: Secondary | ICD-10-CM | POA: Diagnosis not present

## 2020-10-26 DIAGNOSIS — U071 COVID-19: Secondary | ICD-10-CM | POA: Diagnosis not present

## 2020-11-02 DIAGNOSIS — Z20828 Contact with and (suspected) exposure to other viral communicable diseases: Secondary | ICD-10-CM | POA: Diagnosis not present

## 2020-11-02 DIAGNOSIS — U071 COVID-19: Secondary | ICD-10-CM | POA: Diagnosis not present

## 2020-11-09 DIAGNOSIS — U071 COVID-19: Secondary | ICD-10-CM | POA: Diagnosis not present

## 2020-11-09 DIAGNOSIS — Z20828 Contact with and (suspected) exposure to other viral communicable diseases: Secondary | ICD-10-CM | POA: Diagnosis not present

## 2020-11-16 DIAGNOSIS — Z20828 Contact with and (suspected) exposure to other viral communicable diseases: Secondary | ICD-10-CM | POA: Diagnosis not present

## 2020-11-16 DIAGNOSIS — U071 COVID-19: Secondary | ICD-10-CM | POA: Diagnosis not present

## 2020-11-23 DIAGNOSIS — U071 COVID-19: Secondary | ICD-10-CM | POA: Diagnosis not present

## 2020-11-23 DIAGNOSIS — Z20828 Contact with and (suspected) exposure to other viral communicable diseases: Secondary | ICD-10-CM | POA: Diagnosis not present

## 2020-11-30 ENCOUNTER — Ambulatory Visit: Admit: 2020-11-30 | Discharge status: Against Medical Advice | Payer: Medicaid Other

## 2020-12-01 DIAGNOSIS — U071 COVID-19: Secondary | ICD-10-CM | POA: Diagnosis not present

## 2020-12-01 DIAGNOSIS — Z20828 Contact with and (suspected) exposure to other viral communicable diseases: Secondary | ICD-10-CM | POA: Diagnosis not present

## 2020-12-01 DIAGNOSIS — Z5321 Procedure and treatment not carried out due to patient leaving prior to being seen by health care provider: Secondary | ICD-10-CM | POA: Diagnosis not present

## 2020-12-02 ENCOUNTER — Telehealth: Payer: Self-pay

## 2020-12-02 NOTE — Telephone Encounter (Signed)
Transition Care Management Follow-up Telephone Call  Date of discharge and from where: 12/01/2020 [facility] ED  How have you been since you were released from the hospital? Doing good.   Any questions or concerns? No  Items Reviewed:  Did the pt receive and understand the discharge instructions provided? Yes   Medications obtained and verified? No   Other? No   Any new allergies since your discharge? No   Dietary orders reviewed? Yes  Do you have support at home? Yes    Functional Questionnaire: (I = Independent and D = Dependent) ADLs: I  Bathing/Dressing- I  Meal Prep- I  Eating- I  Maintaining continence- I  Transferring/Ambulation- I  Managing Meds- I  Follow up appointments reviewed:   PCP Hospital f/u appt confirmed? Yes  Scheduled to see [facility] on 12/23/20 @ 1:30pm.  Specialist Hospital f/u appt confirmed? No    Are transportation arrangements needed? No   If their condition worsens, is the pt aware to call PCP or go to the Emergency Dept.? Yes  Was the patient provided with contact information for the PCP's office or ED? Yes  Was to pt encouraged to call back with questions or concerns? Yes

## 2020-12-23 ENCOUNTER — Encounter: Payer: Medicaid Other | Admitting: Nurse Practitioner

## 2020-12-23 NOTE — Progress Notes (Signed)
Erroneous encounter

## 2021-07-14 ENCOUNTER — Other Ambulatory Visit: Payer: Self-pay | Admitting: Obstetrics & Gynecology

## 2021-09-14 ENCOUNTER — Other Ambulatory Visit: Payer: Self-pay

## 2021-09-14 ENCOUNTER — Ambulatory Visit
Admission: EM | Admit: 2021-09-14 | Discharge: 2021-09-14 | Disposition: A | Discharge status: Home / Self Care | Payer: Medicaid Other | Attending: Family Medicine | Admitting: Family Medicine

## 2021-09-14 DIAGNOSIS — J4521 Mild intermittent asthma with (acute) exacerbation: Secondary | ICD-10-CM | POA: Diagnosis not present

## 2021-09-14 DIAGNOSIS — J069 Acute upper respiratory infection, unspecified: Secondary | ICD-10-CM

## 2021-09-14 DIAGNOSIS — R52 Pain, unspecified: Secondary | ICD-10-CM

## 2021-09-14 MED ORDER — ALBUTEROL SULFATE HFA 108 (90 BASE) MCG/ACT IN AERS: 1.0000 | INHALATION_SPRAY | Freq: Four times a day (QID) | RESPIRATORY_TRACT | 0 refills | Status: DC | PRN

## 2021-09-14 MED ORDER — PROMETHAZINE-DM 6.25-15 MG/5ML PO SYRP: 5.0000 mL | ORAL_SOLUTION | Freq: Four times a day (QID) | ORAL | 0 refills | Status: DC | PRN

## 2021-09-14 MED ORDER — FLUTICASONE PROPIONATE 50 MCG/ACT NA SUSP: 1.0000 | Freq: Two times a day (BID) | NASAL | 2 refills | Status: DC

## 2021-09-14 NOTE — ED Provider Notes (Signed)
[facility]    CSN: [id number] Arrival date & time: 09/14/21  0848      History   Chief Complaint Chief Complaint  Patient presents with   Cough   Sore Throat    HPI Selena Barr is a 30 y.o. female.   Patient presenting today with 2-day history of worsening cough, congestion, body aches, sore throat, fatigue.  Now noticing some chest tightness, wheezing additionally.  Denies abdominal pain, nausea vomiting diarrhea, chest pain, shortness of breath, high fevers.  So far taking over-the-counter cold and congestion medications with minimal relief.  Daughter now also sick with similar symptoms.  Does have a history of seasonal allergies and asthma.  Has not had an inhaler for quite some time.  Past Medical History:  Diagnosis Date   Allergy    Diabetes mellitus without complication (HCC)    Hyperlipidemia 10/24/2016   Hypertension    Tobacco abuse 09/22/2016    Patient Active Problem List   Diagnosis Date Noted   Chronic hypertension 06/11/2020   Postpartum hypertension February 07, 2019   Death of infant Jul 27, 2018   Mixed hyperlipidemia 10/24/2016   Type 2 diabetes mellitus (HCC) 09/22/2016   Morbid obesity (HCC) 09/22/2016   Tobacco abuse 09/22/2016    Past Surgical History:  Procedure Laterality Date   WISDOM TOOTH EXTRACTION      OB History     Gravida  3   Para  3   Term  3   Preterm      AB      Living  2      SAB      IAB      Ectopic      Multiple      Live Births  3            Home Medications    Prior to Admission medications   Medication Sig Start Date End Date Taking? Authorizing Provider  fluticasone (FLONASE) 50 MCG/ACT nasal spray Place 1 spray into both nostrils 2 (two) times daily. 09/14/21  Yes Particia Nearing, PA-C  promethazine-dextromethorphan (PROMETHAZINE-DM) 6.25-15 MG/5ML syrup Take 5 mLs by mouth 4 (four) times daily as needed for cough. 09/14/21  Yes Particia Nearing, PA-C  albuterol  (VENTOLIN HFA) 108 (90 Base) MCG/ACT inhaler Inhale 1-2 puffs into the lungs every 6 (six) hours as needed for wheezing or shortness of breath. 09/14/21   Particia Nearing, PA-C  amLODipine (NORVASC) 10 MG tablet Take 1 tablet (10 mg total) by mouth daily. 06/11/20   Cheral Marker, CNM  benazepril (LOTENSIN) 5 MG tablet Take 1 tablet (5 mg total) by mouth daily. 06/15/20   Cheral Marker, CNM  cetirizine (ZYRTEC) 10 MG tablet Take 1 tablet (10 mg total) by mouth daily. 05/16/20   Wurst, [place], PA-C  megestrol (MEGACE) 40 MG tablet TAKE 3 TABLETS BY MOUTH DAILY X 5 DAYS, TAKE 2 TABLETS DAILY X 5 DAYS, THEN TAKE 1 TABLET DAILY 07/15/21   Lazaro Arms, MD  hydrochlorothiazide (HYDRODIURIL) 25 MG tablet Take 1 tablet (25 mg total) by mouth daily. Patient not taking: Reported on 03/05/2019 02/06/19 05/06/20  Cheral Marker, CNM  metFORMIN (GLUCOPHAGE) 1000 MG tablet Take 1 tablet (1,000 mg total) by mouth 2 (two) times daily with a meal. 05/09/18 03/06/20  Adline Potter, NP  norethindrone (MICRONOR,CAMILA,ERRIN) 0.35 MG tablet Take 1 tablet (0.35 mg total) by mouth daily. Feb 07, 2019 03/06/20  Cheral Marker, CNM    Family History  Family History  Problem Relation Age of Onset   Diabetes Mother    Alcohol abuse Mother    COPD Mother    Drug abuse Mother    Cancer Maternal Grandmother        throat   Cancer Maternal Grandfather        throat   Diabetes Maternal Grandfather     Social History Social History   Tobacco Use   Smoking status: Every Day    Packs/day: 0.25    Years: 2.00    Pack years: 0.50    Types: Cigarettes   Smokeless tobacco: Never   Tobacco comments:    "5 per day"  Vaping Use   Vaping Use: Never used  Substance Use Topics   Alcohol use: No   Drug use: No    Comment: denies use 08/28/14     Allergies   Solu-medrol [methylprednisolone sodium succ]   Review of Systems Review of Systems Per HPI  Physical Exam Triage Vital Signs ED Triage  Vitals  Enc Vitals Group     BP 09/14/21 0953 129/88     Pulse Rate 09/14/21 0953 96     Resp 09/14/21 0953 18     Temp 09/14/21 0953 99.1 F (37.3 C)     Temp Source 09/14/21 0953 Oral     SpO2 09/14/21 0953 97 %     Weight --      Height --      Head Circumference --      Peak Flow --      Pain Score 09/14/21 0954 7     Pain Loc --      Pain Edu? --      Excl. in GC? --    No data found.  Updated Vital Signs BP 129/88 (BP Location: Right Arm)   Pulse 96   Temp 99.1 F (37.3 C) (Oral)   Resp 18   LMP 09/13/2021 (Exact Date)   SpO2 97%   Visual Acuity Right Eye Distance:   Left Eye Distance:   Bilateral Distance:    Right Eye Near:   Left Eye Near:    Bilateral Near:     Physical Exam Vitals and nursing note reviewed.  Constitutional:      Appearance: Normal appearance. She is not ill-appearing.  HENT:     Head: Atraumatic.     Right Ear: Tympanic membrane normal.     Left Ear: Tympanic membrane normal.     Nose: Rhinorrhea present.     Mouth/Throat:     Mouth: Mucous membranes are moist.     Pharynx: Posterior oropharyngeal erythema present.  Eyes:     Extraocular Movements: Extraocular movements intact.     Conjunctiva/sclera: Conjunctivae normal.  Cardiovascular:     Rate and Rhythm: Normal rate and regular rhythm.     Heart sounds: Normal heart sounds.  Pulmonary:     Effort: Pulmonary effort is normal.     Breath sounds: Wheezing present. No rales.  Abdominal:     General: Bowel sounds are normal. There is no distension.     Palpations: Abdomen is soft.     Tenderness: There is no abdominal tenderness. There is no guarding.  Musculoskeletal:        General: Normal range of motion.     Cervical back: Normal range of motion and neck supple.  Skin:    General: Skin is warm and dry.  Neurological:     Mental Status: She is alert  and oriented to person, place, and time.  Psychiatric:        Mood and Affect: Mood normal.        Thought Content:  Thought content normal.        Judgment: Judgment normal.     UC Treatments / Results  Labs (all labs ordered are listed, but only abnormal results are displayed) Labs Reviewed  COVID-19, FLU A+B NAA    EKG   Radiology No results found.  Procedures Procedures (including critical care time)  Medications Ordered in UC Medications - No data to display  Initial Impression / Assessment and Plan / UC Course  I have reviewed the triage vital signs and the nursing notes.  Pertinent labs & imaging results that were available during my care of the patient were reviewed by me and considered in my medical decision making (see chart for details).     Overall fairly well-appearing with stable vital signs. Will test for COVID and flu today, await these results.  We will send  albuterol inhaler, Phenergan DM, Flonase and discussed supportive over-the-counter medications and home care.  We will forego IM or oral steroids at this time given her history of allergic reaction to Solu-Medrol.  Work note given, Technical brewer reviewed.  Return for acutely worsening symptoms.  Final Clinical Impressions(s) / UC Diagnoses   Final diagnoses:  Viral URI with cough  Mild intermittent asthma with acute exacerbation  Generalized body aches   Discharge Instructions   None    ED Prescriptions     Medication Sig Dispense Auth. Provider   albuterol (VENTOLIN HFA) 108 (90 Base) MCG/ACT inhaler Inhale 1-2 puffs into the lungs every 6 (six) hours as needed for wheezing or shortness of breath. 18 g Roosvelt Maser [place], [place]   promethazine-dextromethorphan (PROMETHAZINE-DM) 6.25-15 MG/5ML syrup Take 5 mLs by mouth 4 (four) times daily as needed for cough. 100 mL Particia Nearing, PA-C   fluticasone [facility]) 50 MCG/ACT nasal spray Place 1 spray into both nostrils 2 (two) times daily. 16 g Particia Nearing, [place]      PDMP not reviewed this encounter.   Particia Nearing,  [place] 09/14/21 1119

## 2021-09-14 NOTE — ED Triage Notes (Signed)
Two day h/o cough, congestion and sore throat that worsened as of last night. Has been taking nyquil without relief. Denies n/v/d. Pt is not covid vaccinated.

## 2021-09-15 LAB — COVID-19, FLU A+B NAA
Influenza A, NAA: NOT DETECTED
Influenza B, NAA: NOT DETECTED
SARS-CoV-2, NAA: NOT DETECTED

## 2021-11-18 ENCOUNTER — Ambulatory Visit
Admission: EM | Admit: 2021-11-18 | Discharge: 2021-11-18 | Disposition: A | Discharge status: Home / Self Care | Payer: Medicaid Other | Attending: Family Medicine | Admitting: Family Medicine

## 2021-11-18 ENCOUNTER — Encounter: Payer: Self-pay | Admitting: Emergency Medicine

## 2021-11-18 ENCOUNTER — Other Ambulatory Visit: Payer: Self-pay

## 2021-11-18 DIAGNOSIS — J069 Acute upper respiratory infection, unspecified: Secondary | ICD-10-CM

## 2021-11-18 DIAGNOSIS — R509 Fever, unspecified: Secondary | ICD-10-CM | POA: Diagnosis not present

## 2021-11-18 DIAGNOSIS — Z20828 Contact with and (suspected) exposure to other viral communicable diseases: Secondary | ICD-10-CM

## 2021-11-18 DIAGNOSIS — Z20822 Contact with and (suspected) exposure to covid-19: Secondary | ICD-10-CM | POA: Diagnosis not present

## 2021-11-18 MED ORDER — OSELTAMIVIR PHOSPHATE 75 MG PO CAPS: 75.0000 mg | ORAL_CAPSULE | Freq: Two times a day (BID) | ORAL | 0 refills | Status: DC

## 2021-11-18 MED ORDER — PROMETHAZINE-DM 6.25-15 MG/5ML PO SYRP: 5.0000 mL | ORAL_SOLUTION | Freq: Four times a day (QID) | ORAL | 0 refills | Status: DC | PRN

## 2021-11-18 NOTE — ED Triage Notes (Signed)
Fever, cough, body aches and diarrhea since yesterday

## 2021-11-18 NOTE — ED Provider Notes (Signed)
[facility]    CSN: [id number] Arrival date & time: 11/18/21  0902      History   Chief Complaint No chief complaint on file.   HPI Selena Barr is a 30 y.o. female.   Presenting today with 1 day history of fever, body aches, cough, congestion, diarrhea, fatigue.  Denies chest pain, shortness of breath, abdominal pain, nausea or vomiting.  So far not trying anything over-the-counter for symptoms.  States both of her children were diagnosed this week with influenza.  No known history of chronic pulmonary disease.   Past Medical History:  Diagnosis Date   Allergy    Diabetes mellitus without complication (HCC)    Hyperlipidemia 10/24/2016   Hypertension    Tobacco abuse 09/22/2016    Patient Active Problem List   Diagnosis Date Noted   Chronic hypertension 06/11/2020   Postpartum hypertension 02/23/2019   Death of infant 08/12/2018   Mixed hyperlipidemia 10/24/2016   Type 2 diabetes mellitus (HCC) 09/22/2016   Morbid obesity (HCC) 09/22/2016   Tobacco abuse 09/22/2016    Past Surgical History:  Procedure Laterality Date   WISDOM TOOTH EXTRACTION      OB History     Gravida  3   Para  3   Term  3   Preterm      AB      Living  2      SAB      IAB      Ectopic      Multiple      Live Births  3            Home Medications    Prior to Admission medications   Medication Sig Start Date End Date Taking? Authorizing Provider  oseltamivir (TAMIFLU) 75 MG capsule Take 1 capsule (75 mg total) by mouth every 12 (twelve) hours. 11/18/21  Yes Particia Nearing, PA-C  albuterol (VENTOLIN HFA) 108 (90 Base) MCG/ACT inhaler Inhale 1-2 puffs into the lungs every 6 (six) hours as needed for wheezing or shortness of breath. 09/14/21   Particia Nearing, PA-C  amLODipine (NORVASC) 10 MG tablet Take 1 tablet (10 mg total) by mouth daily. 06/11/20   Cheral Marker, CNM  benazepril (LOTENSIN) 5 MG tablet Take 1 tablet (5 mg total)  by mouth daily. 06/15/20   Cheral Marker, CNM  cetirizine (ZYRTEC) 10 MG tablet Take 1 tablet (10 mg total) by mouth daily. 05/16/20   Wurst, [place], PA-C  fluticasone (FLONASE) 50 MCG/ACT nasal spray Place 1 spray into both nostrils 2 (two) times daily. 09/14/21   Particia Nearing, PA-C  megestrol (MEGACE) 40 MG tablet TAKE 3 TABLETS BY MOUTH DAILY X 5 DAYS, TAKE 2 TABLETS DAILY X 5 DAYS, THEN TAKE 1 TABLET DAILY 07/15/21   Lazaro Arms, MD  promethazine-dextromethorphan (PROMETHAZINE-DM) 6.25-15 MG/5ML syrup Take 5 mLs by mouth 4 (four) times daily as needed for cough. 11/18/21   Particia Nearing, PA-C  hydrochlorothiazide (HYDRODIURIL) 25 MG tablet Take 1 tablet (25 mg total) by mouth daily. Patient not taking: Reported on 03/05/2019 02/06/19 05/06/20  Cheral Marker, CNM  metFORMIN (GLUCOPHAGE) 1000 MG tablet Take 1 tablet (1,000 mg total) by mouth 2 (two) times daily with a meal. 05/09/18 03/06/20  Adline Potter, NP  norethindrone (MICRONOR,CAMILA,ERRIN) 0.35 MG tablet Take 1 tablet (0.35 mg total) by mouth daily. 23-Feb-2019 03/06/20  Cheral Marker, CNM    Family History Family History  Problem Relation  Age of Onset   Diabetes Mother    Alcohol abuse Mother    COPD Mother    Drug abuse Mother    Cancer Maternal Grandmother        throat   Cancer Maternal Grandfather        throat   Diabetes Maternal Grandfather     Social History Social History   Tobacco Use   Smoking status: Every Day    Packs/day: 0.25    Years: 2.00    Pack years: 0.50    Types: Cigarettes   Smokeless tobacco: Never   Tobacco comments:    "5 per day"  Vaping Use   Vaping Use: Never used  Substance Use Topics   Alcohol use: No   Drug use: No    Comment: denies use 08/28/14     Allergies   Solu-medrol [methylprednisolone sodium succ]   Review of Systems Review of Systems Per HPI  Physical Exam Triage Vital Signs ED Triage Vitals  Enc Vitals Group     BP 11/18/21 0923 (!)  171/97     Pulse Rate 11/18/21 0923 97     Resp 11/18/21 0923 18     Temp 11/18/21 0923 98.7 F (37.1 C)     Temp Source 11/18/21 0923 Oral     SpO2 11/18/21 0923 98 %     Weight --      Height --      Head Circumference --      Peak Flow --      Pain Score 11/18/21 0925 0     Pain Loc --      Pain Edu? --      Excl. in GC? --    No data found.  Updated Vital Signs BP (!) 171/97 (BP Location: Right Arm)    Pulse 97    Temp 98.7 F (37.1 C) (Oral)    Resp 18    LMP 11/03/2021 (Approximate)    SpO2 98%   Visual Acuity Right Eye Distance:   Left Eye Distance:   Bilateral Distance:    Right Eye Near:   Left Eye Near:    Bilateral Near:     Physical Exam Vitals and nursing note reviewed.  Constitutional:      Appearance: Normal appearance.  HENT:     Head: Atraumatic.     Right Ear: Tympanic membrane and external ear normal.     Left Ear: Tympanic membrane and external ear normal.     Nose: Rhinorrhea present.     Mouth/Throat:     Mouth: Mucous membranes are moist.     Pharynx: Posterior oropharyngeal erythema present.  Eyes:     Extraocular Movements: Extraocular movements intact.     Conjunctiva/sclera: Conjunctivae normal.  Cardiovascular:     Rate and Rhythm: Normal rate and regular rhythm.     Heart sounds: Normal heart sounds.  Pulmonary:     Effort: Pulmonary effort is normal.     Breath sounds: Normal breath sounds. No wheezing.  Abdominal:     General: Bowel sounds are normal. There is no distension.     Palpations: Abdomen is soft.     Tenderness: There is no abdominal tenderness. There is no guarding.  Musculoskeletal:        General: Normal range of motion.     Cervical back: Normal range of motion and neck supple.  Skin:    General: Skin is warm and dry.  Neurological:     Mental  Status: She is alert and oriented to person, place, and time.  Psychiatric:        Mood and Affect: Mood normal.        Thought Content: Thought content normal.      UC Treatments / Results  Labs (all labs ordered are listed, but only abnormal results are displayed) Labs Reviewed  COVID-19, FLU A+B NAA    EKG   Radiology No results found.  Procedures Procedures (including critical care time)  Medications Ordered in UC Medications - No data to display  Initial Impression / Assessment and Plan / UC Course  I have reviewed the triage vital signs and the nursing notes.  Pertinent labs & imaging results that were available during my care of the patient were reviewed by me and considered in my medical decision making (see chart for details).     Hypertensive in triage, otherwise vital signs benign and reassuring.  Given her home exposure to influenza and symptoms, suspect influenza.  COVID and flu testing pending, will proactively treat with Tamiflu while awaiting test results.  Discussed supportive over-the-counter medications and home care.  Return for acutely worsening symptoms.  Work note given.  Final Clinical Impressions(s) / UC Diagnoses   Final diagnoses:  Exposure to COVID-19 virus  Exposure to influenza  Viral URI with cough  Fever, unspecified   Discharge Instructions   None    ED Prescriptions     Medication Sig Dispense Auth. Provider   promethazine-dextromethorphan (PROMETHAZINE-DM) 6.25-15 MG/5ML syrup Take 5 mLs by mouth 4 (four) times daily as needed for cough. 100 mL Particia Nearing, [place]   oseltamivir (TAMIFLU) 75 MG capsule Take 1 capsule (75 mg total) by mouth every 12 (twelve) hours. 10 capsule Particia Nearing, [place]      PDMP not reviewed this encounter.   Particia Nearing, [place] 11/18/21 1041

## 2021-11-19 LAB — COVID-19, FLU A+B NAA
Influenza A, NAA: DETECTED — AB
Influenza B, NAA: NOT DETECTED
SARS-CoV-2, NAA: NOT DETECTED

## 2022-01-30 ENCOUNTER — Encounter: Payer: Self-pay | Admitting: Adult Health

## 2022-01-30 ENCOUNTER — Other Ambulatory Visit: Payer: Self-pay

## 2022-01-30 ENCOUNTER — Other Ambulatory Visit (HOSPITAL_COMMUNITY)
Admission: RE | Admit: 2022-01-30 | Discharge: 2022-01-30 | Disposition: A | Discharge status: Home / Self Care | Payer: Medicaid Other | Source: Ambulatory Visit | Attending: Adult Health | Admitting: Adult Health

## 2022-01-30 ENCOUNTER — Ambulatory Visit: Payer: Medicaid Other | Admitting: Adult Health

## 2022-01-30 VITALS — BP 153/90 | HR 86 | Ht 63.0 in | Wt 339.0 lb

## 2022-01-30 DIAGNOSIS — I10 Essential (primary) hypertension: Secondary | ICD-10-CM

## 2022-01-30 DIAGNOSIS — Z3202 Encounter for pregnancy test, result negative: Secondary | ICD-10-CM | POA: Diagnosis not present

## 2022-01-30 DIAGNOSIS — Z3009 Encounter for other general counseling and advice on contraception: Secondary | ICD-10-CM | POA: Diagnosis not present

## 2022-01-30 DIAGNOSIS — N921 Excessive and frequent menstruation with irregular cycle: Secondary | ICD-10-CM | POA: Insufficient documentation

## 2022-01-30 DIAGNOSIS — Z8632 Personal history of gestational diabetes: Secondary | ICD-10-CM | POA: Insufficient documentation

## 2022-01-30 DIAGNOSIS — Z113 Encounter for screening for infections with a predominantly sexual mode of transmission: Secondary | ICD-10-CM | POA: Insufficient documentation

## 2022-01-30 DIAGNOSIS — Z124 Encounter for screening for malignant neoplasm of cervix: Secondary | ICD-10-CM | POA: Insufficient documentation

## 2022-01-30 LAB — POCT URINE PREGNANCY: Preg Test, Ur: NEGATIVE

## 2022-01-30 MED ORDER — AMLODIPINE BESYLATE 10 MG PO TABS: 10.0000 mg | ORAL_TABLET | Freq: Every day | ORAL | 6 refills | Status: DC

## 2022-01-30 NOTE — Progress Notes (Signed)
Subjective:     Patient ID: Selena Barr, female   DOB: July 20, 1991, 31 y.o.   MRN: [id number]  HPI [name] is a 31 year old black female,single, G3P3002 in to discuss getting on birth control she thinks she wants nexplanon. She needs pap too. She is working at [employer]. No current PCP  Review of Systems Periods heavy at times and not regular, and may have clots Patient denies any headaches, hearing loss, fatigue, blurred vision, shortness of breath, chest pain, abdominal pain, problems with bowel movements, urination, or intercourse. No joint pain or mood swings.  She was on BP meds when pregnant, and had diabetes, when pregnant she says.    Reviewed past medical,surgical, social and family history. Reviewed medications and allergies.  Objective:   Physical Exam BP (!) 153/90 (BP Location: Right Arm, Patient Position: Sitting, Cuff Size: Large)    Pulse 86    Ht 5\' 3"  (1.6 m)    Wt (!) 339 lb (153.8 kg)    LMP 01/23/2022    BMI 60.05 kg/m  UPT is negative. She is not on any BP meds.  Skin warm and dry.Lungs: clear to ausculation bilaterally. Cardiovascular: regular rate and rhythm.    Pelvic: external genitalia is normal in appearance no lesions, vagina: pink and moist,urethra has no lesions or masses noted, cervix:smooth and bulbous,Pap with GC/CHL and HR HPV genotyping performed, uterus: normal size, shape and contour, non tender, no masses felt, adnexa: no masses or tenderness noted. Bladder is non tender and no masses felt.   Upstream - 01/30/22 1358       Pregnancy Intention Screening   Does the patient want to become pregnant in the next year? No    Does the patient's partner want to become pregnant in the next year? No    Would the patient like to discuss contraceptive options today? Yes      Contraception Wrap Up   Current Method Female Condom    End Method Female Condom;Hormonal Implant    Contraception Counseling Provided Yes             Upstream -  01/30/22 1358       Pregnancy Intention Screening   Does the patient want to become pregnant in the next year? No    Does the patient's partner want to become pregnant in the next year? No    Would the patient like to discuss contraceptive options today? Yes      Contraception Wrap Up   Current Method Female Condom    End Method Female Condom;Hormonal Implant    Contraception Counseling Provided Yes             Examination chaperoned by 02/01/22 LPN  Assessment:     1. Routine cervical smear Pap sent for GC/CHL and HR HPV genotyping  - Cytology - PAP( [place])  2. Screen for STD (sexually transmitted disease) GC/CHL on pap - Cytology - PAP( [place])  3. Pregnancy test negative - POCT urine pregnancy  4. Chronic hypertension Will start back on Norvasc 10 mg 1 daily Review DASH diet Meds ordered this encounter  Medications   amLODipine (NORVASC) 10 MG tablet    Sig: Take 1 tablet (10 mg total) by mouth daily.    Dispense:  30 tablet    Refill:  6    Order Specific Question:   Supervising Provider    Answer:   Faith Rogue [[id number]]   Will check CMP  today  - Comprehensive metabolic panel  5. General counseling and advice for contraceptive management Discussed not using birth control with estrogen due to smoking and BP, and she wants nexplanon.   No sex, return in 3 days for nexplanon insertion  6. Menorrhagia with irregular cycle Will check labs to rule out anemia and thyroid issue - CBC - TSH  7. History of gestational diabetes Will check A1c today  - Hemoglobin A1c  8. Morbid obesity (HCC) Encouraged to lose weight    Plan:    No sex  Return 3/2/at 3:50 pm for nexplanon insertion

## 2022-01-31 LAB — CBC
Hematocrit: 44.8 % (ref 34.0–46.6)
Hemoglobin: 14.8 g/dL (ref 11.1–15.9)
MCH: 28.4 pg (ref 26.6–33.0)
MCHC: 33 g/dL (ref 31.5–35.7)
MCV: 86 fL (ref 79–97)
Platelets: 460 10*3/uL — ABNORMAL HIGH (ref 150–450)
RBC: 5.21 x10E6/uL (ref 3.77–5.28)
RDW: 13.4 % (ref 11.7–15.4)
WBC: 12.1 10*3/uL — ABNORMAL HIGH (ref 3.4–10.8)

## 2022-01-31 LAB — COMPREHENSIVE METABOLIC PANEL
ALT: 20 IU/L (ref 0–32)
AST: 14 IU/L (ref 0–40)
Albumin/Globulin Ratio: 1.2 (ref 1.2–2.2)
Albumin: 4.1 g/dL (ref 3.9–5.0)
Alkaline Phosphatase: 110 IU/L (ref 44–121)
BUN/Creatinine Ratio: 11 (ref 9–23)
BUN: 10 mg/dL (ref 6–20)
Bilirubin Total: 0.2 mg/dL (ref 0.0–1.2)
CO2: 25 mmol/L (ref 20–29)
Calcium: 9.3 mg/dL (ref 8.7–10.2)
Chloride: 100 mmol/L (ref 96–106)
Creatinine, Ser: 0.88 mg/dL (ref 0.57–1.00)
Globulin, Total: 3.5 g/dL (ref 1.5–4.5)
Glucose: 143 mg/dL — ABNORMAL HIGH (ref 70–99)
Potassium: 4.8 mmol/L (ref 3.5–5.2)
Sodium: 137 mmol/L (ref 134–144)
Total Protein: 7.6 g/dL (ref 6.0–8.5)
eGFR: 91 mL/min/{1.73_m2} (ref >59)

## 2022-01-31 LAB — TSH: TSH: 0.566 u[IU]/mL (ref 0.450–4.500)

## 2022-01-31 LAB — HEMOGLOBIN A1C
Est. average glucose Bld gHb Est-mCnc: 235 mg/dL
Hgb A1c MFr Bld: 9.8 % — ABNORMAL HIGH (ref 4.8–5.6)

## 2022-02-02 ENCOUNTER — Encounter: Payer: Self-pay | Admitting: Adult Health

## 2022-02-02 ENCOUNTER — Other Ambulatory Visit: Payer: Self-pay

## 2022-02-02 ENCOUNTER — Ambulatory Visit: Payer: Medicaid Other | Admitting: Adult Health

## 2022-02-02 VITALS — BP 145/91 | HR 104 | Ht 62.0 in | Wt 334.0 lb

## 2022-02-02 DIAGNOSIS — Z3202 Encounter for pregnancy test, result negative: Secondary | ICD-10-CM

## 2022-02-02 DIAGNOSIS — E119 Type 2 diabetes mellitus without complications: Secondary | ICD-10-CM | POA: Diagnosis not present

## 2022-02-02 DIAGNOSIS — I10 Essential (primary) hypertension: Secondary | ICD-10-CM | POA: Diagnosis not present

## 2022-02-02 DIAGNOSIS — Z30017 Encounter for initial prescription of implantable subdermal contraceptive: Secondary | ICD-10-CM

## 2022-02-02 LAB — POCT URINE PREGNANCY: Preg Test, Ur: NEGATIVE

## 2022-02-02 LAB — CYTOLOGY - PAP
Chlamydia: NEGATIVE
Comment: NEGATIVE
Comment: NEGATIVE
Comment: NORMAL
Diagnosis: NEGATIVE
High risk HPV: NEGATIVE
Neisseria Gonorrhea: NEGATIVE

## 2022-02-02 MED ORDER — METFORMIN HCL 500 MG PO TABS: 500.0000 mg | ORAL_TABLET | Freq: Two times a day (BID) | ORAL | 3 refills | Status: DC

## 2022-02-02 MED ORDER — ETONOGESTREL 68 MG ~~LOC~~ IMPL
68.0000 mg | DRUG_IMPLANT | Freq: Once | SUBCUTANEOUS | Status: AC
  Administered 2022-02-02: 68 mg via SUBCUTANEOUS

## 2022-02-02 NOTE — Progress Notes (Signed)
?  Subjective:  ?  ? Patient ID: Selena Barr, female   DOB: [birth year], 31 y.o.   MRN: [id number] ? ?HPI ?Selena Barr is a 31 year old black female,single, G3P3002 in for nexplanon insertion. She had pap and physical on 01/30/22 and pap was normal, A1c is 9.8. ? ?Lab Results  ?Component Value Date  ? DIAGPAP  01/30/2022  ?  - Negative for intraepithelial lesion or malignancy (NILM)  ? HPVHIGH Negative 01/30/2022  ? No current PCP ? ?Review of Systems ?For nexplanon insertion ?Reviewed past medical,surgical, social and family history. Reviewed medications and allergies.  ?   ?Objective:  ? Physical Exam ?BP (!) 145/91 (BP Location: Right Arm, Patient Position: Sitting, Cuff Size: Normal)   Pulse (!) 104   Ht 5\' 2"  (1.575 m)   Wt (!) 334 lb (151.5 kg)   LMP 01/23/2022   BMI 61.09 kg/m?  UPT is negative. Last sex was august  ?Consent signed, time out called. Left arm cleansed with betadine, and injected with 1.5 cc 2% lidocaine and waited til numb. Nexplanon easily inserted and steri strips applied.Rod easily palpated by provider, pt.declines to feel. Pressure dressing applied.  ?   ? Upstream - 02/02/22 1638   ? ?  ? Pregnancy Intention Screening  ? Does the patient want to become pregnant in the next year? No   ? Does the patient's partner want to become pregnant in the next year? No   ? Would the patient like to discuss contraceptive options today? No   ?  ? Contraception Wrap Up  ? Current Method Abstinence   ? End Method Hormonal Implant   ? Contraception Counseling Provided No   ? ?  ?  ? ?  ? Reviewed labs with her.  ? ?Assessment:  ?   ?1. Pregnancy test negative ? ?2. Type 2 diabetes mellitus without complication, without long-term current use of insulin (HCC) ?Will start metformin 500 mg 1 bid ?Meds ordered this encounter  ?Medications  ? etonogestrel (NEXPLANON) implant 68 mg  ? metFORMIN (GLUCOPHAGE) 500 MG tablet  ?  Sig: Take 1 tablet (500 mg total) by mouth 2 (two) times daily with a meal.  ?   Dispense:  60 tablet  ?  Refill:  3  ?  Order Specific Question:   Supervising Provider  ?  Answer:   04/04/22 H [[id number]]  ?  ?Will refer to diabetic and nutrition services ?Will refer to Dr Duane Lope  ? ?3. Nexplanon insertion ?[name] ?Exp [id number] ?Use condoms x 2 weeks, keep clean and dry x 24 hours, no heavy lifting, keep steri strips on x 72 hours, Keep pressure dressing on x 24 hours. Follow up prn problems.  ? ?4. Chronic hypertension ?On norvasc 10 mg 1 daily has refills ?   ?Plan:  ?   ?Follow up in 3 months  ?   ?

## 2022-02-02 NOTE — Patient Instructions (Signed)
Use condoms x 2 weeks, keep clean and dry x 24 hours, no heavy lifting, keep steri strips on x 72 hours, Keep pressure dressing on x 24 hours. Follow up prn problems.  

## 2022-02-20 ENCOUNTER — Ambulatory Visit (INDEPENDENT_AMBULATORY_CARE_PROVIDER_SITE_OTHER): Payer: Medicaid Other | Admitting: "Endocrinology

## 2022-02-20 ENCOUNTER — Encounter: Payer: Self-pay | Admitting: "Endocrinology

## 2022-02-20 ENCOUNTER — Other Ambulatory Visit: Payer: Self-pay

## 2022-02-20 VITALS — BP 124/82 | HR 92 | Ht 63.0 in | Wt 342.2 lb

## 2022-02-20 DIAGNOSIS — E119 Type 2 diabetes mellitus without complications: Secondary | ICD-10-CM | POA: Diagnosis not present

## 2022-02-20 DIAGNOSIS — E782 Mixed hyperlipidemia: Secondary | ICD-10-CM | POA: Diagnosis not present

## 2022-02-20 DIAGNOSIS — I10 Essential (primary) hypertension: Secondary | ICD-10-CM | POA: Diagnosis not present

## 2022-02-20 MED ORDER — ACCU-CHEK GUIDE ME W/DEVICE KIT: 1.0000 | PACK | 0 refills | Status: DC

## 2022-02-20 MED ORDER — ACCU-CHEK GUIDE VI STRP: ORAL_STRIP | 2 refills | Status: DC

## 2022-02-20 MED ORDER — GLIPIZIDE ER 5 MG PO TB24: 5.0000 mg | ORAL_TABLET | Freq: Every day | ORAL | 3 refills | Status: DC

## 2022-02-20 NOTE — Progress Notes (Signed)
Endocrinology Consult Note       02/20/2022, 6:59 PM   Subjective:    Patient ID: Selena Barr, female    DOB: 1991-05-28.  Shiza C Leiber is being seen in consultation for management of currently uncontrolled symptomatic diabetes requested by  Pcp, No.   Past Medical History:  Diagnosis Date   Allergy    Diabetes mellitus without complication (HCC)    Hyperlipidemia 10/24/2016   Hypertension    Tobacco abuse 09/22/2016    Past Surgical History:  Procedure Laterality Date   WISDOM TOOTH EXTRACTION      Social History   Socioeconomic History   Marital status: Single    Spouse name: Not on file   Number of children: 1   Years of education: 12   Highest education level: Not on file  Occupational History    Comment: unemployed  Tobacco Use   Smoking status: Every Day    Packs/day: 0.25    Years: 2.00    Pack years: 0.50    Types: Cigarettes   Smokeless tobacco: Never   Tobacco comments:    "5 per day"  Vaping Use   Vaping Use: Never used  Substance and Sexual Activity   Alcohol use: No   Drug use: No    Comment: denies use 08/28/14   Sexual activity: Yes    Birth control/protection: Condom, Implant  Other Topics Concern   Not on file  Social History Narrative   Lives with mother and brother, sister, daughter   Social Determinants of Health   Financial Resource Strain: Not on file  Food Insecurity: Not on file  Transportation Needs: Not on file  Physical Activity: Not on file  Stress: Not on file  Social Connections: Not on file    Family History  Problem Relation Age of Onset   Hypertension Mother    Diabetes Mother    Alcohol abuse Mother    COPD Mother    Drug abuse Mother    Cancer Maternal Grandmother        throat   Cancer Maternal Grandfather        throat   Diabetes Maternal Grandfather     Outpatient Encounter Medications as of 02/20/2022   Medication Sig   Blood Glucose Monitoring Suppl (ACCU-CHEK GUIDE ME) w/Device KIT 1 Piece by Does not apply route as directed.   glipiZIDE (GLUCOTROL XL) 5 MG 24 hr tablet Take 1 tablet (5 mg total) by mouth daily with breakfast.   glucose blood (ACCU-CHEK GUIDE) test strip Use to monitor glucose 2 times a day   megestrol (MEGACE) 40 MG tablet TAKE 3 TABLETS BY MOUTH DAILY X 5 DAYS, TAKE 2 TABLETS DAILY X 5 DAYS, THEN TAKE 1 TABLET DAILY   amLODipine (NORVASC) 10 MG tablet Take 1 tablet (10 mg total) by mouth daily.   metFORMIN (GLUCOPHAGE) 500 MG tablet Take 1 tablet (500 mg total) by mouth 2 (two) times daily with a meal.   [DISCONTINUED] cetirizine (ZYRTEC) 10 MG tablet Take 1 tablet (10 mg total) by mouth daily. (Patient not taking: Reported on 01/30/2022)   [DISCONTINUED] hydrochlorothiazide (HYDRODIURIL) 25  MG tablet Take 1 tablet (25 mg total) by mouth daily. (Patient not taking: Reported on 03/05/2019)   [DISCONTINUED] norethindrone (MICRONOR,CAMILA,ERRIN) 0.35 MG tablet Take 1 tablet (0.35 mg total) by mouth daily.   No facility-administered encounter medications on file as of 02/20/2022.    ALLERGIES: Allergies  Allergen Reactions   Solu-Medrol [Methylprednisolone Sodium Succ]     Rash, lip swelling     VACCINATION STATUS: Immunization History  Administered Date(s) Administered   HPV Quadrivalent 08/18/2008   Hepatitis A 11/17/2008   Hepatitis B 12/14/1997, 08/29/2004, 02/27/2005   Influenza Whole 09/22/2016   Influenza,inj,Quad PF,6+ Mos 11/08/2017, 08/19/2018   Influenza-Unspecified 12/06/2012   MMR 01/07/2019   Meningococcal Conjugate 08/18/2008   Pneumococcal Conjugate-13 11/08/2017   Pneumococcal Polysaccharide-23 01/07/2019   Tdap 09/16/2015, 11/25/2018    Diabetes She presents for her initial diabetic visit. She has type 2 diabetes mellitus. Onset time: Patient was diagnosed with type 2 diabetes at approximate age of 75.  She was seen prior to 2019 for the same  diagnosis, however did not return for follow-up appointment. There are no hypoglycemic associated symptoms. Pertinent negatives for hypoglycemia include no confusion, headaches, pallor or seizures. Associated symptoms include fatigue, polydipsia and polyuria. Pertinent negatives for diabetes include no chest pain and no polyphagia. There are no hypoglycemic complications. (Morbid obesity, chronic heavy smoking. ) Risk factors for coronary artery disease include dyslipidemia, diabetes mellitus, obesity, tobacco exposure, sedentary lifestyle, hypertension and family history. Current diabetic treatments: She is on metformin 500 mg p.o. twice daily. Her weight is increasing steadily. She is following a generally unhealthy diet. When asked about meal planning, she reported none. (She does not monitor blood glucose.  She did not bring any meter nor logs.  Her recent A1c of 9.8%.) An ACE inhibitor/angiotensin II receptor blocker is not being taken.  Hypertension This is a chronic problem. The problem is controlled. Pertinent negatives include no chest pain, headaches, palpitations or shortness of breath. Risk factors for coronary artery disease include dyslipidemia, diabetes mellitus, obesity, family history, smoking/tobacco exposure and sedentary lifestyle. Past treatments include calcium channel blockers.  Hyperlipidemia This is a chronic problem. The problem is uncontrolled. Exacerbating diseases include diabetes and obesity. Pertinent negatives include no chest pain, myalgias or shortness of breath. Risk factors for coronary artery disease include dyslipidemia, diabetes mellitus, hypertension, family history, obesity and a sedentary lifestyle.    Review of Systems  Constitutional:  Positive for fatigue. Negative for chills, fever and unexpected weight change.  HENT:  Negative for trouble swallowing and voice change.   Eyes:  Negative for visual disturbance.  Respiratory:  Negative for cough, shortness of  breath and wheezing.   Cardiovascular:  Negative for chest pain, palpitations and leg swelling.  Gastrointestinal:  Negative for diarrhea, nausea and vomiting.  Endocrine: Positive for polydipsia and polyuria. Negative for cold intolerance, heat intolerance and polyphagia.  Musculoskeletal:  Negative for arthralgias and myalgias.  Skin:  Negative for color change, pallor, rash and wound.  Neurological:  Negative for seizures and headaches.  Psychiatric/Behavioral:  Negative for confusion and suicidal ideas.    Objective:    Vitals with BMI 02/20/2022 02/02/2022 01/30/2022  Height 5\' 3"  5\' 2"  5\' 3"   Weight 342 lbs 3 oz 334 lbs 339 lbs  BMI 60.63 61.07 60.07  Systolic 124 145 409  Diastolic 82 91 90  Pulse 92 104 86    BP 124/82   Pulse 92   Ht 5\' 3"  (1.6 m)   Wt (!) 342  lb 3.2 oz (155.2 kg)   LMP 01/23/2022   BMI 60.62 kg/m   Wt Readings from Last 3 Encounters:  02/20/22 (!) 342 lb 3.2 oz (155.2 kg)  02/02/22 (!) 334 lb (151.5 kg)  01/30/22 (!) 339 lb (153.8 kg)     Physical Exam Constitutional:      Appearance: She is well-developed.  HENT:     Head: Normocephalic and atraumatic.  Neck:     Thyroid: No thyromegaly.     Trachea: No tracheal deviation.  Cardiovascular:     Rate and Rhythm: Normal rate and regular rhythm.  Pulmonary:     Effort: Pulmonary effort is normal.  Abdominal:     Tenderness: There is no abdominal tenderness. There is no guarding.  Musculoskeletal:        General: Normal range of motion.     Cervical back: Normal range of motion and neck supple.  Skin:    General: Skin is warm and dry.     Coloration: Skin is not pale.     Findings: No erythema or rash.  Neurological:     Mental Status: She is alert and oriented to person, place, and time.     Cranial Nerves: No cranial nerve deficit.     Coordination: Coordination normal.     Deep Tendon Reflexes: Reflexes are normal and symmetric.  Psychiatric:        Judgment: Judgment normal.      Comments: Patient has reluctant affect, unconcerned attitude      CMP ( most recent) CMP     Component Value Date/Time   NA 137 01/30/2022 1444   K 4.8 01/30/2022 1444   CL 100 01/30/2022 1444   CO2 25 01/30/2022 1444   GLUCOSE 143 (H) 01/30/2022 1444   GLUCOSE 92 10/18/2018 0825   BUN 10 01/30/2022 1444   CREATININE 0.88 01/30/2022 1444   CREATININE 0.70 12/19/2017 0900   CALCIUM 9.3 01/30/2022 1444   PROT 7.6 01/30/2022 1444   ALBUMIN 4.1 01/30/2022 1444   AST 14 01/30/2022 1444   ALT 20 01/30/2022 1444   ALKPHOS 110 01/30/2022 1444   BILITOT 0.2 01/30/2022 1444   GFRNONAA >60 10/18/2018 0825   GFRNONAA 124 11/08/2017 1129   GFRAA >60 10/18/2018 0825   GFRAA 144 11/08/2017 1129     Diabetic Labs (most recent): Lab Results  Component Value Date   HGBA1C 9.8 (H) 01/30/2022   HGBA1C 5.9 (H) 07/23/2018   HGBA1C 7.3 (H) 11/08/2017     Lipid Panel ( most recent) Lipid Panel     Component Value Date/Time   CHOL 181 11/08/2017 1129   TRIG 112 11/08/2017 1129   HDL 28 (L) 11/08/2017 1129   CHOLHDL 6.5 (H) 11/08/2017 1129   VLDL 21 01/24/2017 1055   LDLCALC 131 (H) 11/08/2017 1129      Lab Results  Component Value Date   TSH 0.566 01/30/2022   TSH 1.17 12/19/2017   TSH 0.63 10/23/2016   FREET4 1.7 12/19/2017       Assessment & Plan:   1. Type 2 diabetes mellitus without complication, without long-term current use of insulin (HCC)  - Lanie C Mckown has currently uncontrolled symptomatic type 2 DM since  31 years of age,  with most recent A1c of 9.8 %. Recent labs reviewed. - I had a long discussion with her about the progressive nature of diabetes and the pathology behind its complications. -her diabetes is complicated by morbid obesity, smoking, noncompliance and she remains  at a high risk for more acute and chronic complications which include CAD, CVA, CKD, retinopathy, and neuropathy. These are all discussed in detail with her.  - I discussed all  available options of managing her diabetes including de-escalation of medications. I have counseled her on diet  and weight management  by adopting a Whole Food , Plant Predominant  ( WFPP) nutrition as recommended by [employer] of Lifestyle Medicine. Patient is encouraged to switch to  unprocessed or minimally processed  complex starch, adequate protein intake (mainly plant source), minimal liquid fat ( mainly vegetable oils), plenty of fruits, and vegetables. -  she is advised to stick to a routine mealtimes to eat 3 complete meals a day and snack only when necessary ( to snack only to correct hypoglycemia BG <70 day time or <100 at night).   - she acknowledges that there is a room for improvement in her food and drink choices. - Further Specific Suggestion is made for her to avoid simple carbohydrates  from her diet including Cakes, Sweet Desserts, Ice Cream, Soda (diet and regular), Sweet Tea, Candies, Chips, Cookies, Store Bought Juices, Alcohol ,  Artificial Sweeteners,  Coffee Creamer, and "Sugar-free" Products. This will help patient to have more stable blood glucose profile and potentially avoid unintended weight gain.  The following Lifestyle Medicine recommendations according to American College of Lifestyle Medicine [facility]) were discussed and offered to patient and she agrees to start the journey:  A. Whole Foods, Plant-based plate comprising of fruits and vegetables, plant-based proteins, whole-grain carbohydrates was discussed in detail with the patient.   A list for source of those nutrients were also provided to the patient.  Patient will use only water or unsweetened tea for hydration. B.  The need to stay away from risky substances including alcohol, smoking; obtaining 7 to 9 hours of restorative sleep, at least 150 minutes of moderate intensity exercise weekly, the importance of healthy social connections,  and stress reduction techniques were discussed. C.  A full color page of   Calorie density of various food groups per pound showing examples of each food groups was provided to the patient.  - she will be scheduled with Norm Salt, RDN, CDE for individualized diabetes education.  - I have approached her with the following plan to manage  her diabetes and patient agrees:  -In light of her presentation with uncontrolled diabetes, she may need multimodality treatment in order for her to control diabetes with target. -She does not show display appropriate commitment for monitoring blood glucose.   In the meantime, she is advised to monitor blood glucose at least twice a day-before breakfast and at bedtime and return in 4 weeks with her meter and logs.    -In preparation, she is advised to continue metformin 1000 mg p.o. twice daily, discussed and added glipizide 5 mg XL p.o. at breakfast. - she is encouraged to call clinic for blood glucose levels less than 70 or above 300 mg /dl.  - she will be considered for incretin therapy as appropriate next visit.  - Specific targets for  A1c;  LDL, HDL,  and Triglycerides were discussed with the patient.  2) Blood Pressure /Hypertension:  her blood pressure is  controlled to target.   she is advised to continue her current medications including amlodipine 10 mg p.o. daily with breakfast .  Will be considered for ACE inhibitor or ARB on subsequent visits. 3) Lipids/Hyperlipidemia:   Review of her recent lipid panel showed uncontrolled  LDL at 131 .  she  is not on statins.  She will be considered for statin intervention on her next visit.  This is due to the fact that she is hesitant to escalate medications at the moment.   4)  Weight/Diet:  Body mass index is 60.62 kg/m.  -   clearly complicating her diabetes care.   she is  a candidate for weight loss. I discussed with her the fact that loss of 5 - 10% of her  current body weight will have the most impact on her diabetes management.  The above detailed  ACLM recommendations for  nutrition, exercise, sleep, social life, avoidance of risky substances, the need for restorative sleep   information will also detailed on discharge instructions.  5) Chronic Care/Health Maintenance:  -she  is not  on ACEI/ARB and Statin medications and  is encouraged to initiate and continue to follow up with Ophthalmology, Dentist,  Podiatrist at least yearly or according to recommendations, and advised to  quit smoking. I have recommended yearly flu vaccine and pneumonia vaccine at least every 5 years; moderate intensity exercise for up to 150 minutes weekly; and  sleep for 7- 9 hours a day.  - she is  advised to maintain close follow up with Pcp, No for primary care needs, as well as her other providers for optimal and coordinated care.   I spent 62 minutes in the care of the patient today including review of labs from CMP, Lipids, Thyroid Function, Hematology (current and previous including abstractions from other facilities); face-to-face time discussing  her blood glucose readings/logs, discussing hypoglycemia and hyperglycemia episodes and symptoms, medications doses, her options of short and long term treatment based on the latest standards of care / guidelines;  discussion about incorporating lifestyle medicine;  and documenting the encounter.     Please refer to Patient Instructions for Blood Glucose Monitoring and Insulin/Medications Dosing Guide"  in media tab for additional information. Please  also refer to " Patient Self Inventory" in the Media  tab for reviewed elements of pertinent patient history.  Venessa C Sabet participated in the discussions, expressed understanding, and voiced agreement with the above plans.  All questions were answered to her satisfaction. she is encouraged to contact clinic should she have any questions or concerns prior to her return visit.   Follow up plan: - Return in about 4 weeks (around 03/20/2022) for F/U with Meter and Logs Only - no Labs.  Marquis Lunch, MD [facility] [address] [place], [place] [address] Phone: [phone]  Fax: [phone]    02/20/2022, 6:59 PM  This note was partially dictated with voice recognition software. Similar sounding words can be transcribed inadequately or may not  be corrected upon review.

## 2022-02-20 NOTE — Patient Instructions (Signed)

## 2022-02-21 ENCOUNTER — Other Ambulatory Visit: Payer: Self-pay

## 2022-02-21 DIAGNOSIS — E119 Type 2 diabetes mellitus without complications: Secondary | ICD-10-CM

## 2022-02-21 MED ORDER — LANCETS MISC: 1 refills | Status: DC

## 2022-02-21 MED ORDER — ACCU-CHEK GUIDE VI STRP: ORAL_STRIP | 2 refills | Status: DC

## 2022-03-20 ENCOUNTER — Ambulatory Visit: Payer: Medicaid Other | Admitting: Obstetrics & Gynecology

## 2022-03-20 ENCOUNTER — Encounter: Payer: Self-pay | Admitting: Obstetrics & Gynecology

## 2022-03-20 VITALS — BP 138/92 | HR 104 | Ht 62.0 in | Wt 338.0 lb

## 2022-03-20 DIAGNOSIS — I10 Essential (primary) hypertension: Secondary | ICD-10-CM

## 2022-03-20 DIAGNOSIS — Z975 Presence of (intrauterine) contraceptive device: Secondary | ICD-10-CM | POA: Diagnosis not present

## 2022-03-20 NOTE — Progress Notes (Signed)
? ?  GYN VISIT ?Patient name: Selena Barr MRN [id number]  Date of birth: 1990-12-22 ?Chief Complaint:   ?Contraception (Nexplanon) ? ?History of Present Illness:   ?Selena Barr is a 31 y.o. [phone]  female being seen today for nexplanon concerns. ? ?She feels like when she moves her arm in certain positions it pinches. She has tried Tylenol with minimal improvement.  She wishes to review her options.    ? ?Patient's last menstrual period was 03/14/2022 (exact date). ? ? ?  07/21/2018  ?  9:34 AM 03/15/2018  ?  1:16 PM 11/08/2017  ? 10:53 AM 07/12/2017  ?  3:24 PM 02/28/2017  ?  8:51 AM  ?Depression screen PHQ 2/9  ?Decreased Interest 0 0 0 0 0  ?Down, Depressed, Hopeless 0 0 0 0 0  ?PHQ - 2 Score 0 0 0 0 0  ?Altered sleeping 3 3     ?Tired, decreased energy 3 3     ?Change in appetite 3 3     ?Feeling bad or failure about yourself  0 0     ?Trouble concentrating 0 0     ?Moving slowly or fidgety/restless 0 0     ?Suicidal thoughts 0 0     ?PHQ-9 Score 9 9     ?Difficult doing work/chores  Somewhat difficult     ? ? ? ?Review of Systems:   ?Pertinent items are noted in HPI ?Denies fever/chills, dizziness, headaches, visual disturbances, fatigue, shortness of breath, chest pain, abdominal pain, vomiting, no problems with periods, bowel movements, urination, or intercourse unless otherwise stated above.  ?Pertinent History Reviewed:  ?Reviewed past medical,surgical, social, obstetrical and family history.  ?Reviewed problem list, medications and allergies. ?Physical Assessment:  ? ?Vitals:  ? 03/20/22 1603  ?BP: (!) 138/92  ?Pulse: (!) 104  ?Weight: (!) 338 lb (153.3 kg)  ?Height: 5\' 2"  (1.575 m)  ?Body mass index is 61.82 kg/m?. ? ?     Physical Examination:  ? General appearance: alert, well appearing, and in no distress ? Psych: mood appropriate, normal affect ? Skin: warm & dry  ? Cardiovascular: normal heart rate noted ? Respiratory: normal respiratory effort, no distress ? Abdomen: soft, non-tender   ? Extremities: left arm with palpable device- no evidence of migration or infection ? ? ?Assessment & Plan:  ?1) Nexplanon in place ?-no abnormalities noted with device ?-discussed removal and transition to alternative options ? ?2) Contraceptive management ?-due to cHTN, not a candidate for combined OCPs ?-reviewed all options from POPs to LARCs ?-after review she has elected to continue with her Nexplanon ? ?Return for as scheduled in July. ? ? ?August, DO ?Attending Obstetrician & Gynecologist, Faculty Practice ?[facility] ? ? ? ?

## 2022-03-23 ENCOUNTER — Encounter: Payer: Self-pay | Admitting: "Endocrinology

## 2022-03-23 ENCOUNTER — Ambulatory Visit (INDEPENDENT_AMBULATORY_CARE_PROVIDER_SITE_OTHER): Payer: Medicaid Other | Admitting: "Endocrinology

## 2022-03-23 ENCOUNTER — Ambulatory Visit: Payer: Medicaid Other | Admitting: "Endocrinology

## 2022-03-23 VITALS — BP 120/82 | HR 108 | Ht 62.0 in | Wt 340.6 lb

## 2022-03-23 DIAGNOSIS — I10 Essential (primary) hypertension: Secondary | ICD-10-CM | POA: Diagnosis not present

## 2022-03-23 DIAGNOSIS — E782 Mixed hyperlipidemia: Secondary | ICD-10-CM | POA: Diagnosis not present

## 2022-03-23 DIAGNOSIS — E119 Type 2 diabetes mellitus without complications: Secondary | ICD-10-CM | POA: Diagnosis not present

## 2022-03-23 NOTE — Progress Notes (Signed)
? ?                                                          ?     03/23/2022, 4:45 PM ? ? ?Endocrinology follow-up note ? ?Subjective:  ? ? Patient ID: Selena Barr, female    DOB: February 25, 1991.  ?Selena Barr is being seen in follow-up after she was seen in consultation  for management of currently uncontrolled symptomatic diabetes . ? ? ?Past Medical History:  ?Diagnosis Date  ? Allergy   ? Diabetes mellitus without complication ([place])   ? Hyperlipidemia 10/24/2016  ? Hypertension   ? Tobacco abuse 09/22/2016  ? ? ?Past Surgical History:  ?Procedure Laterality Date  ? WISDOM TOOTH EXTRACTION    ? ? ?Social History  ? ?Socioeconomic History  ? Marital status: Single  ?  Spouse name: Not on file  ? Number of children: 1  ? Years of education: 73  ? Highest education level: Not on file  ?Occupational History  ?  Comment: unemployed  ?Tobacco Use  ? Smoking status: Every Day  ?  Packs/day: 0.25  ?  Years: 2.00  ?  Pack years: 0.50  ?  Types: Cigarettes  ? Smokeless tobacco: Never  ? Tobacco comments:  ?  "5 per day"  ?Vaping Use  ? Vaping Use: Never used  ?Substance and Sexual Activity  ? Alcohol use: No  ? Drug use: No  ?  Comment: denies use 08/28/14  ? Sexual activity: Yes  ?  Birth control/protection: Implant  ?Other Topics Concern  ? Not on file  ?Social History Narrative  ? Lives with mother and brother, sister, daughter  ? ?Social Determinants of Health  ? ?Financial Resource Strain: Not on file  ?Food Insecurity: Not on file  ?Transportation Needs: Not on file  ?Physical Activity: Not on file  ?Stress: Not on file  ?Social Connections: Not on file  ? ? ?Family History  ?Problem Relation Age of Onset  ? Hypertension Mother   ? Diabetes Mother   ? Alcohol abuse Mother   ? COPD Mother   ? Drug abuse Mother   ? Cancer Maternal Grandmother   ?     throat  ? Cancer Maternal Grandfather   ?     throat  ? Diabetes Maternal Grandfather   ? ? ?Outpatient Encounter Medications as of 03/23/2022   ?Medication Sig  ? amLODipine (NORVASC) 10 MG tablet Take 1 tablet (10 mg total) by mouth daily.  ? Blood Glucose Monitoring Suppl (ACCU-CHEK GUIDE ME) w/Device KIT 1 Piece by Does not apply route as directed.  ? Etonogestrel (NEXPLANON [place]) Inject into the skin.  ? glipiZIDE (GLUCOTROL XL) 5 MG 24 hr tablet Take 1 tablet (5 mg total) by mouth daily with breakfast.  ? glucose blood (ACCU-CHEK GUIDE) test strip Use to monitor glucose 2 times a day  ? glucose blood test strip 1 each by Other route as needed. Use to test blood sugar twice daily as instructed  ? Lancets MISC USE TO CHECK BLOOD GLUCOSE TWICE DAILY  ? metFORMIN (GLUCOPHAGE) 500 MG tablet Take 1 tablet (500 mg total) by mouth 2 (two) times daily with a meal.  ? [DISCONTINUED] hydrochlorothiazide (HYDRODIURIL) 25 MG tablet Take 1 tablet (25 mg total) by mouth  daily. (Patient not taking: Reported on 03/05/2019)  ? [DISCONTINUED] norethindrone (MICRONOR,CAMILA,ERRIN) 0.35 MG tablet Take 1 tablet (0.35 mg total) by mouth daily.  ? ?No facility-administered encounter medications on file as of 03/23/2022.  ? ? ?ALLERGIES: ?Allergies  ?Allergen Reactions  ? Solu-Medrol [Methylprednisolone Sodium Succ]   ?  Rash, lip swelling ?  ? ? ?VACCINATION STATUS: ?Immunization History  ?Administered Date(s) Administered  ? HPV Quadrivalent 08/18/2008  ? Hepatitis A 11/17/2008  ? Hepatitis B 12/14/1997, 08/29/2004, 02/27/2005  ? Influenza Whole 09/22/2016  ? Influenza,inj,Quad PF,6+ Mos 11/08/2017, 08/19/2018  ? Influenza-Unspecified 12/06/2012  ? MMR 01/07/2019  ? Meningococcal Conjugate 08/18/2008  ? Pneumococcal Conjugate-13 11/08/2017  ? Pneumococcal Polysaccharide-23 01/07/2019  ? Tdap 09/16/2015, 11/25/2018  ? ? ?Diabetes ?She presents for her follow-up diabetic visit. She has type 2 diabetes mellitus. Onset time: Patient was diagnosed with type 2 diabetes at approximate age of 59.  She was seen prior to 2019 for the same diagnosis, however did not return for follow-up  appointment. Her disease course has been improving. There are no hypoglycemic associated symptoms. Pertinent negatives for hypoglycemia include no confusion, headaches, pallor or seizures. Associated symptoms include fatigue. Pertinent negatives for diabetes include no chest pain, no polydipsia, no polyphagia and no polyuria. There are no hypoglycemic complications. (Morbid obesity, chronic heavy smoking. ?) Risk factors for coronary artery disease include dyslipidemia, diabetes mellitus, obesity, tobacco exposure, sedentary lifestyle, hypertension and family history. Current diabetic treatments: She is on metformin 500 mg p.o. twice daily. Her weight is stable. She is following a generally unhealthy diet. When asked about meal planning, she reported none. Her home blood glucose trend is decreasing steadily. Her breakfast blood glucose range is generally 130-140 mg/dl. Her dinner blood glucose range is generally 130-140 mg/dl. Her bedtime blood glucose range is generally 130-140 mg/dl. Her overall blood glucose range is 130-140 mg/dl. (She presents with significant improvement in her glycemic profile averaging 135 over the last 30 days, 124 over the last 7 days.  This is in sharp turnaround from her recent A1c of 9.8%.  ) An ACE inhibitor/angiotensin II receptor blocker is not being taken.  ?Hypertension ?This is a chronic problem. The problem is controlled. Pertinent negatives include no chest pain, headaches, palpitations or shortness of breath. Risk factors for coronary artery disease include dyslipidemia, diabetes mellitus, obesity, family history, smoking/tobacco exposure and sedentary lifestyle. Past treatments include calcium channel blockers.  ?Hyperlipidemia ?This is a chronic problem. The problem is uncontrolled. Exacerbating diseases include diabetes and obesity. Pertinent negatives include no chest pain, myalgias or shortness of breath. Risk factors for coronary artery disease include dyslipidemia,  diabetes mellitus, hypertension, family history, obesity and a sedentary lifestyle.  ? ? ?Review of Systems  ?Constitutional:  Positive for fatigue. Negative for chills, fever and unexpected weight change.  ?HENT:  Negative for trouble swallowing and voice change.   ?Eyes:  Negative for visual disturbance.  ?Respiratory:  Negative for cough, shortness of breath and wheezing.   ?Cardiovascular:  Negative for chest pain, palpitations and leg swelling.  ?Gastrointestinal:  Negative for diarrhea, nausea and vomiting.  ?Endocrine: Negative for cold intolerance, heat intolerance, polydipsia, polyphagia and polyuria.  ?Musculoskeletal:  Negative for arthralgias and myalgias.  ?Skin:  Negative for color change, pallor, rash and wound.  ?Neurological:  Negative for seizures and headaches.  ?Psychiatric/Behavioral:  Negative for confusion and suicidal ideas.   ? ?Objective:  ?  ? ?  03/23/2022  ?  3:57 PM 03/20/2022  ?  4:03  PM 02/20/2022  ?  1:32 PM  ?Vitals with BMI  ?Height _0  _1  _2   ?Weight 340 lbs 10 oz 338 lbs 342 lbs 3 oz  ?BMI 62.28 61.81 60.63  ?Systolic 125 483 234  ?Diastolic 82 92 82  ?Pulse 108 104 92  ? ? ?BP 120/82   Pulse (!) 108   Ht _3  (1.575 m)   Wt (!) 340 lb 9.6 oz (154.5 kg)   LMP 03/14/2022 (Exact Date)   BMI 62.30 kg/m?   ?Wt Readings from Last 3 Encounters:  ?03/23/22 (!) 340 lb 9.6 oz (154.5 kg)  ?03/20/22 (!) 338 lb (153.3 kg)  ?02/20/22 (!) 342 lb 3.2 oz (155.2 kg)  ?  ? ?Physical Exam ?Constitutional:   ?   Appearance: She is well-developed.  ?HENT:  ?   Head: Normocephalic and atraumatic.  ?Neck:  ?   Thyroid: No thyromegaly.  ?   Trachea: No tracheal deviation.  ?Cardiovascular:  ?   Rate and Rhythm: Normal rate and regular rhythm.  ?Pulmonary:  ?   Effort: Pulmonary effort is normal.  ?Abdominal:  ?   Tenderness: There is no abdominal tenderness. There is no guarding.  ?Musculoskeletal:     ?   General: Normal range of motion.  ?   Cervical back: Normal range of motion and neck  supple.  ?Skin: ?   General: Skin is warm and dry.  ?   Coloration: Skin is not pale.  ?   Findings: No erythema or rash.  ?Neurological:  ?   Mental Status: She is alert and oriented to person, place, and time.  ?   Cra

## 2022-03-23 NOTE — Patient Instructions (Signed)

## 2022-04-12 ENCOUNTER — Encounter: Payer: Medicaid Other | Admitting: Obstetrics & Gynecology

## 2022-05-05 ENCOUNTER — Ambulatory Visit: Payer: Medicaid Other | Admitting: Obstetrics & Gynecology

## 2022-05-05 ENCOUNTER — Ambulatory Visit: Payer: Medicaid Other | Admitting: Adult Health

## 2022-05-25 ENCOUNTER — Ambulatory Visit: Payer: Medicaid Other | Admitting: "Endocrinology

## 2022-07-04 ENCOUNTER — Ambulatory Visit (INDEPENDENT_AMBULATORY_CARE_PROVIDER_SITE_OTHER): Payer: Medicaid Other | Admitting: "Endocrinology

## 2022-07-04 ENCOUNTER — Encounter: Payer: Self-pay | Admitting: "Endocrinology

## 2022-07-04 VITALS — BP 118/66 | HR 80 | Ht 62.0 in | Wt 330.0 lb

## 2022-07-04 DIAGNOSIS — I10 Essential (primary) hypertension: Secondary | ICD-10-CM

## 2022-07-04 DIAGNOSIS — E782 Mixed hyperlipidemia: Secondary | ICD-10-CM | POA: Diagnosis not present

## 2022-07-04 DIAGNOSIS — E119 Type 2 diabetes mellitus without complications: Secondary | ICD-10-CM | POA: Diagnosis not present

## 2022-07-04 DIAGNOSIS — F17200 Nicotine dependence, unspecified, uncomplicated: Secondary | ICD-10-CM | POA: Diagnosis not present

## 2022-07-04 LAB — POCT GLYCOSYLATED HEMOGLOBIN (HGB A1C): HbA1c, POC (controlled diabetic range): 8.1 % — AB (ref 0.0–7.0)

## 2022-07-04 MED ORDER — METFORMIN HCL 1000 MG PO TABS: 1000.0000 mg | ORAL_TABLET | Freq: Two times a day (BID) | ORAL | 1 refills | Status: DC

## 2022-07-04 NOTE — Progress Notes (Signed)
07/04/2022, 6:55 PM   Endocrinology follow-up note  Subjective:    Patient ID: Selena Barr, female    DOB: [birth year].  Selena Barr is being seen in follow-up after she was seen in consultation  for management of currently uncontrolled symptomatic diabetes .   Past Medical History:  Diagnosis Date   Allergy    Diabetes mellitus without complication ([place])    Hyperlipidemia 10/24/2016   Hypertension    Tobacco abuse 09/22/2016    Past Surgical History:  Procedure Laterality Date   WISDOM TOOTH EXTRACTION      Social History   Socioeconomic History   Marital status: Single    Spouse name: Not on file   Number of children: 1   Years of education: 12   Highest education level: Not on file  Occupational History    Comment: unemployed  Tobacco Use   Smoking status: Every Day    Packs/day: 0.25    Years: 2.00    Total pack years: 0.50    Types: Cigarettes   Smokeless tobacco: Never   Tobacco comments:    "5 per day"  Vaping Use   Vaping Use: Never used  Substance and Sexual Activity   Alcohol use: No   Drug use: No    Comment: denies use 08/28/14   Sexual activity: Yes    Birth control/protection: Implant  Other Topics Concern   Not on file  Social History Narrative   Lives with mother and brother, sister, daughter   Social Determinants of Health   Financial Resource Strain: Medium Risk (07/21/2018)   Overall Financial Resource Strain (CARDIA)    Difficulty of Paying Living Expenses: Somewhat hard  Food Insecurity: No Food Insecurity (07/25/2018)   Hunger Vital Sign    Worried About Running Out of Food in the Last Year: Never true    Ran Out of Food in the Last Year: Never true  Transportation Needs: No Transportation Needs (07/10/2018)   PRAPARE - Hydrologist (Medical): No    Lack of Transportation (Non-Medical): No  Physical Activity:  Sufficiently Active (07/22/2018)   Exercise Vital Sign    Days of Exercise per Week: 5 days    Minutes of Exercise per Session: 150+ min  Stress: No Stress Concern Present (07/15/2018)   [place]    Feeling of Stress : Not at all  Social Connections: Moderately Integrated (07/07/2018)   Social Connection and Isolation Panel [NHANES]    Frequency of Communication with Friends and Family: More than three times a week    Frequency of Social Gatherings with Friends and Family: More than three times a week    Attends Religious Services: 1 to 4 times per year    Active Member of [employer] or Organizations: No    Attends Archivist Meetings: Never    Marital Status: Living with partner    Family History  Problem Relation Age of Onset   Hypertension Mother    Diabetes Mother    Alcohol abuse Mother    COPD Mother  Drug abuse Mother    Cancer Maternal Grandmother        throat   Cancer Maternal Grandfather        throat   Diabetes Maternal Grandfather     Outpatient Encounter Medications as of 07/04/2022  Medication Sig   amLODipine (NORVASC) 10 MG tablet Take 1 tablet (10 mg total) by mouth daily.   Blood Glucose Monitoring Suppl (ACCU-CHEK GUIDE ME) w/Device KIT 1 Piece by Does not apply route as directed.   Etonogestrel (NEXPLANON ) Inject into the skin.   glipiZIDE (GLUCOTROL XL) 5 MG 24 hr tablet Take 1 tablet (5 mg total) by mouth daily with breakfast.   glucose blood (ACCU-CHEK GUIDE) test strip Use to monitor glucose 2 times a day   glucose blood test strip 1 each by Other route as needed. Use to test blood sugar twice daily as instructed   Lancets MISC USE TO CHECK BLOOD GLUCOSE TWICE DAILY   metFORMIN (GLUCOPHAGE) 1000 MG tablet Take 1 tablet (1,000 mg total) by mouth 2 (two) times daily with a meal.   [DISCONTINUED] hydrochlorothiazide (HYDRODIURIL) 25 MG tablet Take 1 tablet (25 mg total) by mouth  daily. (Patient not taking: Reported on 03/05/2019)   [DISCONTINUED] metFORMIN (GLUCOPHAGE) 500 MG tablet Take 1 tablet (500 mg total) by mouth 2 (two) times daily with a meal.   [DISCONTINUED] norethindrone (MICRONOR,CAMILA,ERRIN) 0.35 MG tablet Take 1 tablet (0.35 mg total) by mouth daily.   No facility-administered encounter medications on file as of 07/04/2022.    ALLERGIES: Allergies  Allergen Reactions   Solu-Medrol [Methylprednisolone Sodium Succ]     Rash, lip swelling     VACCINATION STATUS: Immunization History  Administered Date(s) Administered   HPV Quadrivalent 08/18/2008   Hepatitis A 11/17/2008   Hepatitis B 12/14/1997, 08/29/2004, 02/27/2005   Influenza Whole 09/22/2016   Influenza,inj,Quad PF,6+ Mos 11/08/2017, 08/19/2018   Influenza-Unspecified 12/06/2012   MMR 01/07/2019   Meningococcal Conjugate 08/18/2008   Pneumococcal Conjugate-13 11/08/2017   Pneumococcal Polysaccharide-23 01/07/2019   Tdap 09/16/2015, 11/25/2018    Diabetes She presents for her follow-up diabetic visit. She has type 2 diabetes mellitus. Onset time: Patient was diagnosed with type 2 diabetes at approximate age of 66.  She was seen prior to 2019 for the same diagnosis, however did not return for follow-up appointment. Her disease course has been improving. There are no hypoglycemic associated symptoms. Pertinent negatives for hypoglycemia include no confusion, headaches, pallor or seizures. Associated symptoms include fatigue. Pertinent negatives for diabetes include no chest pain, no polydipsia, no polyphagia and no polyuria. There are no hypoglycemic complications. Symptoms are improving. (Morbid obesity, chronic heavy smoking. ) Risk factors for coronary artery disease include dyslipidemia, diabetes mellitus, obesity, tobacco exposure, sedentary lifestyle, hypertension and family history. Current diabetic treatments: She is on metformin 500 mg p.o. twice daily. Her weight is decreasing steadily  (she lost significant amount of weight , from 374 to 330.). She is following a generally unhealthy diet. When asked about meal planning, she reported none. Her home blood glucose trend is decreasing steadily. Her breakfast blood glucose range is generally 140-180 mg/dl. Her overall blood glucose range is 140-180 mg/dl. (She presents with significant improvement in her glycemic profile  , POC is 8.1 % improving from 9.8%. ) An ACE inhibitor/angiotensin II receptor blocker is not being taken.  Hypertension This is a chronic problem. The problem is controlled. Pertinent negatives include no chest pain, headaches, palpitations or shortness of breath. Risk factors for coronary artery  disease include dyslipidemia, diabetes mellitus, obesity, family history, smoking/tobacco exposure and sedentary lifestyle. Past treatments include calcium channel blockers.  Hyperlipidemia This is a chronic problem. The problem is uncontrolled. Exacerbating diseases include diabetes and obesity. Pertinent negatives include no chest pain, myalgias or shortness of breath. Risk factors for coronary artery disease include dyslipidemia, diabetes mellitus, hypertension, family history, obesity and a sedentary lifestyle.     Review of Systems  Constitutional:  Positive for fatigue. Negative for chills, fever and unexpected weight change.  HENT:  Negative for trouble swallowing and voice change.   Eyes:  Negative for visual disturbance.  Respiratory:  Negative for cough, shortness of breath and wheezing.   Cardiovascular:  Negative for chest pain, palpitations and leg swelling.  Gastrointestinal:  Negative for diarrhea, nausea and vomiting.  Endocrine: Negative for cold intolerance, heat intolerance, polydipsia, polyphagia and polyuria.  Musculoskeletal:  Negative for arthralgias and myalgias.  Skin:  Negative for color change, pallor, rash and wound.  Neurological:  Negative for seizures and headaches.  Psychiatric/Behavioral:   Negative for confusion and suicidal ideas.     Objective:       07/04/2022    3:37 PM 03/23/2022    3:57 PM 03/20/2022    4:03 PM  Vitals with BMI  Height 5' 2"  5' 2"  5' 2"   Weight 330 lbs 340 lbs 10 oz 338 lbs  BMI 60.34 42.87 68.11  Systolic 572 620 355  Diastolic 66 82 92  Pulse 80 108 104    BP 118/66   Pulse 80   Ht 5' 2"  (1.575 m)   Wt (!) 330 lb (149.7 kg)   BMI 60.36 kg/m   Wt Readings from Last 3 Encounters:  07/04/22 (!) 330 lb (149.7 kg)  03/23/22 (!) 340 lb 9.6 oz (154.5 kg)  03/20/22 (!) 338 lb (153.3 kg)      CMP ( most recent) CMP     Component Value Date/Time   NA 137 01/30/2022 1444   K 4.8 01/30/2022 1444   CL 100 01/30/2022 1444   CO2 25 01/30/2022 1444   GLUCOSE 143 (H) 01/30/2022 1444   GLUCOSE 92 10/18/2018 0825   BUN 10 01/30/2022 1444   CREATININE 0.88 01/30/2022 1444   CREATININE 0.70 12/19/2017 0900   CALCIUM 9.3 01/30/2022 1444   PROT 7.6 01/30/2022 1444   ALBUMIN 4.1 01/30/2022 1444   AST 14 01/30/2022 1444   ALT 20 01/30/2022 1444   ALKPHOS 110 01/30/2022 1444   BILITOT 0.2 01/30/2022 1444   GFRNONAA >60 10/18/2018 0825   GFRNONAA 124 11/08/2017 1129   GFRAA >60 10/18/2018 0825   GFRAA 144 11/08/2017 1129     Diabetic Labs (most recent): Lab Results  Component Value Date   HGBA1C 8.1 (A) 07/04/2022   HGBA1C 9.8 (H) 01/30/2022   HGBA1C 5.9 (H) 07/18/2018   MICROALBUR 1.7 11/08/2017   MICROALBUR 0.7 10/23/2016     Lipid Panel ( most recent) Lipid Panel     Component Value Date/Time   CHOL 181 11/08/2017 1129   TRIG 112 11/08/2017 1129   HDL 28 (L) 11/08/2017 1129   CHOLHDL 6.5 (H) 11/08/2017 1129   VLDL 21 01/24/2017 1055   LDLCALC 131 (H) 11/08/2017 1129      Lab Results  Component Value Date   TSH 0.566 01/30/2022   TSH 1.17 12/19/2017   TSH 0.63 10/23/2016   FREET4 1.7 12/19/2017       Assessment & Plan:   1. Type 2 diabetes mellitus  without complication, without long-term current use of insulin  ([place])  - Selena Barr has currently uncontrolled symptomatic type 2 DM since  31 years of age.  She presents with significant improvement in her glycemic profile  , POC is 8.1 % improving from 9.8%.   Recent labs reviewed. - I had a long discussion with her about the progressive nature of diabetes and the pathology behind its complications. -her diabetes is complicated by morbid obesity, smoking, and she remains at a high risk for more acute and chronic complications which include CAD, CVA, CKD, retinopathy, and neuropathy. These are all discussed in detail with her.  - I discussed all available options of managing her diabetes including de-escalation of medications. I have counseled her on diet  and weight management  by adopting a Whole Food , Plant Predominant  ( WFPP) nutrition as recommended by [employer] of Lifestyle Medicine. Patient is encouraged to switch to  unprocessed or minimally processed  complex starch, adequate protein intake (mainly plant source), minimal liquid fat ( mainly vegetable oils), plenty of fruits, and vegetables. -  she is advised to stick to a routine mealtimes to eat 3 complete meals a day and snack only when necessary ( to snack only to correct hypoglycemia BG <70 day time or <100 at night).  -She is attempting to incorporate lifestyle changes.  she acknowledges that there is a room for improvement in her food and drink choices. - Suggestion is made for her to avoid simple carbohydrates  from her diet including Cakes, Sweet Desserts, Ice Cream, Soda (diet and regular), Sweet Tea, Candies, Chips, Cookies, Store Bought Juices, Alcohol , Artificial Sweeteners,  Coffee Creamer, and "Sugar-free" Products, Lemonade. This will help patient to have more stable blood glucose profile and potentially avoid unintended weight gain.  The following Lifestyle Medicine recommendations according to [place]  [facility]) were discussed and and offered  to patient and she  agrees to start the journey:  A. Whole Foods, Plant-Based Nutrition comprising of fruits and vegetables, plant-based proteins, whole-grain carbohydrates was discussed in detail with the patient.   A list for source of those nutrients were also provided to the patient.  Patient will use only water or unsweetened tea for hydration. B.  The need to stay away from risky substances including alcohol, smoking; obtaining 7 to 9 hours of restorative sleep, at least 150 minutes of moderate intensity exercise weekly, the importance of healthy social connections,  and stress management techniques were discussed. C.  A full color page of  Calorie density of various food groups per pound showing examples of each food groups was provided to the patient.   - she will be scheduled with Jearld Fenton, RDN, CDE for individualized diabetes education.  - I have approached her with the following plan to manage  her diabetes and patient agrees:  -In light of her presentation with controlled glycemia to target, she will not need insulin therapy for now.    -She is advised to continue her current medications including metformin 1000 mg p.o. twice daily, glipizide 5 mg XL p.o. daily at breakfast.  - she is advised to continue monitor blood glucose at least twice a day-before breakfast and at bedtime and return in 9 weeks with her meter and logs.  - she is encouraged to call clinic for blood glucose levels less than 70 or above 200 mg /dl.  - she will be considered for incretin therapy as appropriate next visit.  -  Specific targets for  A1c;  LDL, HDL,  and Triglycerides were discussed with the patient.  2) Blood Pressure /Hypertension:  -Her blood pressure is controlled to target.  she is advised to continue her current medications including amlodipine 10 mg p.o. daily with breakfast .  Will be considered for ACE inhibitor or ARB on subsequent visits. 3) Lipids/Hyperlipidemia:   Review of her  recent lipid panel showed uncontrolled  LDL at 131 .  she  is not on statins.  She will be considered for statin intervention on her next visit.  This is due to the fact that she is hesitant to escalate medications at the moment.  WF PB diet will help, she will need fasting lipid panel on subsequent visits.   4)  Weight/Diet:  Body mass index is 60.36 kg/m.  -   lost 44 lbs so fr , clearly complicating her diabetes care.   she is  a candidate for weight loss. I discussed with her the fact that loss of 5 - 10% of her  current body weight will have the most impact on her diabetes management.  The above detailed  ACLM recommendations for nutrition, exercise, sleep, social life, avoidance of risky substances, the need for restorative sleep   information will also detailed on discharge instructions.  5) Chronic Care/Health Maintenance:  -she  is not  on ACEI/ARB and Statin medications and  is encouraged to initiate and continue to follow up with Ophthalmology, Dentist,  Podiatrist at least yearly or according to recommendations, and advised to  quit smoking. I have recommended yearly flu vaccine and pneumonia vaccine at least every 5 years; moderate intensity exercise for up to 150 minutes weekly; and  sleep for 7- 9 hours a day.  - she is  advised to maintain close follow up with Pcp, No for primary care needs, as well as her other providers for optimal and coordinated care.     I spent 44 minutes in the care of the patient today including review of labs from [facility], Lipids, Thyroid Function, Hematology (current and previous including abstractions from other facilities); face-to-face time discussing  her blood glucose readings/logs, discussing hypoglycemia and hyperglycemia episodes and symptoms, medications doses, her options of short and long term treatment based on the latest standards of care / guidelines;  discussion about incorporating lifestyle medicine;  and documenting the encounter. Risk reduction  counseling performed per USPSTF guidelines to reduce obesity and cardiovascular risk factors.     Please refer to Patient Instructions for Blood Glucose Monitoring and Insulin/Medications Dosing Guide"  in media tab for additional information. Please  also refer to " Patient Self Inventory" in the Media  tab for reviewed elements of pertinent patient history.  [place] C Sukhu participated in the discussions, expressed understanding, and voiced agreement with the above plans.  All questions were answered to her satisfaction. she is encouraged to contact clinic should she have any questions or concerns prior to her return visit.     Follow up plan: - Return in about 3 months (around 10/04/2022) for F/U with Pre-visit Labs, Meter/CGM/Logs, A1c here.  Glade Lloyd, MD [facility] [address] [place], [place] [address] Phone: [phone]  Fax: [phone]    07/04/2022, 6:55 PM  This note was partially dictated with voice recognition software. Similar sounding words can be transcribed inadequately or may not  be corrected upon review.

## 2022-07-04 NOTE — Patient Instructions (Signed)

## 2022-10-04 ENCOUNTER — Ambulatory Visit: Payer: Medicaid Other | Admitting: "Endocrinology

## 2022-10-13 ENCOUNTER — Ambulatory Visit
Admission: EM | Admit: 2022-10-13 | Discharge: 2022-10-13 | Disposition: A | Discharge status: Home / Self Care | Payer: Medicaid Other | Attending: Nurse Practitioner | Admitting: Nurse Practitioner

## 2022-10-13 DIAGNOSIS — Z1152 Encounter for screening for COVID-19: Secondary | ICD-10-CM | POA: Insufficient documentation

## 2022-10-13 DIAGNOSIS — J069 Acute upper respiratory infection, unspecified: Secondary | ICD-10-CM | POA: Insufficient documentation

## 2022-10-13 DIAGNOSIS — R062 Wheezing: Secondary | ICD-10-CM | POA: Diagnosis not present

## 2022-10-13 LAB — RESP PANEL BY RT-PCR (FLU A&B, COVID) ARPGX2
Influenza A by PCR: NEGATIVE
Influenza B by PCR: NEGATIVE
SARS Coronavirus 2 by RT PCR: NEGATIVE

## 2022-10-13 MED ORDER — ALBUTEROL SULFATE (2.5 MG/3ML) 0.083% IN NEBU
2.5000 mg | INHALATION_SOLUTION | Freq: Once | RESPIRATORY_TRACT | Status: AC
  Administered 2022-10-13: 2.5 mg via RESPIRATORY_TRACT

## 2022-10-13 MED ORDER — VENTOLIN HFA 108 (90 BASE) MCG/ACT IN AERS: 1.0000 | INHALATION_SPRAY | Freq: Four times a day (QID) | RESPIRATORY_TRACT | 0 refills | Status: DC | PRN

## 2022-10-13 NOTE — ED Triage Notes (Signed)
Pt states cough and sore throat since yesterday.

## 2022-10-13 NOTE — ED Provider Notes (Signed)
[facility]    CSN: [id number] Arrival date & time: 10/13/22  1543      History   Chief Complaint Chief Complaint  Patient presents with   Cough    HPI Selena Barr is a 31 y.o. female.   Patient presents for 1 day history of congested cough, shortness of breath after coughing, wheezing and chest pain after coughing attack, sore throat, headache yesterday that is now better, decreased appetite yesterday that is now better.  She denies fever, body aches, chills, chest or nasal congestion, runny nose, postnasal drainage, ear pain or pressure, abdominal pain, nausea/vomiting, diarrhea, loss of taste or smell, new rash, and fatigue.  No known sick contacts.  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  Blood pressure elevated today in urgent care.  Reports she does not take blood pressure medication anymore.  Does not check blood pressure on a regular basis.  Today, she denies vision changes, headache, dizziness/lightheadedness, chest pain, shortness of breath.    Past Medical History:  Diagnosis Date   Allergy    Diabetes mellitus without complication ([place])    Hyperlipidemia 10/24/2016   Hypertension    Tobacco abuse 09/22/2016    Patient Active Problem List   Diagnosis Date Noted   Nexplanon insertion 02-08-2022   General counseling and advice for contraceptive management 01/30/2022   Pregnancy test negative 01/30/2022   Screen for STD (sexually transmitted disease) 01/30/2022   Routine cervical smear 01/30/2022   Menorrhagia with irregular cycle 01/30/2022   History of gestational diabetes 01/30/2022   Essential hypertension, benign 06/11/2020   Postpartum hypertension 2019-02-09   Death of infant 2018-07-29   Mixed hyperlipidemia 10/24/2016   Type 2 diabetes mellitus without complication, without long-term current use of insulin ([place]) 09/22/2016   Morbid obesity ([place]) 09/22/2016   Current smoker 09/22/2016    Past Surgical History:   Procedure Laterality Date   WISDOM TOOTH EXTRACTION      OB History     Gravida  3   Para  3   Term  3   Preterm      AB      Living  2      SAB      IAB      Ectopic      Multiple      Live Births  3            Home Medications    Prior to Admission medications   Medication Sig Start Date End Date Taking? Authorizing Provider  albuterol (VENTOLIN HFA) 108 (90 Base) MCG/ACT inhaler Inhale 1 puff into the lungs every 6 (six) hours as needed for wheezing or shortness of breath. 10/13/22  Yes Noemi Chapel A, NP  amLODipine (NORVASC) 10 MG tablet Take 1 tablet (10 mg total) by mouth daily. 01/30/22   Estill Dooms, NP  Blood Glucose Monitoring Suppl (ACCU-CHEK GUIDE ME) w/Device KIT 1 Piece by Does not apply route as directed. 02/20/22   Cassandria Anger, MD  Etonogestrel (NEXPLANON ) Inject into the skin.    [provider]  glipiZIDE (GLUCOTROL XL) 5 MG 24 hr tablet Take 1 tablet (5 mg total) by mouth daily with breakfast. 02/20/22   Nida, Marella Chimes, MD  glucose blood (ACCU-CHEK GUIDE) test strip Use to monitor glucose 2 times a day 02/21/22   Cassandria Anger, MD  glucose blood test strip 1 each by Other route as needed. Use to test blood  sugar twice daily as instructed    [provider]  Lancets MISC USE TO CHECK BLOOD GLUCOSE TWICE DAILY 02/21/22   Cassandria Anger, MD  metFORMIN (GLUCOPHAGE) 1000 MG tablet Take 1 tablet (1,000 mg total) by mouth 2 (two) times daily with a meal. 07/04/22   Nida, Marella Chimes, MD  hydrochlorothiazide (HYDRODIURIL) 25 MG tablet Take 1 tablet (25 mg total) by mouth daily. Patient not taking: Reported on 03/05/2019 02/06/19 05/06/20  Roma Schanz, CNM  norethindrone (MICRONOR,CAMILA,ERRIN) 0.35 MG tablet Take 1 tablet (0.35 mg total) by mouth daily. 02/03/19 03/06/20  Roma Schanz, CNM    Family History Family History  Problem Relation Age of Onset   Hypertension Mother     Diabetes Mother    Alcohol abuse Mother    COPD Mother    Drug abuse Mother    Cancer Maternal Grandmother        throat   Cancer Maternal Grandfather        throat   Diabetes Maternal Grandfather     Social History Social History   Tobacco Use   Smoking status: Every Day    Packs/day: 0.25    Years: 2.00    Total pack years: 0.50    Types: Cigarettes   Smokeless tobacco: Never   Tobacco comments:    "5 per day"  Vaping Use   Vaping Use: Never used  Substance Use Topics   Alcohol use: No   Drug use: No    Comment: denies use 08/28/14     Allergies   Solu-medrol [methylprednisolone sodium succ]   Review of Systems Review of Systems Per HPI  Physical Exam Triage Vital Signs ED Triage Vitals  Enc Vitals Group     BP 10/13/22 1553 (!) 161/94     Pulse Rate 10/13/22 1553 90     Resp 10/13/22 1553 16     Temp 10/13/22 1553 98.6 F (37 C)     Temp Source 10/13/22 1553 Oral     SpO2 10/13/22 1553 96 %     Weight --      Height --      Head Circumference --      Peak Flow --      Pain Score 10/13/22 1554 7     Pain Loc --      Pain Edu? --      Excl. in [place]? --    No data found.  Updated Vital Signs BP (!) 140/95 (BP Location: Right Arm)   Pulse 90   Temp 98.6 F (37 C) (Oral)   Resp 16   SpO2 96%   Visual Acuity Right Eye Distance:   Left Eye Distance:   Bilateral Distance:    Right Eye Near:   Left Eye Near:    Bilateral Near:     Physical Exam Vitals and nursing note reviewed.  Constitutional:      General: She is not in acute distress.    Appearance: Normal appearance. She is not ill-appearing or toxic-appearing.  HENT:     Head: Normocephalic and atraumatic.     Right Ear: Tympanic membrane, ear canal and external ear normal.     Left Ear: Tympanic membrane, ear canal and external ear normal.     Nose: Rhinorrhea present. No congestion.     Mouth/Throat:     Mouth: Mucous membranes are moist.     Pharynx: Oropharynx is clear.  Posterior oropharyngeal erythema present. No oropharyngeal exudate.  Eyes:  General: No scleral icterus.    Extraocular Movements: Extraocular movements intact.  Cardiovascular:     Rate and Rhythm: Normal rate and regular rhythm.  Pulmonary:     Effort: Pulmonary effort is normal. No respiratory distress.     Breath sounds: Wheezing present. No rhonchi or rales.  Abdominal:     General: Abdomen is flat. Bowel sounds are normal. There is no distension.     Palpations: Abdomen is soft.     Tenderness: There is no abdominal tenderness. There is no guarding.  Musculoskeletal:     Cervical back: Normal range of motion and neck supple.  Lymphadenopathy:     Cervical: No cervical adenopathy.  Skin:    General: Skin is warm and dry.     Coloration: Skin is not jaundiced or pale.     Findings: No erythema or rash.  Neurological:     Mental Status: She is alert and oriented to person, place, and time.  Psychiatric:        Behavior: Behavior is cooperative.      UC Treatments / Results  Labs (all labs ordered are listed, but only abnormal results are displayed) Labs Reviewed  RESP PANEL BY RT-PCR (FLU A&B, COVID) [id number]    EKG   Radiology No results found.  Procedures Procedures (including critical care time)  Medications Ordered in UC Medications  albuterol (PROVENTIL) (2.5 MG/3ML) 0.083% nebulizer solution 2.5 mg (2.5 mg Nebulization Given 10/13/22 1629)    Initial Impression / Assessment and Plan / UC Course  I have reviewed the triage vital signs and the nursing notes.  Pertinent labs & imaging results that were available during my care of the patient were reviewed by me and considered in my medical decision making (see chart for details).   Patient is well-appearing, normotensive, afebrile, not tachycardic, not tachypneic, oxygenating well on room air.  Blood pressure recheck much improved in urgent care.  Encounter for screening for COVID-19 Viral URI with  cough Wheezing Suspect viral etiology COVID-19, influenza testing obtained Patient is a good candidate for molnupiravir or Tamiflu if she test positive Breathing treatment given with full relief of wheezing Albuterol inhaler given for symptomatic treatment Supportive care discussed ER and return precautions discussed Note given for work  The patient was given the opportunity to ask questions.  All questions answered to their satisfaction.  The patient is in agreement to this plan.     Final Clinical Impressions(s) / UC Diagnoses   Final diagnoses:  Encounter for screening for COVID-19  Viral URI with cough  Wheezing     Discharge Instructions      You have a viral upper respiratory infection.  Symptoms should improve over the next week to 10 days.  If you develop chest pain or shortness of breath, go to the emergency room.  We have tested you today for COVID-19 and influenza.  You will see the results in Mychart and we will call you with positive results.    Please stay home and isolate until you are aware of the results.    We have given you a breathing treatment today which helped with the wheezing.  Continue using the albuterol inhaler every 4-6 hours as needed for wheezing or shortness of breath.  Some things that can make you feel better are: - Increased rest - Increasing fluid with water/sugar free electrolytes - Acetaminophen and ibuprofen as needed for fever/pain - Salt water gargling, chloraseptic spray and throat lozenges - OTC guaifenesin (Mucinex)  600 mg twice daily - Saline sinus flushes or a neti pot - Humidifying the air - Albuterol inhaler every 4-6 hours as needed for wheezing/shortness of breath     ED Prescriptions     Medication Sig Dispense Auth. Provider   albuterol (VENTOLIN HFA) 108 (90 Base) MCG/ACT inhaler Inhale 1 puff into the lungs every 6 (six) hours as needed for wheezing or shortness of breath. 54 g Eulogio Bear, NP      PDMP  not reviewed this encounter.   Eulogio Bear, NP 10/13/22 [phone]

## 2022-10-13 NOTE — Discharge Instructions (Signed)
You have a viral upper respiratory infection.  Symptoms should improve over the next week to 10 days.  If you develop chest pain or shortness of breath, go to the emergency room.  We have tested you today for COVID-19 and influenza.  You will see the results in Mychart and we will call you with positive results.    Please stay home and isolate until you are aware of the results.    We have given you a breathing treatment today which helped with the wheezing.  Continue using the albuterol inhaler every 4-6 hours as needed for wheezing or shortness of breath.  Some things that can make you feel better are: - Increased rest - Increasing fluid with water/sugar free electrolytes - Acetaminophen and ibuprofen as needed for fever/pain - Salt water gargling, chloraseptic spray and throat lozenges - OTC guaifenesin (Mucinex) 600 mg twice daily - Saline sinus flushes or a neti pot - Humidifying the air - Albuterol inhaler every 4-6 hours as needed for wheezing/shortness of breath

## 2022-10-18 ENCOUNTER — Ambulatory Visit: Payer: Medicaid Other | Admitting: "Endocrinology

## 2022-10-18 DIAGNOSIS — E119 Type 2 diabetes mellitus without complications: Secondary | ICD-10-CM | POA: Diagnosis not present

## 2022-10-19 LAB — COMPREHENSIVE METABOLIC PANEL
ALT: 16 IU/L (ref 0–32)
AST: 16 IU/L (ref 0–40)
Albumin/Globulin Ratio: 1.2 (ref 1.2–2.2)
Albumin: 3.9 g/dL — ABNORMAL LOW (ref 4.0–5.0)
Alkaline Phosphatase: 93 IU/L (ref 44–121)
BUN/Creatinine Ratio: 13 (ref 9–23)
BUN: 10 mg/dL (ref 6–20)
Bilirubin Total: 0.3 mg/dL (ref 0.0–1.2)
CO2: 26 mmol/L (ref 20–29)
Calcium: 9.1 mg/dL (ref 8.7–10.2)
Chloride: 101 mmol/L (ref 96–106)
Creatinine, Ser: 0.77 mg/dL (ref 0.57–1.00)
Globulin, Total: 3.2 g/dL (ref 1.5–4.5)
Glucose: 148 mg/dL — ABNORMAL HIGH (ref 70–99)
Potassium: 4.2 mmol/L (ref 3.5–5.2)
Sodium: 139 mmol/L (ref 134–144)
Total Protein: 7.1 g/dL (ref 6.0–8.5)
eGFR: 106 mL/min/{1.73_m2} (ref >59)

## 2022-10-19 LAB — LIPID PANEL
Chol/HDL Ratio: 5.3 ratio — ABNORMAL HIGH (ref 0.0–4.4)
Cholesterol, Total: 155 mg/dL (ref 100–199)
HDL: 29 mg/dL — ABNORMAL LOW (ref >39)
LDL Chol Calc (NIH): 108 mg/dL — ABNORMAL HIGH (ref 0–99)
Triglycerides: 97 mg/dL (ref 0–149)
VLDL Cholesterol Cal: 18 mg/dL (ref 5–40)

## 2022-10-19 LAB — T4, FREE: Free T4: 1.53 ng/dL (ref 0.82–1.77)

## 2022-10-19 LAB — TSH: TSH: 0.595 u[IU]/mL (ref 0.450–4.500)

## 2022-10-20 ENCOUNTER — Ambulatory Visit (INDEPENDENT_AMBULATORY_CARE_PROVIDER_SITE_OTHER): Payer: Medicaid Other | Admitting: "Endocrinology

## 2022-10-20 ENCOUNTER — Encounter: Payer: Self-pay | Admitting: "Endocrinology

## 2022-10-20 VITALS — BP 126/87 | HR 91 | Ht 62.0 in | Wt 329.0 lb

## 2022-10-20 DIAGNOSIS — E782 Mixed hyperlipidemia: Secondary | ICD-10-CM

## 2022-10-20 DIAGNOSIS — E119 Type 2 diabetes mellitus without complications: Secondary | ICD-10-CM

## 2022-10-20 DIAGNOSIS — I10 Essential (primary) hypertension: Secondary | ICD-10-CM | POA: Diagnosis not present

## 2022-10-20 LAB — POCT GLYCOSYLATED HEMOGLOBIN (HGB A1C): HbA1c, POC (controlled diabetic range): 7.7 % — AB (ref 0.0–7.0)

## 2022-10-20 MED ORDER — SEMAGLUTIDE (1 MG/DOSE) 4 MG/3ML ~~LOC~~ SOPN: 1.0000 mg | PEN_INJECTOR | SUBCUTANEOUS | 1 refills | Status: DC

## 2022-10-20 NOTE — Patient Instructions (Signed)

## 2022-10-20 NOTE — Progress Notes (Signed)
10/20/2022, 2:09 PM   Endocrinology follow-up note  Subjective:    Patient ID: Selena Barr, female    DOB: 09-14-1991.  Selena Barr is being seen in follow-up after she was seen in consultation  for management of currently uncontrolled symptomatic diabetes .   Past Medical History:  Diagnosis Date  . Allergy   . Diabetes mellitus without complication ([place])   . Hyperlipidemia 10/24/2016  . Hypertension   . Tobacco abuse 09/22/2016    Past Surgical History:  Procedure Laterality Date  . WISDOM TOOTH EXTRACTION      Social History   Socioeconomic History  . Marital status: Single    Spouse name: Not on file  . Number of children: 1  . Years of education: 48  . Highest education level: Not on file  Occupational History    Comment: unemployed  Tobacco Use  . Smoking status: Every Day    Packs/day: 0.25    Years: 2.00    Total pack years: 0.50    Types: Cigarettes  . Smokeless tobacco: Never  . Tobacco comments:    "5 per day"  Vaping Use  . Vaping Use: Never used  Substance and Sexual Activity  . Alcohol use: No  . Drug use: No    Comment: denies use 08/28/14  . Sexual activity: Yes    Birth control/protection: Implant  Other Topics Concern  . Not on file  Social History Narrative   Lives with mother and brother, sister, daughter   Social Determinants of Health   Financial Resource Strain: Medium Risk (07/16/2018)   Overall Financial Resource Strain (CARDIA)   . Difficulty of Paying Living Expenses: Somewhat hard  Food Insecurity: No Food Insecurity (07/07/2018)   Hunger Vital Sign   . Worried About Charity fundraiser in the Last Year: Never true   . Ran Out of Food in the Last Year: Never true  Transportation Needs: No Transportation Needs (07/24/2018)   PRAPARE - Transportation   . Lack of Transportation (Medical): No   . Lack of Transportation (Non-Medical):  No  Physical Activity: Sufficiently Active (07/09/2018)   Exercise Vital Sign   . Days of Exercise per Week: 5 days   . Minutes of Exercise per Session: 150+ min  Stress: No Stress Concern Present (07/27/2018)   [place]   . Feeling of Stress : Not at all  Social Connections: Moderately Integrated (07/31/2018)   Social Connection and Isolation Panel [NHANES]   . Frequency of Communication with Friends and Family: More than three times a week   . Frequency of Social Gatherings with Friends and Family: More than three times a week   . Attends Religious Services: 1 to 4 times per year   . Active Member of Clubs or Organizations: No   . Attends Archivist Meetings: Never   . Marital Status: Living with partner    Family History  Problem Relation Age of Onset  . Hypertension Mother   . Diabetes Mother   . Alcohol abuse Mother   . COPD Mother   .  Drug abuse Mother   . Cancer Maternal Grandmother        throat  . Cancer Maternal Grandfather        throat  . Diabetes Maternal Grandfather     Outpatient Encounter Medications as of 10/20/2022  Medication Sig  . Semaglutide, 1 MG/DOSE, 4 MG/3ML SOPN Inject 1 mg as directed once a week.  Marland Kitchen albuterol (VENTOLIN HFA) 108 (90 Base) MCG/ACT inhaler Inhale 1 puff into the lungs every 6 (six) hours as needed for wheezing or shortness of breath.  Marland Kitchen amLODipine (NORVASC) 10 MG tablet Take 1 tablet (10 mg total) by mouth daily.  . Blood Glucose Monitoring Suppl (ACCU-CHEK GUIDE ME) w/Device KIT 1 Piece by Does not apply route as directed.  . Etonogestrel (NEXPLANON [place]) Inject into the skin.  Marland Kitchen glipiZIDE (GLUCOTROL XL) 5 MG 24 hr tablet Take 1 tablet (5 mg total) by mouth daily with breakfast.  . glucose blood (ACCU-CHEK GUIDE) test strip Use to monitor glucose 2 times a day  . glucose blood test strip 1 each by Other route as needed. Use to test blood sugar twice daily as  instructed  . Lancets MISC USE TO CHECK BLOOD GLUCOSE TWICE DAILY  . metFORMIN (GLUCOPHAGE) 1000 MG tablet Take 1 tablet (1,000 mg total) by mouth 2 (two) times daily with a meal.  . [DISCONTINUED] hydrochlorothiazide (HYDRODIURIL) 25 MG tablet Take 1 tablet (25 mg total) by mouth daily. (Patient not taking: Reported on 03/05/2019)  . [DISCONTINUED] norethindrone (MICRONOR,CAMILA,ERRIN) 0.35 MG tablet Take 1 tablet (0.35 mg total) by mouth daily.   No facility-administered encounter medications on file as of 10/20/2022.    ALLERGIES: Allergies  Allergen Reactions  . Solu-Medrol [Methylprednisolone Sodium Succ]     Rash, lip swelling     VACCINATION STATUS: Immunization History  Administered Date(s) Administered  . HPV Quadrivalent 08/18/2008  . Hepatitis A 11/17/2008  . Hepatitis B 12/14/1997, 08/29/2004, 02/27/2005  . Influenza Whole 09/22/2016  . Influenza,inj,Quad PF,6+ Mos 11/08/2017, 08/19/2018  . Influenza-Unspecified 12/06/2012  . MMR 01/07/2019  . Meningococcal Conjugate 08/18/2008  . Pneumococcal Conjugate-13 11/08/2017  . Pneumococcal Polysaccharide-23 01/07/2019  . Tdap 09/16/2015, 11/25/2018    Diabetes She presents for her follow-up diabetic visit. She has type 2 diabetes mellitus. Onset time: Patient was diagnosed with type 2 diabetes at approximate age of 13.  She was seen prior to 2019 for the same diagnosis, however did not return for follow-up appointment. Her disease course has been improving. There are no hypoglycemic associated symptoms. Pertinent negatives for hypoglycemia include no confusion, headaches, pallor or seizures. Associated symptoms include fatigue. Pertinent negatives for diabetes include no chest pain, no polydipsia, no polyphagia and no polyuria. There are no hypoglycemic complications. Symptoms are improving. (Morbid obesity, chronic heavy smoking. ) Risk factors for coronary artery disease include dyslipidemia, diabetes mellitus, obesity,  tobacco exposure, sedentary lifestyle, hypertension and family history. Current diabetic treatments: She is on metformin 500 mg p.o. twice daily. Her weight is decreasing steadily (she lost significant amount of weight , from 374 to 330.). She is following a generally unhealthy diet. When asked about meal planning, she reported none. Her home blood glucose trend is decreasing steadily. Her breakfast blood glucose range is generally 140-180 mg/dl. Her overall blood glucose range is 140-180 mg/dl. (She presents with significant improvement in her glycemic profile  , POC is 8.1 % improving from 9.8%. ) An ACE inhibitor/angiotensin II receptor blocker is not being taken.  Hypertension This is a chronic problem.  The problem is controlled. Pertinent negatives include no chest pain, headaches, palpitations or shortness of breath. Risk factors for coronary artery disease include dyslipidemia, diabetes mellitus, obesity, family history, smoking/tobacco exposure and sedentary lifestyle. Past treatments include calcium channel blockers.  Hyperlipidemia This is a chronic problem. The problem is uncontrolled. Exacerbating diseases include diabetes and obesity. Pertinent negatives include no chest pain, myalgias or shortness of breath. Risk factors for coronary artery disease include dyslipidemia, diabetes mellitus, hypertension, family history, obesity and a sedentary lifestyle.     Review of Systems  Constitutional:  Positive for fatigue. Negative for chills, fever and unexpected weight change.  HENT:  Negative for trouble swallowing and voice change.   Eyes:  Negative for visual disturbance.  Respiratory:  Negative for cough, shortness of breath and wheezing.   Cardiovascular:  Negative for chest pain, palpitations and leg swelling.  Gastrointestinal:  Negative for diarrhea, nausea and vomiting.  Endocrine: Negative for cold intolerance, heat intolerance, polydipsia, polyphagia and polyuria.  Musculoskeletal:   Negative for arthralgias and myalgias.  Skin:  Negative for color change, pallor, rash and wound.  Neurological:  Negative for seizures and headaches.  Psychiatric/Behavioral:  Negative for confusion and suicidal ideas.     Objective:       10/20/2022   10:31 AM 10/13/2022    4:40 PM 10/13/2022    3:53 PM  Vitals with BMI  Height _0     Weight 329 lbs    BMI 57.84    Systolic 696 295 284  Diastolic 87 95 94  Pulse 91  90    BP 126/87   Pulse 91   Ht _1  (1.575 m)   Wt (!) 329 lb (149.2 kg)   BMI 60.17 kg/m   Wt Readings from Last 3 Encounters:  10/20/22 (!) 329 lb (149.2 kg)  07/04/22 (!) 330 lb (149.7 kg)  03/23/22 (!) 340 lb 9.6 oz (154.5 kg)      CMP ( most recent) CMP     Component Value Date/Time   NA 139 10/18/2022 0818   K 4.2 10/18/2022 0818   CL 101 10/18/2022 0818   CO2 26 10/18/2022 0818   GLUCOSE 148 (H) 10/18/2022 0818   GLUCOSE 92 10/18/2018 0825   BUN 10 10/18/2022 0818   CREATININE 0.77 10/18/2022 0818   CREATININE 0.70 12/19/2017 0900   CALCIUM 9.1 10/18/2022 0818   PROT 7.1 10/18/2022 0818   ALBUMIN 3.9 (L) 10/18/2022 0818   AST 16 10/18/2022 0818   ALT 16 10/18/2022 0818   ALKPHOS 93 10/18/2022 0818   BILITOT 0.3 10/18/2022 0818   GFRNONAA >60 10/18/2018 0825   GFRNONAA 124 11/08/2017 1129   GFRAA >60 10/18/2018 0825   GFRAA 144 11/08/2017 1129     Diabetic Labs (most recent): Lab Results  Component Value Date   HGBA1C 7.7 (A) 10/20/2022   HGBA1C 8.1 (A) 07/04/2022   HGBA1C 9.8 (H) 01/30/2022   MICROALBUR 1.7 11/08/2017   MICROALBUR 0.7 10/23/2016     Lipid Panel ( most recent) Lipid Panel     Component Value Date/Time   CHOL 155 10/18/2022 0818   TRIG 97 10/18/2022 0818   HDL 29 (L) 10/18/2022 0818   CHOLHDL 5.3 (H) 10/18/2022 0818   CHOLHDL 6.5 (H) 11/08/2017 1129   VLDL 21 01/24/2017 1055   LDLCALC 108 (H) 10/18/2022 0818   LDLCALC 131 (H) 11/08/2017 1129   LABVLDL 18 10/18/2022 0818      Lab Results   Component Value Date  TSH 0.595 10/18/2022   TSH 0.566 01/30/2022   TSH 1.17 12/19/2017   TSH 0.63 10/23/2016   FREET4 1.53 10/18/2022   FREET4 1.7 12/19/2017       Assessment & Plan:   1. Type 2 diabetes mellitus without complication, without long-term current use of insulin ([facility])  - Selena Barr has currently uncontrolled symptomatic type 2 DM since  31 years of age.  She presents with significant improvement in her glycemic profile  , POC is 8.1 % improving from 9.8%.   Recent labs reviewed. - I had a long discussion with her about the progressive nature of diabetes and the pathology behind its complications. -her diabetes is complicated by morbid obesity, smoking, and she remains at a high risk for more acute and chronic complications which include CAD, CVA, CKD, retinopathy, and neuropathy. These are all discussed in detail with her.  - I discussed all available options of managing her diabetes including de-escalation of medications. I have counseled her on diet  and weight management  by adopting a Whole Food , Plant Predominant  ( WFPP) nutrition as recommended by [employer] of Lifestyle Medicine. Patient is encouraged to switch to  unprocessed or minimally processed  complex starch, adequate protein intake (mainly plant source), minimal liquid fat ( mainly vegetable oils), plenty of fruits, and vegetables. -  she is advised to stick to a routine mealtimes to eat 3 complete meals a day and snack only when necessary ( to snack only to correct hypoglycemia BG <70 day time or <100 at night).  -She is attempting to incorporate lifestyle changes.  she acknowledges that there is a room for improvement in her food and drink choices. - Suggestion is made for her to avoid simple carbohydrates  from her diet including Cakes, Sweet Desserts, Ice Cream, Soda (diet and regular), Sweet Tea, Candies, Chips, Cookies, Store Bought Juices, Alcohol , Artificial Sweeteners,  Coffee Creamer,  and "Sugar-free" Products, Lemonade. This will help patient to have more stable blood glucose profile and potentially avoid unintended weight gain.  The following Lifestyle Medicine recommendations according to [facility]) were discussed and and offered to patient and she  agrees to start the journey:  A. Whole Foods, Plant-Based Nutrition comprising of fruits and vegetables, plant-based proteins, whole-grain carbohydrates was discussed in detail with the patient.   A list for source of those nutrients were also provided to the patient.  Patient will use only water or unsweetened tea for hydration. B.  The need to stay away from risky substances including alcohol, smoking; obtaining 7 to 9 hours of restorative sleep, at least 150 minutes of moderate intensity exercise weekly, the importance of healthy social connections,  and stress management techniques were discussed. C.  A full color page of  Calorie density of various food groups per pound showing examples of each food groups was provided to the patient.   - she will be scheduled with Jearld Fenton, RDN, CDE for individualized diabetes education.  - I have approached her with the following plan to manage  her diabetes and patient agrees:  -In light of her presentation with controlled glycemia to target, she will not need insulin therapy for now.    -She is advised to continue her current medications including metformin 1000 mg p.o. twice daily, glipizide 5 mg XL p.o. daily at breakfast.  - she is advised to continue monitor blood glucose at least twice a day-before breakfast and at bedtime  and return in 9 weeks with her meter and logs.  - she is encouraged to call clinic for blood glucose levels less than 70 or above 200 mg /dl.  - she will be considered for incretin therapy as appropriate next visit.  - Specific targets for  A1c;  LDL, HDL,  and Triglycerides were discussed with the patient.  2) Blood  Pressure /Hypertension:  -Her blood pressure is controlled to target.  she is advised to continue her current medications including amlodipine 10 mg p.o. daily with breakfast .  Will be considered for ACE inhibitor or ARB on subsequent visits. 3) Lipids/Hyperlipidemia:   Review of her recent lipid panel showed uncontrolled  LDL at 131 .  she  is not on statins.  She will be considered for statin intervention on her next visit.  This is due to the fact that she is hesitant to escalate medications at the moment.  WF PB diet will help, she will need fasting lipid panel on subsequent visits.   4)  Weight/Diet:  Body mass index is 60.17 kg/m.  -   lost 44 lbs so fr , clearly complicating her diabetes care.   she is  a candidate for weight loss. I discussed with her the fact that loss of 5 - 10% of her  current body weight will have the most impact on her diabetes management.  The above detailed  ACLM recommendations for nutrition, exercise, sleep, social life, avoidance of risky substances, the need for restorative sleep   information will also detailed on discharge instructions.  5) Chronic Care/Health Maintenance:  -she  is not  on ACEI/ARB and Statin medications and  is encouraged to initiate and continue to follow up with Ophthalmology, Dentist,  Podiatrist at least yearly or according to recommendations, and advised to  quit smoking. I have recommended yearly flu vaccine and pneumonia vaccine at least every 5 years; moderate intensity exercise for up to 150 minutes weekly; and  sleep for 7- 9 hours a day.  - she is  advised to maintain close follow up with Pcp, No for primary care needs, as well as her other providers for optimal and coordinated care.     I spent 44 minutes in the care of the patient today including review of labs from [facility], Lipids, Thyroid Function, Hematology (current and previous including abstractions from other facilities); face-to-face time discussing  her blood glucose  readings/logs, discussing hypoglycemia and hyperglycemia episodes and symptoms, medications doses, her options of short and long term treatment based on the latest standards of care / guidelines;  discussion about incorporating lifestyle medicine;  and documenting the encounter. Risk reduction counseling performed per USPSTF guidelines to reduce obesity and cardiovascular risk factors.     Please refer to Patient Instructions for Blood Glucose Monitoring and Insulin/Medications Dosing Guide"  in media tab for additional information. Please  also refer to " Patient Self Inventory" in the Media  tab for reviewed elements of pertinent patient history.  Selena Barr participated in the discussions, expressed understanding, and voiced agreement with the above plans.  All questions were answered to her satisfaction. she is encouraged to contact clinic should she have any questions or concerns prior to her return visit.     Follow up plan: - Return in about 3 months (around 01/20/2023) for Bring Meter/CGM Device/Logs- A1c in Office.  Glade Lloyd, MD [facility] [address] [place], [place] [address] Phone: [phone]  Fax: [phone]  10/20/2022, 2:09 PM  This note was partially dictated with voice recognition software. Similar sounding words can be transcribed inadequately or may not  be corrected upon review.

## 2023-01-09 ENCOUNTER — Other Ambulatory Visit: Payer: Self-pay | Admitting: "Endocrinology

## 2023-01-15 ENCOUNTER — Encounter: Payer: Medicaid Other | Admitting: Surgical

## 2023-01-23 ENCOUNTER — Ambulatory Visit: Payer: Medicaid Other | Admitting: "Endocrinology

## 2023-02-02 ENCOUNTER — Other Ambulatory Visit: Payer: Self-pay

## 2023-02-02 ENCOUNTER — Ambulatory Visit
Admission: EM | Admit: 2023-02-02 | Discharge: 2023-02-02 | Disposition: A | Discharge status: Home / Self Care | Payer: Medicaid Other | Attending: Nurse Practitioner | Admitting: Nurse Practitioner

## 2023-02-02 ENCOUNTER — Encounter: Payer: Self-pay | Admitting: Emergency Medicine

## 2023-02-02 DIAGNOSIS — H00014 Hordeolum externum left upper eyelid: Secondary | ICD-10-CM | POA: Diagnosis not present

## 2023-02-02 MED ORDER — ERYTHROMYCIN 5 MG/GM OP OINT: 1.0000 | TOPICAL_OINTMENT | Freq: Every day | OPHTHALMIC | 0 refills | Status: AC

## 2023-02-02 NOTE — Discharge Instructions (Signed)
Apply medication as prescribed. Cool compresses to the eyes to help with pain or swelling.  May apply warm compresses for pain or discomfort. Recommend use of over-the-counter eyedrops such as Visine or Clear Eyes to help keep the eyes moist and decrease redness. Strict handwashing when applying medication.  Avoid rubbing or manipulating the eyes while symptoms persist. Discard any eye make-up that were using prior to your symptoms starting. You experience worsening swelling, foul-smelling drainage, or change in your vision, please follow-up in this clinic or with your primary care physician for further evaluation. Follow-up as needed.

## 2023-02-02 NOTE — ED Triage Notes (Addendum)
Pt reports left upper eye lid swelling since this am. Denies any injury.

## 2023-02-02 NOTE — ED Provider Notes (Signed)
[facility]    CSN: [id number] Arrival date & time: 02/02/23  1850      History   Chief Complaint Chief Complaint  Patient presents with   Eye Problem    HPI Selena Barr is a 32 y.o. female.   The history is provided by the patient.   The patient presents for complaints of pain and swelling to the left upper eyelid.  Symptoms started this morning upon awakening.  She did fever, chills, change in vision, loss of vision, eye drainage, light sensitivity, or headache.  Patient states that she does not wear contacts or glasses.  She is not taking any medication for her symptoms.   Past Medical History:  Diagnosis Date   Allergy    Diabetes mellitus without complication ([place])    Hyperlipidemia 10/24/2016   Hypertension    Tobacco abuse 09/22/2016    Patient Active Problem List   Diagnosis Date Noted   Nexplanon insertion 2022-02-03   General counseling and advice for contraceptive management 01/30/2022   Pregnancy test negative 01/30/2022   Screen for STD (sexually transmitted disease) 01/30/2022   Routine cervical smear 01/30/2022   Menorrhagia with irregular cycle 01/30/2022   History of gestational diabetes 01/30/2022   Essential hypertension, benign 06/11/2020   Postpartum hypertension 02-04-19   Death of infant Jul 24, 2018   Mixed hyperlipidemia 10/24/2016   Type 2 diabetes mellitus without complication, without long-term current use of insulin ([place]) 09/22/2016   Morbid obesity ([place]) 09/22/2016   Current smoker 09/22/2016    Past Surgical History:  Procedure Laterality Date   WISDOM TOOTH EXTRACTION      OB History     Gravida  3   Para  3   Term  3   Preterm      AB      Living  2      SAB      IAB      Ectopic      Multiple      Live Births  3            Home Medications    Prior to Admission medications   Medication Sig Start Date End Date Taking? Authorizing Provider  erythromycin ophthalmic ointment Place  1 Application into the left eye at bedtime for 7 days. 02/02/23 02/09/23 Yes Karrin Eisenmenger-Warren, Alda Lea, NP  albuterol (VENTOLIN HFA) 108 (90 Base) MCG/ACT inhaler Inhale 1 puff into the lungs every 6 (six) hours as needed for wheezing or shortness of breath. 10/13/22   Eulogio Bear, NP  amLODipine (NORVASC) 10 MG tablet Take 1 tablet (10 mg total) by mouth daily. 01/30/22   Estill Dooms, NP  Blood Glucose Monitoring Suppl (ACCU-CHEK GUIDE ME) w/Device KIT USE A DIRECTED 01/10/23   Nida, Marella Chimes, MD  Etonogestrel [facility]) Inject into the skin.    [provider]  glipiZIDE (GLUCOTROL XL) 5 MG 24 hr tablet Take 1 tablet (5 mg total) by mouth daily with breakfast. 02/20/22   Nida, Marella Chimes, MD  glucose blood (ACCU-CHEK GUIDE) test strip Use to monitor glucose 2 times a day 02/21/22   Cassandria Anger, MD  glucose blood test strip 1 each by Other route as needed. Use to test blood sugar twice daily as instructed    [provider]  Lancets MISC USE TO CHECK BLOOD GLUCOSE TWICE DAILY 02/21/22   Cassandria Anger, MD  metFORMIN (GLUCOPHAGE) 1000 MG tablet Take 1 tablet (1,000 mg total)  by mouth 2 (two) times daily with a meal. 07/04/22   Nida, Marella Chimes, MD  Semaglutide, 1 MG/DOSE, 4 MG/3ML SOPN Inject 1 mg as directed once a week. 10/20/22   Cassandria Anger, MD  hydrochlorothiazide (HYDRODIURIL) 25 MG tablet Take 1 tablet (25 mg total) by mouth daily. Patient not taking: Reported on 03/05/2019 02/06/19 05/06/20  Roma Schanz, CNM  norethindrone (MICRONOR,CAMILA,ERRIN) 0.35 MG tablet Take 1 tablet (0.35 mg total) by mouth daily. 02/03/19 03/06/20  Roma Schanz, CNM    Family History Family History  Problem Relation Age of Onset   Hypertension Mother    Diabetes Mother    Alcohol abuse Mother    COPD Mother    Drug abuse Mother    Cancer Maternal Grandmother        throat   Cancer Maternal Grandfather        throat   Diabetes  Maternal Grandfather     Social History Social History   Tobacco Use   Smoking status: Every Day    Packs/day: 0.25    Years: 2.00    Total pack years: 0.50    Types: Cigarettes   Smokeless tobacco: Never   Tobacco comments:    "5 per day"  Vaping Use   Vaping Use: Never used  Substance Use Topics   Alcohol use: No   Drug use: No    Comment: denies use 08/28/14     Allergies   Solu-medrol [methylprednisolone sodium succ]   Review of Systems Review of Systems Per HPI  Physical Exam Triage Vital Signs ED Triage Vitals  Enc Vitals Group     BP 02/02/23 1912 (!) 143/85     Pulse Rate 02/02/23 1912 86     Resp 02/02/23 1912 20     Temp 02/02/23 1912 98.2 F (36.8 C)     Temp Source 02/02/23 1912 Oral     SpO2 02/02/23 1912 97 %     Weight --      Height --      Head Circumference --      Peak Flow --      Pain Score 02/02/23 1909 7     Pain Loc --      Pain Edu? --      Excl. in [place]? --    No data found.  Updated Vital Signs BP (!) 143/85 (BP Location: Right Arm)   Pulse 86   Temp 98.2 F (36.8 C) (Oral)   Resp 20   SpO2 97%   Visual Acuity Right Eye Distance:   Left Eye Distance:   Bilateral Distance:    Right Eye Near:   Left Eye Near:    Bilateral Near:     Physical Exam Vitals and nursing note reviewed.  Constitutional:      General: She is not in acute distress.    Appearance: Normal appearance.  HENT:     Head: Normocephalic.  Eyes:     General: Vision grossly intact.        Left eye: Hordeolum present.No foreign body or discharge.     Extraocular Movements: Extraocular movements intact.     Conjunctiva/sclera: Conjunctivae normal.     Pupils: Pupils are equal, round, and reactive to light.  Cardiovascular:     Rate and Rhythm: Normal rate and regular rhythm.     Pulses: Normal pulses.     Heart sounds: Normal heart sounds.  Pulmonary:     Effort: Pulmonary effort is normal.  No respiratory distress.     Breath sounds: Normal  breath sounds. No stridor. No wheezing, rhonchi or rales.  Abdominal:     General: Bowel sounds are normal.     Palpations: Abdomen is soft.  Musculoskeletal:     Cervical back: Normal range of motion.  Skin:    General: Skin is warm and dry.  Neurological:     General: No focal deficit present.     Mental Status: She is alert and oriented to person, place, and time.  Psychiatric:        Mood and Affect: Mood normal.        Behavior: Behavior normal.      UC Treatments / Results  Labs (all labs ordered are listed, but only abnormal results are displayed) Labs Reviewed - No data to display  EKG   Radiology No results found.  Procedures Procedures (including critical care time)  Medications Ordered in UC Medications - No data to display  Initial Impression / Assessment and Plan / UC Course  I have reviewed the triage vital signs and the nursing notes.  Pertinent labs & imaging results that were available during my care of the patient were reviewed by me and considered in my medical decision making (see chart for details).  The patient is well-appearing, she is in no acute distress, vital signs are stable.  Patient with an external hordeolum to the left upper eyelid.  Will treat with erythromycin thymic ointment for the next 7 days.  Supportive care recommendations were provided to the patient to include warm or cool compresses, over-the-counter analgesics for pain or discomfort, and avoiding manipulation or rubbing the eyes while symptoms persist.  Patient was given strict follow-up precautions.  Patient verbalizes understanding.  All questions were answered.  Patient stable for discharge.  Note was provided for work.  Final Clinical Impressions(s) / UC Diagnoses   Final diagnoses:  Hordeolum externum of left upper eyelid     Discharge Instructions      Apply medication as prescribed. Cool compresses to the eyes to help with pain or swelling.  May apply warm  compresses for pain or discomfort. Recommend use of over-the-counter eyedrops such as Visine or Clear Eyes to help keep the eyes moist and decrease redness. Strict handwashing when applying medication.  Avoid rubbing or manipulating the eyes while symptoms persist. Discard any eye make-up that were using prior to your symptoms starting. You experience worsening swelling, foul-smelling drainage, or change in your vision, please follow-up in this clinic or with your primary care physician for further evaluation. Follow-up as needed.     ED Prescriptions     Medication Sig Dispense Auth. Provider   erythromycin ophthalmic ointment Place 1 Application into the left eye at bedtime for 7 days. 7 g Dally Oshel-Warren, Alda Lea, NP      PDMP not reviewed this encounter.   Tish Men, NP 02/02/23 1950

## 2023-04-10 ENCOUNTER — Telehealth: Payer: Self-pay | Admitting: "Endocrinology

## 2023-04-10 MED ORDER — ACCU-CHEK GUIDE ME W/DEVICE KIT: PACK | 0 refills | Status: DC

## 2023-04-10 NOTE — Telephone Encounter (Signed)
Rx Sent  

## 2023-04-10 NOTE — Telephone Encounter (Signed)
Can you call in a new meter for pt, She said Accu Chek. [facility] [employer]

## 2023-05-03 ENCOUNTER — Encounter: Payer: Self-pay | Admitting: "Endocrinology

## 2023-05-03 ENCOUNTER — Ambulatory Visit: Payer: Medicaid Other | Admitting: "Endocrinology

## 2023-05-03 VITALS — BP 136/88 | HR 88 | Ht 62.0 in | Wt 336.4 lb

## 2023-05-03 DIAGNOSIS — I10 Essential (primary) hypertension: Secondary | ICD-10-CM | POA: Diagnosis not present

## 2023-05-03 DIAGNOSIS — E119 Type 2 diabetes mellitus without complications: Secondary | ICD-10-CM

## 2023-05-03 DIAGNOSIS — Z7984 Long term (current) use of oral hypoglycemic drugs: Secondary | ICD-10-CM | POA: Diagnosis not present

## 2023-05-03 DIAGNOSIS — E782 Mixed hyperlipidemia: Secondary | ICD-10-CM

## 2023-05-03 DIAGNOSIS — F17200 Nicotine dependence, unspecified, uncomplicated: Secondary | ICD-10-CM | POA: Diagnosis not present

## 2023-05-03 LAB — POCT GLYCOSYLATED HEMOGLOBIN (HGB A1C): HbA1c, POC (controlled diabetic range): 7.6 % — AB (ref 0.0–7.0)

## 2023-05-03 NOTE — Progress Notes (Signed)
05/03/2023, 5:18 PM   Endocrinology follow-up note  Subjective:    Patient ID: Selena Barr, female    DOB: 09/16/1991.  Selena Barr is being seen in follow-up after she was seen in consultation  for management of currently uncontrolled symptomatic diabetes .   Past Medical History:  Diagnosis Date   Allergy    Diabetes mellitus without complication (HCC)    Hyperlipidemia 10/24/2016   Hypertension    Tobacco abuse 09/22/2016    Past Surgical History:  Procedure Laterality Date   WISDOM TOOTH EXTRACTION      Social History   Socioeconomic History   Marital status: Single    Spouse name: Not on file   Number of children: 1   Years of education: 12   Highest education level: Not on file  Occupational History    Comment: unemployed  Tobacco Use   Smoking status: Every Day    Packs/day: 0.25    Years: 2.00    Additional pack years: 0.00    Total pack years: 0.50    Types: Cigarettes   Smokeless tobacco: Never   Tobacco comments:    "5 per day"  Vaping Use   Vaping Use: Never used  Substance and Sexual Activity   Alcohol use: No   Drug use: No    Comment: denies use 08/28/14   Sexual activity: Yes    Birth control/protection: Implant  Other Topics Concern   Not on file  Social History Narrative   Lives with mother and brother, sister, daughter   Social Determinants of Health   Financial Resource Strain: Medium Risk (08/15/2018)   Overall Financial Resource Strain (CARDIA)    Difficulty of Paying Living Expenses: Somewhat hard  Food Insecurity: No Food Insecurity (Aug 15, 2018)   Hunger Vital Sign    Worried About Running Out of Food in the Last Year: Never true    Ran Out of Food in the Last Year: Never true  Transportation Needs: No Transportation Needs (Aug 15, 2018)   PRAPARE - Administrator, Civil Service (Medical): No    Lack of Transportation  (Non-Medical): No  Physical Activity: Sufficiently Active (08-15-2018)   Exercise Vital Sign    Days of Exercise per Week: 5 days    Minutes of Exercise per Session: 150+ min  Stress: No Stress Concern Present (08/15/18)   [employer] of Occupational Health - Occupational Stress Questionnaire    Feeling of Stress : Not at all  Social Connections: Moderately Integrated (August 15, 2018)   Social Connection and Isolation Panel [NHANES]    Frequency of Communication with Friends and Family: More than three times a week    Frequency of Social Gatherings with Friends and Family: More than three times a week    Attends Religious Services: 1 to 4 times per year    Active Member of [employer] or Organizations: No    Attends Banker Meetings: Never    Marital Status: Living with partner    Family History  Problem Relation Age of Onset   Hypertension Mother    Diabetes Mother    Alcohol abuse Mother  COPD Mother    Drug abuse Mother    Cancer Maternal Grandmother        throat   Cancer Maternal Grandfather        throat   Diabetes Maternal Grandfather     Outpatient Encounter Medications as of 05/03/2023  Medication Sig   albuterol (VENTOLIN HFA) 108 (90 Base) MCG/ACT inhaler Inhale 1 puff into the lungs every 6 (six) hours as needed for wheezing or shortness of breath.   amLODipine (NORVASC) 10 MG tablet Take 1 tablet (10 mg total) by mouth daily. (Patient not taking: Reported on 05/03/2023)   Blood Glucose Monitoring Suppl (ACCU-CHEK GUIDE ME) w/Device KIT Use to test BG 2 times per day. Dx E11.65   Etonogestrel (NEXPLANON [place]) Inject into the skin.   glipiZIDE (GLUCOTROL XL) 5 MG 24 hr tablet Take 1 tablet (5 mg total) by mouth daily with breakfast.   glucose blood (ACCU-CHEK GUIDE) test strip Use to monitor glucose 2 times a day   glucose blood test strip 1 each by Other route as needed. Use to test blood sugar twice daily as instructed   Lancets MISC USE TO CHECK BLOOD  GLUCOSE TWICE DAILY   metFORMIN (GLUCOPHAGE) 1000 MG tablet Take 1 tablet (1,000 mg total) by mouth 2 (two) times daily with a meal.   [DISCONTINUED] hydrochlorothiazide (HYDRODIURIL) 25 MG tablet Take 1 tablet (25 mg total) by mouth daily. (Patient not taking: Reported on 03/05/2019)   [DISCONTINUED] norethindrone (MICRONOR,CAMILA,ERRIN) 0.35 MG tablet Take 1 tablet (0.35 mg total) by mouth daily.   [DISCONTINUED] Semaglutide, 1 MG/DOSE, 4 MG/3ML SOPN Inject 1 mg as directed once a week. (Patient not taking: Reported on 05/03/2023)   No facility-administered encounter medications on file as of 05/03/2023.    ALLERGIES: Allergies  Allergen Reactions   Solu-Medrol [Methylprednisolone Sodium Succ]     Rash, lip swelling     VACCINATION STATUS: Immunization History  Administered Date(s) Administered   HPV Quadrivalent 08/18/2008   Hepatitis A 11/17/2008   Hepatitis B 12/14/1997, 08/29/2004, 02/27/2005   Influenza Whole 09/22/2016   Influenza,inj,Quad PF,6+ Mos 11/08/2017, 08/19/2018   Influenza-Unspecified 12/06/2012   MMR 01/07/2019   Meningococcal Conjugate 08/18/2008   Pneumococcal Conjugate-13 11/08/2017   Pneumococcal Polysaccharide-23 01/07/2019   Tdap 09/16/2015, 11/25/2018    Diabetes She presents for her follow-up diabetic visit. She has type 2 diabetes mellitus. Onset time: Patient was diagnosed with type 2 diabetes at approximate age of 17.  She was seen prior to 2019 for the same diagnosis, however did not return for follow-up appointment. Her disease course has been stable. There are no hypoglycemic associated symptoms. Pertinent negatives for hypoglycemia include no confusion, headaches, pallor or seizures. Associated symptoms include fatigue. Pertinent negatives for diabetes include no chest pain, no polydipsia, no polyphagia and no polyuria. There are no hypoglycemic complications. Symptoms are stable. (Morbid obesity, chronic heavy smoking. ) Risk factors for coronary  artery disease include dyslipidemia, diabetes mellitus, obesity, tobacco exposure, sedentary lifestyle, hypertension and family history. Current diabetic treatments: She is currently on metformin 1000 mg p.o. twice daily, glipizide 5 p.o. daily once a day.  She did not call with Ozempic which she is stopped. Her weight is decreasing steadily (she lost significant amount of weight , from 374 to 329). She is following a generally unhealthy diet. When asked about meal planning, she reported none. Her home blood glucose trend is decreasing steadily. Her breakfast blood glucose range is generally 140-180 mg/dl. Her overall blood glucose range is  140-180 mg/dl. (She presents with her meter showing average blood glucose of 166 for the last 30 days.  Her point-of-care A1c is 7.6%, overall improving.  She does not have hypoglycemia.  She did not tolerate Ozempic which she stopped.   ) An ACE inhibitor/angiotensin II receptor blocker is not being taken.  Hypertension This is a chronic problem. The problem is controlled. Pertinent negatives include no chest pain, headaches, palpitations or shortness of breath. Risk factors for coronary artery disease include dyslipidemia, diabetes mellitus, obesity, family history, smoking/tobacco exposure and sedentary lifestyle. Past treatments include calcium channel blockers.  Hyperlipidemia This is a chronic problem. The problem is uncontrolled. Exacerbating diseases include diabetes and obesity. Pertinent negatives include no chest pain, myalgias or shortness of breath. Risk factors for coronary artery disease include dyslipidemia, diabetes mellitus, hypertension, family history, obesity and a sedentary lifestyle.     Objective:       05/03/2023    4:18 PM 05/03/2023    4:03 PM 02/02/2023    7:12 PM  Vitals with BMI  Height  5\' 2"    Weight  336 lbs 6 oz   BMI  61.51   Systolic 136 144 161  Diastolic 88 86 85  Pulse  88 86    BP 136/88 Comment: R arm with manuel cuff   Pulse 88   Ht 5\' 2"  (1.575 m)   Wt (!) 336 lb 6.4 oz (152.6 kg)   BMI 61.53 kg/m   Wt Readings from Last 3 Encounters:  05/03/23 (!) 336 lb 6.4 oz (152.6 kg)  10/20/22 (!) 329 lb (149.2 kg)  07/04/22 (!) 330 lb (149.7 kg)      CMP ( most recent) CMP     Component Value Date/Time   NA 139 10/18/2022 0818   K 4.2 10/18/2022 0818   CL 101 10/18/2022 0818   CO2 26 10/18/2022 0818   GLUCOSE 148 (H) 10/18/2022 0818   GLUCOSE 92 10/18/2018 0825   BUN 10 10/18/2022 0818   CREATININE 0.77 10/18/2022 0818   CREATININE 0.70 12/19/2017 0900   CALCIUM 9.1 10/18/2022 0818   PROT 7.1 10/18/2022 0818   ALBUMIN 3.9 (L) 10/18/2022 0818   AST 16 10/18/2022 0818   ALT 16 10/18/2022 0818   ALKPHOS 93 10/18/2022 0818   BILITOT 0.3 10/18/2022 0818   GFRNONAA >60 10/18/2018 0825   GFRNONAA 124 11/08/2017 1129   GFRAA >60 10/18/2018 0825   GFRAA 144 11/08/2017 1129     Diabetic Labs (most recent): Lab Results  Component Value Date   HGBA1C 7.6 (A) 05/03/2023   HGBA1C 7.7 (A) 10/20/2022   HGBA1C 8.1 (A) 07/04/2022   MICROALBUR 1.7 11/08/2017   MICROALBUR 0.7 10/23/2016     Lipid Panel ( most recent) Lipid Panel     Component Value Date/Time   CHOL 155 10/18/2022 0818   TRIG 97 10/18/2022 0818   HDL 29 (L) 10/18/2022 0818   CHOLHDL 5.3 (H) 10/18/2022 0818   CHOLHDL 6.5 (H) 11/08/2017 1129   VLDL 21 01/24/2017 1055   LDLCALC 108 (H) 10/18/2022 0818   LDLCALC 131 (H) 11/08/2017 1129   LABVLDL 18 10/18/2022 0818      Lab Results  Component Value Date   TSH 0.595 10/18/2022   TSH 0.566 01/30/2022   TSH 1.17 12/19/2017   TSH 0.63 10/23/2016   FREET4 1.53 10/18/2022   FREET4 1.7 12/19/2017       Assessment & Plan:   1. Type 2 diabetes mellitus without complication, without  long-term current use of insulin (HCC)  - Kashonda C Hulsey has currently uncontrolled symptomatic type 2 DM since  32 years of age.  She presents with her meter showing average blood glucose of  166 for the last 30 days.  Her point-of-care A1c is 7.6%, overall improving.  She does not have hypoglycemia.  She did not tolerate Ozempic which she stopped.   Recent labs reviewed. - I had a long discussion with her about the progressive nature of diabetes and the pathology behind its complications. -her diabetes is complicated by morbid obesity, smoking, and she remains at a high risk for more acute and chronic complications which include CAD, CVA, CKD, retinopathy, and neuropathy. These are all discussed in detail with her.  - I discussed all available options of managing her diabetes including de-escalation of medications. I have counseled her on diet  and weight management  by adopting a Whole Food , Plant Predominant  ( WFPP) nutrition as recommended by [employer] of Lifestyle Medicine. Patient is encouraged to switch to  unprocessed or minimally processed  complex starch, adequate protein intake (mainly plant source), minimal liquid fat ( mainly vegetable oils), plenty of fruits, and vegetables. -  she is advised to stick to a routine mealtimes to eat 3 complete meals a day and snack only when necessary ( to snack only to correct hypoglycemia BG <70 day time or <100 at night).   She has tried and  incorporated lifestyle medicine to her life.  She is achieving significant improvement. - she acknowledges that there is a room for improvement in her food and drink choices. - Suggestion is made for her to avoid simple carbohydrates  from her diet including Cakes, Sweet Desserts, Ice Cream, Soda (diet and regular), Sweet Tea, Candies, Chips, Cookies, Store Bought Juices, Alcohol , Artificial Sweeteners,  Coffee Creamer, and "Sugar-free" Products, Lemonade. This will help patient to have more stable blood glucose profile and potentially avoid unintended weight gain.  The following Lifestyle Medicine recommendations according to American College of Lifestyle Medicine  [facility]) were discussed and  and offered to patient and she  agrees to start the journey:  A. Whole Foods, Plant-Based Nutrition comprising of fruits and vegetables, plant-based proteins, whole-grain carbohydrates was discussed in detail with the patient.   A list for source of those nutrients were also provided to the patient.  Patient will use only water or unsweetened tea for hydration. B.  The need to stay away from risky substances including alcohol, smoking; obtaining 7 to 9 hours of restorative sleep, at least 150 minutes of moderate intensity exercise weekly, the importance of healthy social connections,  and stress management techniques were discussed. C.  A full color page of  Calorie density of various food groups per pound showing examples of each food groups was provided to the patient.   - I have approached her with the following plan to manage  her diabetes and patient agrees:  -In light of her presentation with controlled glycemia to target, she will not need insulin treatment for now.      -She is advised to continue her current regimen involving metformin 1000 mg p.o. twice daily, glipizide 5 mg XL p.o. daily at breakfast.    - she is advised to continue monitor blood glucose at least twice a day-before breakfast and at bedtime and return in 9 weeks with her meter and logs.  - she is encouraged to call clinic for blood glucose levels less than 70  or above 200 mg /dl. Ozempic caused GI side effects to her and she stopped it. - Specific targets for  A1c;  LDL, HDL,  and Triglycerides were discussed with the patient.  2) Blood Pressure /Hypertension:  -Her blood pressure is controlled to target.  she is advised to continue her current medications including amlodipine 10 mg p.o. daily with breakfast .    3) Lipids/Hyperlipidemia:   Review of her recent lipid panel showed improved LDL to 108 from 131.  She is not on statins.  This is due to the fact that she is hesitant to escalate medications at the moment.   She wishes to avoid statins for now.  Whole food plant-based diet will help her manage dyslipidemia.      4) morbid obesity:  Body mass index is 61.53 kg/m.  -   She is stabilizing after she lost 45 lbs so fr , clearly complicating her diabetes care.   she is  a candidate for weight loss. I discussed with her the fact that loss of 5 - 10% of her  current body weight will have the most impact on her diabetes management.  The above detailed  ACLM recommendations for nutrition, exercise, sleep, social life, avoidance of risky substances, the need for restorative sleep   information will also detailed on discharge instructions.  5) Chronic Care/Health Maintenance:  -she  is not  on ACEI/ARB and Statin medications and  is encouraged to initiate and continue to follow up with Ophthalmology, Dentist,  Podiatrist at least yearly or according to recommendations, and advised to  quit smoking. I have recommended yearly flu vaccine and pneumonia vaccine at least every 5 years; moderate intensity exercise for up to 150 minutes weekly; and  sleep for 7- 9 hours a day.  The patient was counseled on the dangers of tobacco use, and was advised to quit.  Reviewed strategies to maximize success, including removing cigarettes and smoking materials from environment.   - she is  advised to maintain close follow up with Pcp, No for primary care needs, as well as her other providers for optimal and coordinated care.    I spent  26  minutes in the care of the patient today including review of labs from CMP, Lipids, Thyroid Function, Hematology (current and previous including abstractions from other facilities); face-to-face time discussing  her blood glucose readings/logs, discussing hypoglycemia and hyperglycemia episodes and symptoms, medications doses, her options of short and long term treatment based on the latest standards of care / guidelines;  discussion about incorporating lifestyle medicine;  and documenting the  encounter. Risk reduction counseling performed per USPSTF guidelines to reduce  obesity and cardiovascular risk factors.     Please refer to Patient Instructions for Blood Glucose Monitoring and Insulin/Medications Dosing Guide"  in media tab for additional information. Please  also refer to " Patient Self Inventory" in the Media  tab for reviewed elements of pertinent patient history.  Jassmine C Romo participated in the discussions, expressed understanding, and voiced agreement with the above plans.  All questions were answered to her satisfaction. she is encouraged to contact clinic should she have any questions or concerns prior to her return visit.     Follow up plan: - Return in about 4 months (around 09/03/2023) for F/U with Pre-visit Labs, Meter/CGM/Logs, A1c here.  Marquis Lunch, MD [facility] [address] [place], [place] [address] Phone: [phone]  Fax: [phone]    05/03/2023, 5:18  PM  This note was partially dictated with voice recognition software. Similar sounding words can be transcribed inadequately or may not  be corrected upon review.

## 2023-05-03 NOTE — Patient Instructions (Signed)

## 2023-07-08 ENCOUNTER — Emergency Department (HOSPITAL_COMMUNITY)
Admission: EM | Admit: 2023-07-08 | Discharge: 2023-07-08 | Disposition: A | Discharge status: Home / Self Care | Payer: Medicaid Other | Attending: Emergency Medicine | Admitting: Emergency Medicine

## 2023-07-08 ENCOUNTER — Other Ambulatory Visit: Payer: Self-pay

## 2023-07-08 ENCOUNTER — Encounter (HOSPITAL_COMMUNITY): Payer: Self-pay | Admitting: Emergency Medicine

## 2023-07-08 DIAGNOSIS — I10 Essential (primary) hypertension: Secondary | ICD-10-CM | POA: Insufficient documentation

## 2023-07-08 DIAGNOSIS — R519 Headache, unspecified: Secondary | ICD-10-CM

## 2023-07-08 DIAGNOSIS — E119 Type 2 diabetes mellitus without complications: Secondary | ICD-10-CM | POA: Insufficient documentation

## 2023-07-08 DIAGNOSIS — Z79899 Other long term (current) drug therapy: Secondary | ICD-10-CM | POA: Insufficient documentation

## 2023-07-08 DIAGNOSIS — Z794 Long term (current) use of insulin: Secondary | ICD-10-CM | POA: Insufficient documentation

## 2023-07-08 DIAGNOSIS — Z7984 Long term (current) use of oral hypoglycemic drugs: Secondary | ICD-10-CM | POA: Insufficient documentation

## 2023-07-08 LAB — CBC WITH DIFFERENTIAL/PLATELET
Abs Immature Granulocytes: 0.04 10*3/uL (ref 0.00–0.07)
Basophils Absolute: 0.1 10*3/uL (ref 0.0–0.1)
Basophils Relative: 1 %
Eosinophils Absolute: 0.2 10*3/uL (ref 0.0–0.5)
Eosinophils Relative: 1 %
HCT: 44 % (ref 36.0–46.0)
Hemoglobin: 14.7 g/dL (ref 12.0–15.0)
Immature Granulocytes: 0 %
Lymphocytes Relative: 14 %
Lymphs Abs: 1.8 10*3/uL (ref 0.7–4.0)
MCH: 28.6 pg (ref 26.0–34.0)
MCHC: 33.4 g/dL (ref 30.0–36.0)
MCV: 85.6 fL (ref 80.0–100.0)
Monocytes Absolute: 0.6 10*3/uL (ref 0.1–1.0)
Monocytes Relative: 5 %
Neutro Abs: 9.8 10*3/uL — ABNORMAL HIGH (ref 1.7–7.7)
Neutrophils Relative %: 79 %
Platelets: 411 10*3/uL — ABNORMAL HIGH (ref 150–400)
RBC: 5.14 MIL/uL — ABNORMAL HIGH (ref 3.87–5.11)
RDW: 14.1 % (ref 11.5–15.5)
WBC: 12.3 10*3/uL — ABNORMAL HIGH (ref 4.0–10.5)
nRBC: 0 % (ref 0.0–0.2)

## 2023-07-08 LAB — BASIC METABOLIC PANEL
Anion gap: 8 (ref 5–15)
BUN: 13 mg/dL (ref 6–20)
CO2: 26 mmol/L (ref 22–32)
Calcium: 8.5 mg/dL — ABNORMAL LOW (ref 8.9–10.3)
Chloride: 102 mmol/L (ref 98–111)
Creatinine, Ser: 0.77 mg/dL (ref 0.44–1.00)
GFR, Estimated: >60 mL/min (ref >60)
Glucose, Bld: 238 mg/dL — ABNORMAL HIGH (ref 70–99)
Potassium: 3.7 mmol/L (ref 3.5–5.1)
Sodium: 136 mmol/L (ref 135–145)

## 2023-07-08 LAB — CBG MONITORING, ED: Glucose-Capillary: 217 mg/dL — ABNORMAL HIGH (ref 70–99)

## 2023-07-08 MED ORDER — METOPROLOL TARTRATE 5 MG/5ML IV SOLN
5.0000 mg | Freq: Once | INTRAVENOUS | Status: AC
  Administered 2023-07-08: 5 mg via INTRAVENOUS
  Filled 2023-07-08: qty 5

## 2023-07-08 MED ORDER — METFORMIN HCL 1000 MG PO TABS: 1000.0000 mg | ORAL_TABLET | Freq: Every day | ORAL | 2 refills | Status: DC

## 2023-07-08 MED ORDER — AMLODIPINE BESYLATE 5 MG PO TABS: 5.0000 mg | ORAL_TABLET | Freq: Every day | ORAL | 2 refills | Status: DC

## 2023-07-08 MED ORDER — KETOROLAC TROMETHAMINE 30 MG/ML IJ SOLN
30.0000 mg | Freq: Once | INTRAMUSCULAR | Status: AC
  Administered 2023-07-08: 30 mg via INTRAVENOUS
  Filled 2023-07-08: qty 1

## 2023-07-08 NOTE — Discharge Instructions (Signed)
Follow-up with a family doctor in the next few weeks

## 2023-07-08 NOTE — ED Provider Notes (Signed)
[facility] AT [facility] Provider Note   CSN: [id number] Arrival date & time: 07/08/23  1843     History {Add pertinent medical, surgical, social history, OB history to HPI:1} Chief Complaint  Patient presents with   Migraine    Selena Barr is a 32 y.o. female.  Patient presents with a headache.  She has a history of hypertension and diabetes but is not on her medicines now.   Migraine       Home Medications Prior to Admission medications   Medication Sig Start Date End Date Taking? Authorizing Provider  amLODipine (NORVASC) 5 MG tablet Take 1 tablet (5 mg total) by mouth daily. 07/08/23  Yes Bethann Berkshire, MD  metFORMIN (GLUCOPHAGE) 1000 MG tablet Take 1 tablet (1,000 mg total) by mouth daily with breakfast. 07/08/23  Yes Bethann Berkshire, MD  albuterol (VENTOLIN HFA) 108 (90 Base) MCG/ACT inhaler Inhale 1 puff into the lungs every 6 (six) hours as needed for wheezing or shortness of breath. 10/13/22   Valentino Nose, NP  amLODipine (NORVASC) 10 MG tablet Take 1 tablet (10 mg total) by mouth daily. Patient not taking: Reported on 05/03/2023 01/30/22   Adline Potter, NP  Etonogestrel [facility]) Inject into the skin.    [provider]  glipiZIDE (GLUCOTROL XL) 5 MG 24 hr tablet Take 1 tablet (5 mg total) by mouth daily with breakfast. 02/20/22   Nida, Denman George, MD  glucose blood (ACCU-CHEK GUIDE) test strip Use to monitor glucose 2 times a day 02/21/22   Roma Kayser, MD  glucose blood test strip 1 each by Other route as needed. Use to test blood sugar twice daily as instructed    [provider]  Lancets MISC USE TO CHECK BLOOD GLUCOSE TWICE DAILY 02/21/22   Nida, Denman George, MD  hydrochlorothiazide (HYDRODIURIL) 25 MG tablet Take 1 tablet (25 mg total) by mouth daily. Patient not taking: Reported on 03/05/2019 02/06/19 05/06/20  Cheral Marker, CNM  norethindrone (MICRONOR,CAMILA,ERRIN) 0.35 MG  tablet Take 1 tablet (0.35 mg total) by mouth daily. 02/03/19 03/06/20  Cheral Marker, CNM      Allergies    Solu-medrol [methylprednisolone sodium succ]    Review of Systems   Review of Systems  Physical Exam Updated Vital Signs BP (!) 130/91   Pulse 89   Temp 98.2 F (36.8 C) (Oral)   Resp 20   Ht 5\' 2"  (1.575 m)   Wt (!) 149.7 kg   SpO2 100%   BMI 60.36 kg/m  Physical Exam  ED Results / Procedures / Treatments   Labs (all labs ordered are listed, but only abnormal results are displayed) Labs Reviewed  CBC WITH DIFFERENTIAL/PLATELET - Abnormal; Notable for the following components:      Result Value   WBC 12.3 (*)    RBC 5.14 (*)    Platelets 411 (*)    Neutro Abs 9.8 (*)    All other components within normal limits  BASIC METABOLIC PANEL - Abnormal; Notable for the following components:   Glucose, Bld 238 (*)    Calcium 8.5 (*)    All other components within normal limits  CBG MONITORING, ED - Abnormal; Notable for the following components:   Glucose-Capillary 217 (*)    All other components within normal limits    EKG None  Radiology No results found.  Procedures Procedures  {Document cardiac monitor, telemetry assessment procedure when appropriate:1}  Medications Ordered in ED  Medications  ketorolac (TORADOL) 30 MG/ML injection 30 mg (30 mg Intravenous Given 07/08/23 2206)  metoprolol tartrate (LOPRESSOR) injection 5 mg (5 mg Intravenous Given 07/08/23 2206)    ED Course/ Medical Decision Making/ A&P  Patient's headache improved with Lopressor and Toradol.  She is prescribed Norvasc and Glucophage and will follow-up with her family doctor {   Click here for ABCD2, HEART and other calculatorsREFRESH Note before signing :1}                              Medical Decision Making Amount and/or Complexity of Data Reviewed Labs: ordered.  Risk Prescription drug management.   Headache, poorly controlled hypertension and diabetes.  She is placed back on  her Norvasc and Glucophage  {Document critical care time when appropriate:1} {Document review of labs and clinical decision tools ie heart score, Chads2Vasc2 etc:1}  {Document your independent review of radiology images, and any outside records:1} {Document your discussion with family members, caretakers, and with consultants:1} {Document social determinants of health affecting pt's care:1} {Document your decision making why or why not admission, treatments were needed:1} Final Clinical Impression(s) / ED Diagnoses Final diagnoses:  Primary hypertension  Bad headache    Rx / DC Orders ED Discharge Orders          Ordered    metFORMIN (GLUCOPHAGE) 1000 MG tablet  Daily with breakfast        07/08/23 2316    amLODipine (NORVASC) 5 MG tablet  Daily        07/08/23 2316

## 2023-07-08 NOTE — ED Triage Notes (Signed)
Pt with c/o headache x 3 days which is unrelieved by tylenol and Motrin. Last Tylenol @ 1pm.Pt also c/o nausea.

## 2023-08-28 ENCOUNTER — Emergency Department (HOSPITAL_COMMUNITY): Admission: EM | Admit: 2023-08-28 | Discharge: 2023-08-28 | Discharge status: Against Medical Advice | Payer: Self-pay

## 2023-08-30 ENCOUNTER — Other Ambulatory Visit: Payer: Self-pay

## 2023-08-30 ENCOUNTER — Encounter (HOSPITAL_COMMUNITY): Payer: Self-pay | Admitting: Emergency Medicine

## 2023-08-30 ENCOUNTER — Emergency Department (HOSPITAL_COMMUNITY)
Admission: EM | Admit: 2023-08-30 | Discharge: 2023-08-31 | Disposition: A | Discharge status: Home / Self Care | Payer: Self-pay | Attending: Emergency Medicine | Admitting: Emergency Medicine

## 2023-08-30 DIAGNOSIS — E119 Type 2 diabetes mellitus without complications: Secondary | ICD-10-CM | POA: Insufficient documentation

## 2023-08-30 DIAGNOSIS — Z7984 Long term (current) use of oral hypoglycemic drugs: Secondary | ICD-10-CM | POA: Insufficient documentation

## 2023-08-30 DIAGNOSIS — N200 Calculus of kidney: Secondary | ICD-10-CM | POA: Insufficient documentation

## 2023-08-30 DIAGNOSIS — N3000 Acute cystitis without hematuria: Secondary | ICD-10-CM

## 2023-08-30 LAB — CBC
HCT: 40.5 % (ref 36.0–46.0)
Hemoglobin: 13.7 g/dL (ref 12.0–15.0)
MCH: 29 pg (ref 26.0–34.0)
MCHC: 33.8 g/dL (ref 30.0–36.0)
MCV: 85.6 fL (ref 80.0–100.0)
Platelets: 368 10*3/uL (ref 150–400)
RBC: 4.73 MIL/uL (ref 3.87–5.11)
RDW: 14 % (ref 11.5–15.5)
WBC: 16.2 10*3/uL — ABNORMAL HIGH (ref 4.0–10.5)
nRBC: 0 % (ref 0.0–0.2)

## 2023-08-30 LAB — URINALYSIS, ROUTINE W REFLEX MICROSCOPIC
Bilirubin Urine: NEGATIVE
Glucose, UA: NEGATIVE mg/dL
Ketones, ur: NEGATIVE mg/dL
Nitrite: NEGATIVE
Protein, ur: 30 mg/dL — AB
Specific Gravity, Urine: 1.024 (ref 1.005–1.030)
WBC, UA: >50 WBC/hpf (ref 0–5)
pH: 5 (ref 5.0–8.0)

## 2023-08-30 LAB — BASIC METABOLIC PANEL
Anion gap: 9 (ref 5–15)
BUN: 8 mg/dL (ref 6–20)
CO2: 25 mmol/L (ref 22–32)
Calcium: 8.2 mg/dL — ABNORMAL LOW (ref 8.9–10.3)
Chloride: 101 mmol/L (ref 98–111)
Creatinine, Ser: 0.87 mg/dL (ref 0.44–1.00)
GFR, Estimated: >60 mL/min (ref >60)
Glucose, Bld: 249 mg/dL — ABNORMAL HIGH (ref 70–99)
Potassium: 3.5 mmol/L (ref 3.5–5.1)
Sodium: 135 mmol/L (ref 135–145)

## 2023-08-30 LAB — PREGNANCY, URINE: Preg Test, Ur: NEGATIVE

## 2023-08-30 NOTE — ED Triage Notes (Signed)
Pt c/o right sided lower back pain that radiates to lower right abd with fever since yesterday, pt c/o urinary urgency

## 2023-08-31 ENCOUNTER — Emergency Department (HOSPITAL_COMMUNITY): Payer: Self-pay

## 2023-08-31 MED ORDER — CEPHALEXIN 500 MG PO CAPS: 500.0000 mg | ORAL_CAPSULE | Freq: Three times a day (TID) | ORAL | 0 refills | Status: DC

## 2023-08-31 MED ORDER — CEPHALEXIN 500 MG PO CAPS
500.0000 mg | ORAL_CAPSULE | Freq: Once | ORAL | Status: AC
  Administered 2023-08-31: 500 mg via ORAL
  Filled 2023-08-31: qty 1

## 2023-08-31 NOTE — ED Provider Notes (Signed)
[place] EMERGENCY DEPARTMENT AT [facility] Provider Note   CSN: [id number] Arrival date & time: 08/30/23  2108     History  Chief Complaint  Patient presents with   Abdominal Pain    Selena Barr is a 32 y.o. female.  Patient is a 32 year old female with history of type 2 diabetes.  Patient presenting today for evaluation of right flank and abdominal pain.  This started yesterday and worsened today.  She describes running a fever at home to 101.  She denies any dysuria, but does have an urge to urinate after she drinks.  No bowel complaints.  Pain is worse with movement and palpation no alleviating factors.  The history is provided by the patient.       Home Medications Prior to Admission medications   Medication Sig Start Date End Date Taking? Authorizing Provider  albuterol (VENTOLIN HFA) 108 (90 Base) MCG/ACT inhaler Inhale 1 puff into the lungs every 6 (six) hours as needed for wheezing or shortness of breath. 10/13/22   Valentino Nose, NP  amLODipine (NORVASC) 10 MG tablet Take 1 tablet (10 mg total) by mouth daily. Patient not taking: Reported on 05/03/2023 01/30/22   Cyril Mourning A, NP  amLODipine (NORVASC) 5 MG tablet Take 1 tablet (5 mg total) by mouth daily. 07/08/23   Bethann Berkshire, MD  Etonogestrel [facility]) Inject into the skin.    [provider]  glipiZIDE (GLUCOTROL XL) 5 MG 24 hr tablet Take 1 tablet (5 mg total) by mouth daily with breakfast. 02/20/22   Nida, Denman George, MD  glucose blood (ACCU-CHEK GUIDE) test strip Use to monitor glucose 2 times a day 02/21/22   Roma Kayser, MD  glucose blood test strip 1 each by Other route as needed. Use to test blood sugar twice daily as instructed    [provider]  Lancets MISC USE TO CHECK BLOOD GLUCOSE TWICE DAILY 02/21/22   Roma Kayser, MD  metFORMIN (GLUCOPHAGE) 1000 MG tablet Take 1 tablet (1,000 mg total) by mouth daily with breakfast. 07/08/23    Bethann Berkshire, MD  hydrochlorothiazide (HYDRODIURIL) 25 MG tablet Take 1 tablet (25 mg total) by mouth daily. Patient not taking: Reported on 03/05/2019 02/06/19 05/06/20  Cheral Marker, CNM  norethindrone (MICRONOR,CAMILA,ERRIN) 0.35 MG tablet Take 1 tablet (0.35 mg total) by mouth daily. 02/03/19 03/06/20  Cheral Marker, CNM      Allergies    Solu-medrol [methylprednisolone sodium succ]    Review of Systems   Review of Systems  All other systems reviewed and are negative.   Physical Exam Updated Vital Signs BP 123/82 (BP Location: Right Arm)   Pulse (!) 103   Temp 99.1 F (37.3 C) (Oral)   Resp 16   Ht 5\' 2"  (1.575 m)   Wt (!) 154.2 kg   SpO2 99%   BMI 62.19 kg/m  Physical Exam Vitals and nursing note reviewed.  Constitutional:      General: She is not in acute distress.    Appearance: She is well-developed. She is not diaphoretic.  HENT:     Head: Normocephalic and atraumatic.  Cardiovascular:     Rate and Rhythm: Normal rate and regular rhythm.     Heart sounds: No murmur heard.    No friction rub. No gallop.  Pulmonary:     Effort: Pulmonary effort is normal. No respiratory distress.     Breath sounds: Normal breath sounds. No wheezing.  Abdominal:  General: Bowel sounds are normal. There is no distension.     Palpations: Abdomen is soft.     Tenderness: There is abdominal tenderness in the right lower quadrant. There is right CVA tenderness. There is no left CVA tenderness, guarding or rebound.  Musculoskeletal:        General: Normal range of motion.     Cervical back: Normal range of motion and neck supple.  Skin:    General: Skin is warm and dry.  Neurological:     General: No focal deficit present.     Mental Status: She is alert and oriented to person, place, and time.     ED Results / Procedures / Treatments   Labs (all labs ordered are listed, but only abnormal results are displayed) Labs Reviewed  URINALYSIS, ROUTINE W REFLEX MICROSCOPIC -  Abnormal; Notable for the following components:      Result Value   APPearance HAZY (*)    Hgb urine dipstick SMALL (*)    Protein, ur 30 (*)    Leukocytes,Ua LARGE (*)    Bacteria, UA RARE (*)    All other components within normal limits  BASIC METABOLIC PANEL - Abnormal; Notable for the following components:   Glucose, Bld 249 (*)    Calcium 8.2 (*)    All other components within normal limits  CBC - Abnormal; Notable for the following components:   WBC 16.2 (*)    All other components within normal limits  PREGNANCY, URINE    EKG None  Radiology No results found.  Procedures Procedures    Medications Ordered in ED Medications - No data to display  ED Course/ Medical Decision Making/ A&P  Patient is a 32 year old female presenting with right-sided abdominal and flank pain.  She also developed a fever at home.  Patient arrives with stable vital signs and is clinically well-appearing.  She does have some right sided abdominal and CVA tenderness, but no peritoneal signs.  Workup initiated including CBC, basic metabolic panel, and urinalysis.  She has a leukocytosis with white count of 16,000 and evidence for UTI.  Urine pregnancy test negative.  CT scan with renal protocol obtained to rule out renal calculus and to also evaluate for appendicitis.  This showed no evidence of either and no other acute intra-abdominal process.  At this point, patient to be treated for UTI with Keflex.  She is to follow-up as needed.  Final Clinical Impression(s) / ED Diagnoses Final diagnoses:  None    Rx / DC Orders ED Discharge Orders     None         Geoffery Lyons, MD 08/31/23 [phone]

## 2023-08-31 NOTE — Discharge Instructions (Signed)
Your urinalysis indicates that you have a urinary tract infection.  Your CAT scan showed no evidence of appendicitis or obstructing kidney stone.  Begin taking Keflex as prescribed.  Plenty of fluids.  Return to the emergency department if symptoms significantly worsen or change.

## 2023-09-04 ENCOUNTER — Ambulatory Visit: Payer: Medicaid Other | Admitting: "Endocrinology

## 2023-10-16 ENCOUNTER — Telehealth: Payer: Self-pay

## 2023-10-16 NOTE — Telephone Encounter (Signed)
LVM to call and schedule new patient appt with NP Gilmore Laroche

## 2023-11-08 ENCOUNTER — Ambulatory Visit: Payer: Medicaid Other | Admitting: Family Medicine

## 2023-11-08 VITALS — BP 152/86 | HR 104 | Ht 62.0 in | Wt 334.0 lb

## 2023-11-08 DIAGNOSIS — I10 Essential (primary) hypertension: Secondary | ICD-10-CM | POA: Diagnosis not present

## 2023-11-08 DIAGNOSIS — Z1159 Encounter for screening for other viral diseases: Secondary | ICD-10-CM

## 2023-11-08 DIAGNOSIS — E559 Vitamin D deficiency, unspecified: Secondary | ICD-10-CM | POA: Diagnosis not present

## 2023-11-08 DIAGNOSIS — E1169 Type 2 diabetes mellitus with other specified complication: Secondary | ICD-10-CM | POA: Diagnosis not present

## 2023-11-08 DIAGNOSIS — E119 Type 2 diabetes mellitus without complications: Secondary | ICD-10-CM

## 2023-11-08 DIAGNOSIS — E7849 Other hyperlipidemia: Secondary | ICD-10-CM

## 2023-11-08 DIAGNOSIS — E038 Other specified hypothyroidism: Secondary | ICD-10-CM | POA: Diagnosis not present

## 2023-11-08 DIAGNOSIS — Z114 Encounter for screening for human immunodeficiency virus [HIV]: Secondary | ICD-10-CM

## 2023-11-08 MED ORDER — AMLODIPINE-OLMESARTAN 10-20 MG PO TABS: 1.0000 | ORAL_TABLET | Freq: Every day | ORAL | 1 refills | Status: DC

## 2023-11-08 NOTE — Patient Instructions (Addendum)
I appreciate the opportunity to provide care to you today!    Follow up:  1 month for BP  Labs: please stop by the lab during the week to get your blood drawn (CBC, CMP, TSH, Lipid profile, HgA1c, Vit D)  Screening: HIV and Hep C  Hypertension Management  Your current blood pressure is above the target goal of <140/90 mmHg. To address this, please continue taking olmesartan-amlodipine 20-10 mg daily.  Medication Instructions: Take your blood pressure medication at the same time each day. After taking your medication, check your blood pressure at least an hour later. If your first reading is >140/90 mmHg, wait at least 10 minutes and recheck your blood pressure. Side Effects: In the initial days of therapy, you may experience dizziness or lightheadedness as your body adjusts to the lower blood pressure; this is expected. Diet and Lifestyle: Adhere to a low-sodium diet, limiting intake to less than 1500 mg daily, and increase your physical activity. Avoid over-the-counter NSAIDs such as ibuprofen and naproxen while on this medication. Hydration and Nutrition: Stay well-hydrated by drinking at least 64 ounces of water daily. Increase your servings of fruits and vegetables and avoid excessive sodium in your diet. Long-Term Considerations: Uncontrolled hypertension can increase the risk of cardiovascular diseases, including stroke, coronary artery disease, and heart failure.  Please report to the emergency department if your blood pressure exceeds 180/120 and is accompanied by symptoms such as headaches, chest pain, palpitations, blurred vision, or dizziness.    Attached with your AVS, you will find valuable resources for self-education. I highly recommend dedicating some time to thoroughly examine them.   Please continue to a heart-healthy diet and increase your physical activities. Try to exercise for at least five days a week.    It was a pleasure to see you and I look forward to  continuing to work together on your health and well-being. Please do not hesitate to call the office if you need care or have questions about your care.  In case of emergency, please visit the Emergency Department for urgent care, or contact our clinic at [phone] to schedule an appointment. We're here to help you!   Have a wonderful day and week. With Gratitude, Gilmore Laroche MSN, FNP-BC

## 2023-11-08 NOTE — Assessment & Plan Note (Signed)
The patient presents with uncontrolled blood pressure in the clinic. She reports being out of amlodipine 10 mg for about 4 months but is currently asymptomatic. I will initiate therapy with olmesartan-amlodipine 20-10 mg daily. Additionally, I encouraged a low-sodium diet and increased physical activity to help manage her blood pressure.  BP Readings from Last 3 Encounters:  11/08/23 (!) 152/86  08/31/23 (!) 151/83  07/08/23 (!) 130/91

## 2023-11-08 NOTE — Progress Notes (Signed)
New Patient Office Visit  Subjective:  Patient ID: Selena Barr, female    DOB: May 14, 1991  Age: 32 y.o. MRN: [id number]  CC:  Chief Complaint  Patient presents with   Establish Care    New patient establishing care. Needs to discuss htn , and dm 2 concerns, needs to be back on medications.    HPI Selena Barr is a 32 y.o. female with past medical history of hypertension, type 2 diabetes, and obesity presents for establishing care. For the details of today's visit, please refer to the assessment and plan.     Past Medical History:  Diagnosis Date   Allergy    Diabetes mellitus without complication (HCC)    Hyperlipidemia 10/24/2016   Hypertension    Tobacco abuse 09/22/2016    Past Surgical History:  Procedure Laterality Date   WISDOM TOOTH EXTRACTION      Family History  Problem Relation Age of Onset   Hypertension Mother    Diabetes Mother    Alcohol abuse Mother    COPD Mother    Drug abuse Mother    Cancer Maternal Grandmother        throat   Cancer Maternal Grandfather        throat   Diabetes Maternal Grandfather     Social History   Socioeconomic History   Marital status: Single    Spouse name: Not on file   Number of children: 1   Years of education: 12   Highest education level: 11th grade  Occupational History    Comment: unemployed  Tobacco Use   Smoking status: Every Day    Current packs/day: 1.00    Average packs/day: 1 pack/day for 2.0 years (2.0 ttl pk-yrs)    Types: Cigarettes   Smokeless tobacco: Never   Tobacco comments:    1ppd  Vaping Use   Vaping status: Never Used  Substance and Sexual Activity   Alcohol use: No   Drug use: No    Comment: denies use 08/28/14   Sexual activity: Yes    Birth control/protection: Implant  Other Topics Concern   Not on file  Social History Narrative   Lives with mother and brother, sister, daughter   Social Determinants of Health   Financial Resource Strain: Low Risk  (11/08/2023)    Overall Financial Resource Strain (CARDIA)    Difficulty of Paying Living Expenses: Not hard at all  Food Insecurity: No Food Insecurity (11/08/2023)   Hunger Vital Sign    Worried About Running Out of Food in the Last Year: Never true    Ran Out of Food in the Last Year: Never true  Transportation Needs: No Transportation Needs (11/08/2023)   PRAPARE - Administrator, Civil Service (Medical): No    Lack of Transportation (Non-Medical): No  Physical Activity: Sufficiently Active (11/08/2023)   Exercise Vital Sign    Days of Exercise per Week: 7 days    Minutes of Exercise per Session: 150+ min  Stress: Stress Concern Present (11/08/2023)   [employer] of Occupational Health - Occupational Stress Questionnaire    Feeling of Stress : Rather much  Social Connections: Moderately Isolated (11/08/2023)   Social Connection and Isolation Panel [NHANES]    Frequency of Communication with Friends and Family: More than three times a week    Frequency of Social Gatherings with Friends and Family: Once a week    Attends Religious Services: 1 to 4 times per year  Active Member of Clubs or Organizations: No    Attends Banker Meetings: Not on file    Marital Status: Never married  Intimate Partner Violence: Not At Risk (2018/08/08)   Humiliation, Afraid, Rape, and Kick questionnaire    Fear of Current or Ex-Partner: No    Emotionally Abused: No    Physically Abused: No    Sexually Abused: No    ROS Review of Systems  Constitutional:  Negative for chills and fever.  Eyes:  Negative for visual disturbance.  Respiratory:  Negative for chest tightness and shortness of breath.   Neurological:  Negative for dizziness and headaches.    Objective:   Today's Vitals: BP (!) 152/86 (BP Location: Left Arm)   Pulse (!) 104   Ht 5\' 2"  (1.575 m)   Wt (!) 334 lb 0.3 oz (151.5 kg)   SpO2 98%   BMI 61.09 kg/m   Physical Exam Constitutional:      Appearance: She is  obese.  HENT:     Head: Normocephalic.     Mouth/Throat:     Mouth: Mucous membranes are moist.  Cardiovascular:     Rate and Rhythm: Normal rate.     Heart sounds: Normal heart sounds.  Pulmonary:     Effort: Pulmonary effort is normal.     Breath sounds: Normal breath sounds.  Neurological:     Mental Status: She is alert.      Assessment & Plan:   Type 2 diabetes mellitus without complication, without long-term current use of insulin (HCC) Assessment & Plan: The patient was previously taking glipizide 5 mg daily but reports that she has been without her medication for 4 months. She denies experiencing polyuria, polyphagia, or polydipsia. I will assess her hemoglobin A1c today and adjust her medication as needed. Additionally, I encouraged her to decrease her intake of high-sugar foods and beverages and to increase her physical activity.   Orders: -     Microalbumin / creatinine urine ratio -     Hemoglobin A1c  Essential hypertension, benign Assessment & Plan: The patient presents with uncontrolled blood pressure in the clinic. She reports being out of amlodipine 10 mg for about 4 months but is currently asymptomatic. I will initiate therapy with olmesartan-amlodipine 20-10 mg daily. Additionally, I encouraged a low-sodium diet and increased physical activity to help manage her blood pressure.  BP Readings from Last 3 Encounters:  11/08/23 (!) 152/86  08/31/23 (!) 151/83  07/08/23 (!) 130/91       Orders: -     amLODIPine-Olmesartan; Take 1 tablet by mouth daily.  Dispense: 30 tablet; Refill: 1  Morbid obesity (HCC) Assessment & Plan: Recommendations for Weight Loss Management:  Emphasize Lifestyle Changes: A heart-healthy diet and increased physical activity are crucial. Healthy Tips for Weight Loss: Increase Intake of Nutrient-Rich Foods: Prioritize fruits, vegetables, and whole grains. Incorporate Lean Proteins: Include chicken, fish, beans, and legumes in your  diet. Choose Low-Fat Dairy Products: Opt for dairy products that are low in fat. Reduce Unhealthy Fats: Limit saturated fats, trans fatty acids, and cholesterol. Aim for Regular Physical Activity: Engage in at least 30 minutes of brisk walking or other physical activities on at least 5 days a week.    Vitamin D deficiency -     VITAMIN D 25 Hydroxy (Vit-D Deficiency, Fractures)  Need for hepatitis C screening test -     Hepatitis C antibody  Encounter for screening for HIV -     HIV  Antibody (routine testing w rflx)  TSH (thyroid-stimulating hormone deficiency) -     TSH + free T4  Other hyperlipidemia -     Lipid panel -     CMP14+EGFR -     CBC with Differential/Platelet   Note: This chart has been completed using Engineer, civil (consulting) software, and while attempts have been made to ensure accuracy, certain words and phrases may not be transcribed as intended.    Follow-up: Return in about 1 month (around 12/09/2023) for BP.   Gilmore Laroche, FNP

## 2023-11-08 NOTE — Assessment & Plan Note (Signed)
Recommendations for Weight Loss Management:  Emphasize Lifestyle Changes: A heart-healthy diet and increased physical activity are crucial. Healthy Tips for Weight Loss: Increase Intake of Nutrient-Rich Foods: Prioritize fruits, vegetables, and whole grains. Incorporate Lean Proteins: Include chicken, fish, beans, and legumes in your diet. Choose Low-Fat Dairy Products: Opt for dairy products that are low in fat. Reduce Unhealthy Fats: Limit saturated fats, trans fatty acids, and cholesterol. Aim for Regular Physical Activity: Engage in at least 30 minutes of brisk walking or other physical activities on at least 5 days a week.

## 2023-11-08 NOTE — Assessment & Plan Note (Signed)
The patient was previously taking glipizide 5 mg daily but reports that she has been without her medication for 4 months. She denies experiencing polyuria, polyphagia, or polydipsia. I will assess her hemoglobin A1c today and adjust her medication as needed. Additionally, I encouraged her to decrease her intake of high-sugar foods and beverages and to increase her physical activity.

## 2023-11-09 DIAGNOSIS — E7849 Other hyperlipidemia: Secondary | ICD-10-CM | POA: Diagnosis not present

## 2023-11-09 DIAGNOSIS — E038 Other specified hypothyroidism: Secondary | ICD-10-CM | POA: Diagnosis not present

## 2023-11-09 DIAGNOSIS — E559 Vitamin D deficiency, unspecified: Secondary | ICD-10-CM | POA: Diagnosis not present

## 2023-11-09 DIAGNOSIS — E119 Type 2 diabetes mellitus without complications: Secondary | ICD-10-CM | POA: Diagnosis not present

## 2023-11-09 DIAGNOSIS — Z114 Encounter for screening for human immunodeficiency virus [HIV]: Secondary | ICD-10-CM | POA: Diagnosis not present

## 2023-11-09 DIAGNOSIS — Z1159 Encounter for screening for other viral diseases: Secondary | ICD-10-CM | POA: Diagnosis not present

## 2023-11-11 ENCOUNTER — Other Ambulatory Visit: Payer: Self-pay | Admitting: Family Medicine

## 2023-11-11 DIAGNOSIS — E119 Type 2 diabetes mellitus without complications: Secondary | ICD-10-CM

## 2023-11-11 DIAGNOSIS — E782 Mixed hyperlipidemia: Secondary | ICD-10-CM

## 2023-11-11 MED ORDER — METFORMIN HCL 1000 MG PO TABS: 1000.0000 mg | ORAL_TABLET | Freq: Two times a day (BID) | ORAL | 2 refills | Status: DC

## 2023-11-11 MED ORDER — ROSUVASTATIN CALCIUM 10 MG PO TABS: 10.0000 mg | ORAL_TABLET | Freq: Every day | ORAL | 3 refills | Status: AC

## 2023-11-11 MED ORDER — OZEMPIC (0.25 OR 0.5 MG/DOSE) 2 MG/3ML ~~LOC~~ SOPN: 0.2500 mg | PEN_INJECTOR | SUBCUTANEOUS | 0 refills | Status: DC

## 2023-11-11 MED ORDER — VITAMIN D (ERGOCALCIFEROL) 1.25 MG (50000 UNIT) PO CAPS: 50000.0000 [IU] | ORAL_CAPSULE | ORAL | 1 refills | Status: AC

## 2023-11-13 ENCOUNTER — Telehealth: Payer: Self-pay | Admitting: "Endocrinology

## 2023-11-13 DIAGNOSIS — E119 Type 2 diabetes mellitus without complications: Secondary | ICD-10-CM

## 2023-11-13 DIAGNOSIS — E782 Mixed hyperlipidemia: Secondary | ICD-10-CM

## 2023-11-13 NOTE — Telephone Encounter (Signed)
 Order updated & mailed

## 2023-11-13 NOTE — Telephone Encounter (Signed)
Pt needs labs put in and mailed

## 2023-11-16 LAB — CMP14+EGFR
ALT: 15 [IU]/L (ref 0–32)
AST: 14 [IU]/L (ref 0–40)
Albumin: 4.1 g/dL (ref 3.9–4.9)
Alkaline Phosphatase: 99 [IU]/L (ref 44–121)
BUN/Creatinine Ratio: 18 (ref 9–23)
BUN: 11 mg/dL (ref 6–20)
Bilirubin Total: 0.2 mg/dL (ref 0.0–1.2)
CO2: 23 mmol/L (ref 20–29)
Calcium: 9.1 mg/dL (ref 8.7–10.2)
Chloride: 103 mmol/L (ref 96–106)
Creatinine, Ser: 0.61 mg/dL (ref 0.57–1.00)
Globulin, Total: 2.8 g/dL (ref 1.5–4.5)
Glucose: 214 mg/dL — ABNORMAL HIGH (ref 70–99)
Potassium: 4.5 mmol/L (ref 3.5–5.2)
Sodium: 140 mmol/L (ref 134–144)
Total Protein: 6.9 g/dL (ref 6.0–8.5)
eGFR: 122 mL/min/{1.73_m2} (ref >59)

## 2023-11-16 LAB — TSH+FREE T4
Free T4: 1.34 ng/dL (ref 0.82–1.77)
TSH: 0.704 u[IU]/mL (ref 0.450–4.500)

## 2023-11-16 LAB — CBC WITH DIFFERENTIAL/PLATELET
Basophils Absolute: 0.1 10*3/uL (ref 0.0–0.2)
Basos: 1 %
EOS (ABSOLUTE): 0.2 10*3/uL (ref 0.0–0.4)
Eos: 2 %
Hematocrit: 44 % (ref 34.0–46.6)
Hemoglobin: 14.2 g/dL (ref 11.1–15.9)
Immature Grans (Abs): 0 10*3/uL (ref 0.0–0.1)
Immature Granulocytes: 0 %
Lymphocytes Absolute: 2.1 10*3/uL (ref 0.7–3.1)
Lymphs: 22 %
MCH: 28.9 pg (ref 26.6–33.0)
MCHC: 32.3 g/dL (ref 31.5–35.7)
MCV: 89 fL (ref 79–97)
Monocytes Absolute: 0.5 10*3/uL (ref 0.1–0.9)
Monocytes: 5 %
Neutrophils Absolute: 6.9 10*3/uL (ref 1.4–7.0)
Neutrophils: 70 %
Platelets: 441 10*3/uL (ref 150–450)
RBC: 4.92 x10E6/uL (ref 3.77–5.28)
RDW: 12.6 % (ref 11.7–15.4)
WBC: 9.8 10*3/uL (ref 3.4–10.8)

## 2023-11-16 LAB — HEPATITIS C ANTIBODY: Hep C Virus Ab: NONREACTIVE

## 2023-11-16 LAB — LIPID PANEL
Chol/HDL Ratio: 5.5 {ratio} — ABNORMAL HIGH (ref 0.0–4.4)
Cholesterol, Total: 176 mg/dL (ref 100–199)
HDL: 32 mg/dL — ABNORMAL LOW (ref >39)
LDL Chol Calc (NIH): 123 mg/dL — ABNORMAL HIGH (ref 0–99)
Triglycerides: 117 mg/dL (ref 0–149)
VLDL Cholesterol Cal: 21 mg/dL (ref 5–40)

## 2023-11-16 LAB — MICROALBUMIN / CREATININE URINE RATIO

## 2023-11-16 LAB — HEMOGLOBIN A1C
Est. average glucose Bld gHb Est-mCnc: 209 mg/dL
Hgb A1c MFr Bld: 8.9 % — ABNORMAL HIGH (ref 4.8–5.6)

## 2023-11-16 LAB — VITAMIN D 25 HYDROXY (VIT D DEFICIENCY, FRACTURES): Vit D, 25-Hydroxy: 9 ng/mL — ABNORMAL LOW (ref 30.0–100.0)

## 2023-11-16 LAB — HIV ANTIBODY (ROUTINE TESTING W REFLEX): HIV Screen 4th Generation wRfx: NONREACTIVE

## 2023-11-23 ENCOUNTER — Emergency Department (HOSPITAL_COMMUNITY): Payer: Medicaid Other

## 2023-11-23 ENCOUNTER — Inpatient Hospital Stay (HOSPITAL_COMMUNITY)
Admission: EM | Admit: 2023-11-23 | Discharge: 2023-11-25 | DRG: 690 | Disposition: A | Discharge status: Home / Self Care | Payer: Medicaid Other | Attending: Internal Medicine | Admitting: Internal Medicine

## 2023-11-23 ENCOUNTER — Other Ambulatory Visit: Payer: Self-pay

## 2023-11-23 ENCOUNTER — Encounter (HOSPITAL_COMMUNITY): Payer: Self-pay

## 2023-11-23 DIAGNOSIS — Z811 Family history of alcohol abuse and dependence: Secondary | ICD-10-CM | POA: Diagnosis not present

## 2023-11-23 DIAGNOSIS — N390 Urinary tract infection, site not specified: Secondary | ICD-10-CM | POA: Diagnosis present

## 2023-11-23 DIAGNOSIS — Z833 Family history of diabetes mellitus: Secondary | ICD-10-CM | POA: Diagnosis not present

## 2023-11-23 DIAGNOSIS — Z6841 Body Mass Index (BMI) 40.0 and over, adult: Secondary | ICD-10-CM | POA: Diagnosis not present

## 2023-11-23 DIAGNOSIS — Z8249 Family history of ischemic heart disease and other diseases of the circulatory system: Secondary | ICD-10-CM | POA: Diagnosis not present

## 2023-11-23 DIAGNOSIS — F17210 Nicotine dependence, cigarettes, uncomplicated: Secondary | ICD-10-CM | POA: Diagnosis not present

## 2023-11-23 DIAGNOSIS — Z813 Family history of other psychoactive substance abuse and dependence: Secondary | ICD-10-CM | POA: Diagnosis not present

## 2023-11-23 DIAGNOSIS — N200 Calculus of kidney: Secondary | ICD-10-CM | POA: Diagnosis not present

## 2023-11-23 DIAGNOSIS — Z79899 Other long term (current) drug therapy: Secondary | ICD-10-CM | POA: Diagnosis not present

## 2023-11-23 DIAGNOSIS — D75839 Thrombocytosis, unspecified: Secondary | ICD-10-CM | POA: Diagnosis not present

## 2023-11-23 DIAGNOSIS — Z7984 Long term (current) use of oral hypoglycemic drugs: Secondary | ICD-10-CM

## 2023-11-23 DIAGNOSIS — K8020 Calculus of gallbladder without cholecystitis without obstruction: Secondary | ICD-10-CM | POA: Diagnosis not present

## 2023-11-23 DIAGNOSIS — N12 Tubulo-interstitial nephritis, not specified as acute or chronic: Secondary | ICD-10-CM | POA: Diagnosis not present

## 2023-11-23 DIAGNOSIS — Z825 Family history of asthma and other chronic lower respiratory diseases: Secondary | ICD-10-CM

## 2023-11-23 DIAGNOSIS — Z888 Allergy status to other drugs, medicaments and biological substances status: Secondary | ICD-10-CM

## 2023-11-23 DIAGNOSIS — Z7985 Long-term (current) use of injectable non-insulin antidiabetic drugs: Secondary | ICD-10-CM

## 2023-11-23 DIAGNOSIS — Z720 Tobacco use: Secondary | ICD-10-CM | POA: Diagnosis present

## 2023-11-23 DIAGNOSIS — N261 Atrophy of kidney (terminal): Secondary | ICD-10-CM | POA: Diagnosis not present

## 2023-11-23 DIAGNOSIS — I10 Essential (primary) hypertension: Secondary | ICD-10-CM | POA: Diagnosis not present

## 2023-11-23 DIAGNOSIS — E782 Mixed hyperlipidemia: Secondary | ICD-10-CM | POA: Diagnosis not present

## 2023-11-23 DIAGNOSIS — Z808 Family history of malignant neoplasm of other organs or systems: Secondary | ICD-10-CM

## 2023-11-23 DIAGNOSIS — N281 Cyst of kidney, acquired: Secondary | ICD-10-CM | POA: Diagnosis not present

## 2023-11-23 DIAGNOSIS — E1165 Type 2 diabetes mellitus with hyperglycemia: Secondary | ICD-10-CM | POA: Insufficient documentation

## 2023-11-23 LAB — COMPREHENSIVE METABOLIC PANEL
ALT: 22 U/L (ref 0–44)
AST: 17 U/L (ref 15–41)
Albumin: 3.6 g/dL (ref 3.5–5.0)
Alkaline Phosphatase: 77 U/L (ref 38–126)
Anion gap: 8 (ref 5–15)
BUN: 13 mg/dL (ref 6–20)
CO2: 28 mmol/L (ref 22–32)
Calcium: 8.6 mg/dL — ABNORMAL LOW (ref 8.9–10.3)
Chloride: 100 mmol/L (ref 98–111)
Creatinine, Ser: 1.01 mg/dL — ABNORMAL HIGH (ref 0.44–1.00)
GFR, Estimated: >60 mL/min (ref >60)
Glucose, Bld: 267 mg/dL — ABNORMAL HIGH (ref 70–99)
Potassium: 3.7 mmol/L (ref 3.5–5.1)
Sodium: 136 mmol/L (ref 135–145)
Total Bilirubin: 0.5 mg/dL (ref <1.2)
Total Protein: 7.7 g/dL (ref 6.5–8.1)

## 2023-11-23 LAB — URINALYSIS, ROUTINE W REFLEX MICROSCOPIC
Bilirubin Urine: NEGATIVE
Glucose, UA: 50 mg/dL — AB
Ketones, ur: NEGATIVE mg/dL
Nitrite: NEGATIVE
Protein, ur: 100 mg/dL — AB
RBC / HPF: >50 RBC/hpf (ref 0–5)
Specific Gravity, Urine: 1.017 (ref 1.005–1.030)
pH: 6 (ref 5.0–8.0)

## 2023-11-23 LAB — CBC WITH DIFFERENTIAL/PLATELET
Abs Immature Granulocytes: 0.03 10*3/uL (ref 0.00–0.07)
Basophils Absolute: 0 10*3/uL (ref 0.0–0.1)
Basophils Relative: 0 %
Eosinophils Absolute: 0.2 10*3/uL (ref 0.0–0.5)
Eosinophils Relative: 2 %
HCT: 44.9 % (ref 36.0–46.0)
Hemoglobin: 14.8 g/dL (ref 12.0–15.0)
Immature Granulocytes: 0 %
Lymphocytes Relative: 21 %
Lymphs Abs: 2.3 10*3/uL (ref 0.7–4.0)
MCH: 28.5 pg (ref 26.0–34.0)
MCHC: 33 g/dL (ref 30.0–36.0)
MCV: 86.3 fL (ref 80.0–100.0)
Monocytes Absolute: 0.5 10*3/uL (ref 0.1–1.0)
Monocytes Relative: 4 %
Neutro Abs: 7.9 10*3/uL — ABNORMAL HIGH (ref 1.7–7.7)
Neutrophils Relative %: 73 %
Platelets: 403 10*3/uL — ABNORMAL HIGH (ref 150–400)
RBC: 5.2 MIL/uL — ABNORMAL HIGH (ref 3.87–5.11)
RDW: 13.3 % (ref 11.5–15.5)
WBC: 10.9 10*3/uL — ABNORMAL HIGH (ref 4.0–10.5)
nRBC: 0 % (ref 0.0–0.2)

## 2023-11-23 LAB — PREGNANCY, URINE: Preg Test, Ur: NEGATIVE

## 2023-11-23 LAB — LIPASE, BLOOD: Lipase: 44 U/L (ref 11–51)

## 2023-11-23 LAB — GLUCOSE, CAPILLARY: Glucose-Capillary: 182 mg/dL — ABNORMAL HIGH (ref 70–99)

## 2023-11-23 MED ORDER — IRBESARTAN 150 MG PO TABS
150.0000 mg | ORAL_TABLET | Freq: Every day | ORAL | Status: DC
  Administered 2023-11-23 – 2023-11-25 (×3): 150 mg via ORAL
  Filled 2023-11-23 (×3): qty 1

## 2023-11-23 MED ORDER — ENOXAPARIN SODIUM 40 MG/0.4ML IJ SOSY: 40.0000 mg | PREFILLED_SYRINGE | INTRAMUSCULAR | Status: DC

## 2023-11-23 MED ORDER — ONDANSETRON HCL 4 MG/2ML IJ SOLN
4.0000 mg | Freq: Once | INTRAMUSCULAR | Status: AC
  Administered 2023-11-23: 4 mg via INTRAVENOUS
  Filled 2023-11-23: qty 2

## 2023-11-23 MED ORDER — AMLODIPINE BESYLATE 5 MG PO TABS
10.0000 mg | ORAL_TABLET | Freq: Every day | ORAL | Status: DC
  Administered 2023-11-23 – 2023-11-25 (×3): 10 mg via ORAL
  Filled 2023-11-23 (×3): qty 2

## 2023-11-23 MED ORDER — NICOTINE 21 MG/24HR TD PT24
21.0000 mg | MEDICATED_PATCH | Freq: Every day | TRANSDERMAL | Status: DC
Start: 2023-11-23 — End: 2023-11-25
  Administered 2023-11-23: 21 mg via TRANSDERMAL
  Filled 2023-11-23 (×3): qty 1

## 2023-11-23 MED ORDER — ACETAMINOPHEN 650 MG RE SUPP: 650.0000 mg | Freq: Four times a day (QID) | RECTAL | Status: DC | PRN

## 2023-11-23 MED ORDER — ONDANSETRON HCL 4 MG PO TABS: 4.0000 mg | ORAL_TABLET | Freq: Four times a day (QID) | ORAL | Status: DC | PRN

## 2023-11-23 MED ORDER — ONDANSETRON HCL 4 MG/2ML IJ SOLN: 4.0000 mg | Freq: Four times a day (QID) | INTRAMUSCULAR | Status: DC | PRN

## 2023-11-23 MED ORDER — NICOTINE 21 MG/24HR TD PT24: 21.0000 mg | MEDICATED_PATCH | Freq: Every day | TRANSDERMAL | Status: DC

## 2023-11-23 MED ORDER — INSULIN ASPART 100 UNIT/ML IJ SOLN: 0.0000 [IU] | Freq: Three times a day (TID) | INTRAMUSCULAR | Status: DC

## 2023-11-23 MED ORDER — IRBESARTAN 150 MG PO TABS
150.0000 mg | ORAL_TABLET | Freq: Every day | ORAL | Status: DC
Start: 2023-11-24 — End: 2023-11-23

## 2023-11-23 MED ORDER — SODIUM CHLORIDE 0.9 % IV SOLN
2.0000 g | Freq: Once | INTRAVENOUS | Status: AC
  Administered 2023-11-23: 2 g via INTRAVENOUS
  Filled 2023-11-23: qty 20

## 2023-11-23 MED ORDER — AMLODIPINE BESYLATE 5 MG PO TABS: 10.0000 mg | ORAL_TABLET | Freq: Every day | ORAL | Status: DC

## 2023-11-23 MED ORDER — AMLODIPINE-OLMESARTAN 10-20 MG PO TABS: 1.0000 | ORAL_TABLET | Freq: Every day | ORAL | Status: DC

## 2023-11-23 MED ORDER — KETOROLAC TROMETHAMINE 15 MG/ML IJ SOLN
15.0000 mg | Freq: Once | INTRAMUSCULAR | Status: DC
  Filled 2023-11-23: qty 1

## 2023-11-23 MED ORDER — ENOXAPARIN SODIUM 80 MG/0.8ML IJ SOSY
0.5000 mg/kg | PREFILLED_SYRINGE | INTRAMUSCULAR | Status: DC
  Filled 2023-11-23: qty 0.8

## 2023-11-23 MED ORDER — ROSUVASTATIN CALCIUM 10 MG PO TABS
10.0000 mg | ORAL_TABLET | Freq: Every day | ORAL | Status: DC
  Administered 2023-11-24 – 2023-11-25 (×2): 10 mg via ORAL
  Filled 2023-11-23 (×2): qty 1

## 2023-11-23 MED ORDER — ACETAMINOPHEN 325 MG PO TABS: 650.0000 mg | ORAL_TABLET | Freq: Four times a day (QID) | ORAL | Status: DC | PRN

## 2023-11-23 NOTE — Progress Notes (Signed)
Called [facility] to adjust patient's Azor and nicotine patch for tonight instead of in the AM tomorrow.

## 2023-11-23 NOTE — ED Provider Notes (Cosign Needed Addendum)
[place] EMERGENCY DEPARTMENT AT [facility] Provider Note   CSN: [id number] Arrival date & time: 11/23/23  1333     History  Chief Complaint  Patient presents with   Flank Pain    Selena Barr is a 32 y.o. female.  She of diabetes, presents for sharp right flank pain started over the past day with dysuria and frequency.  No fever or chills, having some nausea with it as well.   Flank Pain       Home Medications Prior to Admission medications   Medication Sig Start Date End Date Taking? Authorizing Provider  albuterol (VENTOLIN HFA) 108 (90 Base) MCG/ACT inhaler Inhale 1 puff into the lungs every 6 (six) hours as needed for wheezing or shortness of breath. Patient not taking: Reported on 11/08/2023 10/13/22   Valentino Nose, NP  amlodipine-olmesartan (AZOR) 10-20 MG tablet Take 1 tablet by mouth daily. 11/08/23   Gilmore Laroche, FNP  Etonogestrel (NEXPLANON [place]) Inject into the skin.    [provider]  glipiZIDE (GLUCOTROL XL) 5 MG 24 hr tablet Take 1 tablet (5 mg total) by mouth daily with breakfast. Patient not taking: Reported on 11/08/2023 02/20/22   Roma Kayser, MD  glucose blood (ACCU-CHEK GUIDE) test strip Use to monitor glucose 2 times a day Patient not taking: Reported on 11/08/2023 02/21/22   Roma Kayser, MD  glucose blood test strip 1 each by Other route as needed. Use to test blood sugar twice daily as instructed Patient not taking: Reported on 11/08/2023    [provider]  Lancets MISC USE TO CHECK BLOOD GLUCOSE TWICE DAILY Patient not taking: Reported on 11/08/2023 02/21/22   Roma Kayser, MD  metFORMIN (GLUCOPHAGE) 1000 MG tablet Take 1 tablet (1,000 mg total) by mouth 2 (two) times daily with a meal. 11/11/23   Gilmore Laroche, FNP  rosuvastatin (CRESTOR) 10 MG tablet Take 1 tablet (10 mg total) by mouth daily. 11/11/23   Gilmore Laroche, FNP  Semaglutide,0.25 or 0.5MG /DOS, (OZEMPIC, 0.25 OR 0.5  MG/DOSE,) 2 MG/3ML SOPN Inject 0.25 mg into the skin once a week. 11/11/23   Gilmore Laroche, FNP  Vitamin D, Ergocalciferol, (DRISDOL) 1.25 MG (50000 UNIT) CAPS capsule Take 1 capsule (50,000 Units total) by mouth every 7 (seven) days. 11/11/23   Gilmore Laroche, FNP  hydrochlorothiazide (HYDRODIURIL) 25 MG tablet Take 1 tablet (25 mg total) by mouth daily. Patient not taking: Reported on 03/05/2019 02/06/19 05/06/20  Cheral Marker, CNM  norethindrone (MICRONOR,CAMILA,ERRIN) 0.35 MG tablet Take 1 tablet (0.35 mg total) by mouth daily. 02/03/19 03/06/20  Cheral Marker, CNM      Allergies    Solu-medrol [methylprednisolone sodium succ]    Review of Systems   Review of Systems  Genitourinary:  Positive for flank pain.    Physical Exam Updated Vital Signs BP (!) 154/94   Pulse 98   Temp 98.2 F (36.8 C) (Oral)   Resp 18   Ht 5\' 3"  (1.6 m)   Wt (!) 151.5 kg   SpO2 100%   BMI 59.17 kg/m  Physical Exam Vitals and nursing note reviewed.  Constitutional:      General: She is not in acute distress.    Appearance: She is well-developed.  HENT:     Head: Normocephalic and atraumatic.  Eyes:     Conjunctiva/sclera: Conjunctivae normal.  Cardiovascular:     Rate and Rhythm: Normal rate and regular rhythm.     Heart sounds: No murmur  heard. Pulmonary:     Effort: Pulmonary effort is normal. No respiratory distress.     Breath sounds: Normal breath sounds.  Abdominal:     Palpations: Abdomen is soft.     Tenderness: There is no abdominal tenderness.  Musculoskeletal:        General: No swelling.     Cervical back: Neck supple.  Skin:    General: Skin is warm and dry.     Capillary Refill: Capillary refill takes less than 2 seconds.  Neurological:     General: No focal deficit present.     Mental Status: She is alert and oriented to person, place, and time.  Psychiatric:        Mood and Affect: Mood normal.     ED Results / Procedures / Treatments   Labs (all labs ordered  are listed, but only abnormal results are displayed) Labs Reviewed  URINALYSIS, ROUTINE W REFLEX MICROSCOPIC - Abnormal; Notable for the following components:      Result Value   APPearance CLOUDY (*)    Glucose, UA 50 (*)    Hgb urine dipstick LARGE (*)    Protein, ur 100 (*)    Leukocytes,Ua LARGE (*)    Bacteria, UA FEW (*)    All other components within normal limits  CBC WITH DIFFERENTIAL/PLATELET - Abnormal; Notable for the following components:   WBC 10.9 (*)    RBC 5.20 (*)    Platelets 403 (*)    Neutro Abs 7.9 (*)    All other components within normal limits  COMPREHENSIVE METABOLIC PANEL - Abnormal; Notable for the following components:   Glucose, Bld 267 (*)    Creatinine, Ser 1.01 (*)    Calcium 8.6 (*)    All other components within normal limits  PREGNANCY, URINE  LIPASE, BLOOD    EKG None  Radiology CT Renal Stone Study Result Date: 11/23/2023 CLINICAL DATA:  Abdominal/flank pain, stone suspected EXAM: CT ABDOMEN AND PELVIS WITHOUT CONTRAST TECHNIQUE: Multidetector CT imaging of the abdomen and pelvis was performed following the standard protocol without IV contrast. RADIATION DOSE REDUCTION: This exam was performed according to the departmental dose-optimization program which includes automated exposure control, adjustment of the mA and/or kV according to patient size and/or use of iterative reconstruction technique. COMPARISON:  CT abdomen/pelvis dated August 31, 2023. FINDINGS: Lower chest: No acute abnormality. Hepatobiliary: No focal liver abnormality is seen, within the limits of an unenhanced exam. Cholelithiasis. No definite gallbladder wall thickening or pericholecystic fluid. No biliary dilatation. Pancreas: Unremarkable. No pancreatic ductal dilatation or surrounding inflammatory changes. Spleen: Normal in size without focal abnormality. Adrenals/Urinary Tract: The adrenal glands are unremarkable. Marked atrophy of the left kidney with unchanged 1.5 cm  simple left renal cyst. Compensatory hypertrophy of the right kidney is again noted. Foci of gas are noted within mildly prominent right upper pole renal calyces (series 2, images 31 and 33). No definite evidence of air in the parenchyma. The bladder is partially distended with small foci of gas noted in the nondependent aspect. No bladder calculi identified. Stomach/Bowel: Stomach is within normal limits. Appendix appears normal. No evidence of bowel wall thickening, distention, or inflammatory changes. Vascular/Lymphatic: No significant vascular findings are present. Stable subcentimeter aortocaval and para-aortic lymph nodes. Reproductive: Uterus and bilateral adnexa are unremarkable. Other: No abdominal wall hernia or abnormality. No abdominopelvic ascites. No intraperitoneal free air. Musculoskeletal: No acute or significant osseous findings. IMPRESSION: 1. Foci of gas noted within mildly prominent right upper pole  renal collecting system and small foci of gas noted in the nondependent aspect of the bladder. 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 Evaluation for pyelonephritis is limited due to lack of intravenous contrast. 2. 2 mm nonobstructive calculus in the inferior pole of the right kidney. 3. Cholelithiasis. 4. Additional unchanged ancillary findings, as described above. Electronically Signed   By: Hart Robinsons M.D.   On: 11/23/2023 17:08    Procedures Procedures    Medications Ordered in ED Medications  cefTRIAXone (ROCEPHIN) 2 g in sodium chloride 0.9 % 100 mL IVPB (has no administration in time range)    ED Course/ Medical Decision Making/ A&P                                 Medical Decision Making Differential diagnosis includes renal colic, pyelonephritis, ureterolithiasis, appendicitis, UTI, other  ED course: Patient here with right flank pain and UTI symptoms for the past day, pain is sharp and intermittent.  Labs and imaging were actually ordered from  triage due to wait time.  He has mild leukocytosis, hyperglycemia and UTI.  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  I consulted with the on-call urologist Dr. Liliane Shi.  He advised patient will not need any procedures, he viewed images himself, patient needs to be admitted for IV antibiotics.  Pending hospitalist call at this time for IV and biotics.  Patient waiting room, nursing knows patient having to move to 10 when clean.  IV antibiotics ordered.  Patient does not meet sepsis criteria.  Amount and/or Complexity of Data Reviewed Labs: ordered. Radiology: ordered.  Risk Prescription drug management. Decision regarding hospitalization.           Final Clinical Impression(s) / ED Diagnoses Final diagnoses:  None    Rx / DC Orders ED Discharge Orders     None         Ma Rings, PA-C 11/23/23 1920    Ma Rings, PA-C 11/23/23 1929    Royanne Foots, DO 11/25/23 2354

## 2023-11-23 NOTE — ED Notes (Signed)
ED TO INPATIENT HANDOFF REPORT  ED Nurse Name and Phone #: Jacques Earthly Name/Age/Gender Selena Barr 32 y.o. female Room/Bed: APA10/APA10  Code Status   Code Status: Full Code  Home/SNF/Other Home Patient oriented to: self, place, time, and situation Is this baseline? Yes   Triage Complete: Triage complete  Chief Complaint UTI (urinary tract infection) [N39.0]  Triage Note RIGHT flank pain Started 1pm Urgency to pee, unable to  Complains of nausea    Allergies Allergies  Allergen Reactions   Solu-Medrol [Methylprednisolone Sodium Succ]     Rash, lip swelling     Level of Care/Admitting Diagnosis ED Disposition     ED Disposition  Admit   Condition  --   Comment  Hospital Area: [facility] [[id number]]  Level of Care: Med-Surg [16]  Covid Evaluation: Asymptomatic - no recent exposure (last 10 days) testing not required  Diagnosis: UTI (urinary tract infection) [657846]  Admitting Physician: Frankey Shown [[id number]]  Attending Physician: Frankey Shown [[id number]]  Certification:: I certify this patient will need inpatient services for at least 2 midnights  Expected Medical Readiness: 11/26/2023          B Medical/Surgery History Past Medical History:  Diagnosis Date   Allergy    Diabetes mellitus without complication (HCC)    Hyperlipidemia 10/24/2016   Hypertension    Tobacco abuse 09/22/2016   Past Surgical History:  Procedure Laterality Date   WISDOM TOOTH EXTRACTION       A IV Location/Drains/Wounds Patient Lines/Drains/Airways Status     Active Line/Drains/Airways     Name Placement date Placement time Site Days   Peripheral IV 11/23/23 20 G Left Antecubital 11/23/23  1955  Antecubital  less than 1            Intake/Output Last 24 hours No intake or output data in the 24 hours ending 11/23/23 2010  Labs/Imaging Results for orders placed or performed during the hospital encounter of 11/23/23 (from the past 48  hours)  Urinalysis, Routine w reflex microscopic -Urine, Clean Catch     Status: Abnormal   Collection Time: 11/23/23  1:51 PM  Result Value Ref Range   Color, Urine YELLOW YELLOW   APPearance CLOUDY (A) CLEAR   Specific Gravity, Urine 1.017 1.005 - 1.030   pH 6.0 5.0 - 8.0   Glucose, UA 50 (A) NEGATIVE mg/dL   Hgb urine dipstick LARGE (A) NEGATIVE   Bilirubin Urine NEGATIVE NEGATIVE   Ketones, ur NEGATIVE NEGATIVE mg/dL   Protein, ur 102 (A) NEGATIVE mg/dL   Nitrite NEGATIVE NEGATIVE   Leukocytes,Ua LARGE (A) NEGATIVE   RBC / HPF >50 0 - 5 RBC/hpf   WBC, UA 21-50 0 - 5 WBC/hpf   Bacteria, UA FEW (A) NONE SEEN   Squamous Epithelial / HPF 0-5 0 - 5 /HPF   Mucus PRESENT     Comment: Performed at [facility], [address], [place], [place] [address]  Pregnancy, urine     Status: None   Collection Time: 11/23/23  1:51 PM  Result Value Ref Range   Preg Test, Ur NEGATIVE NEGATIVE    Comment:        THE SENSITIVITY OF THIS METHODOLOGY IS >25 mIU/mL. Performed at [facility], [address], [place], [place] [address]   CBC with Differential     Status: Abnormal   Collection Time: 11/23/23  2:22 PM  Result Value Ref Range   WBC 10.9 (H) 4.0 - 10.5 K/uL   RBC  5.20 (H) 3.87 - 5.11 MIL/uL   Hemoglobin 14.8 12.0 - 15.0 g/dL   HCT 32.4 40.1 - 02.7 %   MCV 86.3 80.0 - 100.0 fL   MCH 28.5 26.0 - 34.0 pg   MCHC 33.0 30.0 - 36.0 g/dL   RDW 25.3 66.4 - 40.3 %   Platelets 403 (H) 150 - 400 K/uL   nRBC 0.0 0.0 - 0.2 %   Neutrophils Relative % 73 %   Neutro Abs 7.9 (H) 1.7 - 7.7 K/uL   Lymphocytes Relative 21 %   Lymphs Abs 2.3 0.7 - 4.0 K/uL   Monocytes Relative 4 %   Monocytes Absolute 0.5 0.1 - 1.0 K/uL   Eosinophils Relative 2 %   Eosinophils Absolute 0.2 0.0 - 0.5 K/uL   Basophils Relative 0 %   Basophils Absolute 0.0 0.0 - 0.1 K/uL   Immature Granulocytes 0 %   Abs Immature Granulocytes 0.03 0.00 - 0.07 K/uL    Comment: Performed at [facility], [address],  [place], [place] [address]  Comprehensive metabolic panel     Status: Abnormal   Collection Time: 11/23/23  2:22 PM  Result Value Ref Range   Sodium 136 135 - 145 mmol/L   Potassium 3.7 3.5 - 5.1 mmol/L   Chloride 100 98 - 111 mmol/L   CO2 28 22 - 32 mmol/L   Glucose, Bld 267 (H) 70 - 99 mg/dL    Comment: Glucose reference range applies only to samples taken after fasting for at least 8 hours.   BUN 13 6 - 20 mg/dL   Creatinine, Ser 9.56 (H) 0.44 - 1.00 mg/dL   Calcium 8.6 (L) 8.9 - 10.3 mg/dL   Total Protein 7.7 6.5 - 8.1 g/dL   Albumin 3.6 3.5 - 5.0 g/dL   AST 17 15 - 41 U/L   ALT 22 0 - 44 U/L   Alkaline Phosphatase 77 38 - 126 U/L   Total Bilirubin 0.5 <1.2 mg/dL   GFR, Estimated >38 >75 mL/min    Comment: (NOTE) Calculated using the CKD-EPI Creatinine Equation (2021)    Anion gap 8 5 - 15    Comment: Performed at [facility], [address] [place], [place] [address]  Lipase, blood     Status: None   Collection Time: 11/23/23  2:22 PM  Result Value Ref Range   Lipase 44 11 - 51 U/L    Comment: Performed at [facility], [address] [place], [place] [address]   CT Renal Stone Study Result Date: 11/23/2023 CLINICAL DATA:  Abdominal/flank pain, stone suspected EXAM: CT ABDOMEN AND PELVIS WITHOUT CONTRAST TECHNIQUE: Multidetector CT imaging of the abdomen and pelvis was performed following the standard protocol without IV contrast. RADIATION DOSE REDUCTION: This exam was performed according to the departmental dose-optimization program which includes automated exposure control, adjustment of the mA and/or kV according to patient size and/or use of iterative reconstruction technique. COMPARISON:  CT abdomen/pelvis dated August 31, 2023. FINDINGS: Lower chest: No acute abnormality. Hepatobiliary: No focal liver abnormality is seen, within the limits of an unenhanced exam. Cholelithiasis. No definite gallbladder wall thickening or pericholecystic fluid. No biliary dilatation.  Pancreas: Unremarkable. No pancreatic ductal dilatation or surrounding inflammatory changes. Spleen: Normal in size without focal abnormality. Adrenals/Urinary Tract: The adrenal glands are unremarkable. Marked atrophy of the left kidney with unchanged 1.5 cm simple left renal cyst. Compensatory hypertrophy of the right kidney is again noted. Foci of gas are noted within mildly prominent right  upper pole renal calyces (series 2, images 31 and 33). No definite evidence of air in the parenchyma. The bladder is partially distended with small foci of gas noted in the nondependent aspect. No bladder calculi identified. Stomach/Bowel: Stomach is within normal limits. Appendix appears normal. No evidence of bowel wall thickening, distention, or inflammatory changes. Vascular/Lymphatic: No significant vascular findings are present. Stable subcentimeter aortocaval and para-aortic lymph nodes. Reproductive: Uterus and bilateral adnexa are unremarkable. Other: No abdominal wall hernia or abnormality. No abdominopelvic ascites. No intraperitoneal free air. Musculoskeletal: No acute or significant osseous findings. IMPRESSION: 1. Foci of gas noted within mildly prominent right upper pole renal collecting system and small foci of gas noted in the nondependent aspect of the bladder. 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 Evaluation for pyelonephritis is limited due to lack of intravenous contrast. 2. 2 mm nonobstructive calculus in the inferior pole of the right kidney. 3. Cholelithiasis. 4. Additional unchanged ancillary findings, as described above. Electronically Signed   By: Hart Robinsons M.D.   On: 11/23/2023 17:08    Pending Labs Unresulted Labs (From admission, onward)     Start     Ordered   11/30/23 0500  Creatinine, serum  (enoxaparin (LOVENOX)    CrCl >/= 30 ml/min)  Weekly,   R     Comments: while on enoxaparin therapy    11/23/23 2008   11/24/23 0500  Comprehensive metabolic panel   Tomorrow morning,   R        11/23/23 2008   11/24/23 0500  CBC  Tomorrow morning,   R        11/23/23 2008   11/23/23 2008  CBC  (enoxaparin (LOVENOX)    CrCl >/= 30 ml/min)  Once,   R       Comments: Baseline for enoxaparin therapy IF NOT ALREADY DRAWN.  Notify MD if PLT < 100 K.    11/23/23 2008   11/23/23 2008  Creatinine, serum  (enoxaparin (LOVENOX)    CrCl >/= 30 ml/min)  Once,   R       Comments: Baseline for enoxaparin therapy IF NOT ALREADY DRAWN.    11/23/23 2008   11/23/23 1928  Urine Culture  Once,   URGENT       Question:  Indication  Answer:  Flank Pain   11/23/23 1927            Vitals/Pain Today's Vitals   11/23/23 1352 11/23/23 1942 11/23/23 2000 11/23/23 2001  BP: (!) 154/94  (!) 167/106   Pulse: 98 90 84   Resp: 18  18   Temp: 98.2 F (36.8 C)  98 F (36.7 C)   TempSrc: Oral  Oral   SpO2: 100% 100% 100%   Weight:      Height:      PainSc:    0-No pain    Isolation Precautions No active isolations  Medications Medications  cefTRIAXone (ROCEPHIN) 2 g in sodium chloride 0.9 % 100 mL IVPB (2 g Intravenous New Bag/Given 11/23/23 1959)  ketorolac (TORADOL) 15 MG/ML injection 15 mg (15 mg Intravenous Patient Refused/Not Given 11/23/23 1957)  acetaminophen (TYLENOL) tablet 650 mg (has no administration in time range)    Or  acetaminophen (TYLENOL) suppository 650 mg (has no administration in time range)  ondansetron (ZOFRAN) tablet 4 mg (has no administration in time range)    Or  ondansetron (ZOFRAN) injection 4 mg (has no administration in time range)  ondansetron (ZOFRAN) injection 4 mg (4  mg Intravenous Given 11/23/23 1955)    Mobility walks     Focused Assessments    R Recommendations: See Admitting Provider Note  Report given to:   Additional Notes: A&O; Ambu; 20G LAC;

## 2023-11-23 NOTE — ED Triage Notes (Signed)
RIGHT flank pain Started 1pm Urgency to pee, unable to  Complains of nausea

## 2023-11-23 NOTE — H&P (Addendum)
History and Physical    Patient: Selena Barr WUJ:[id number] DOB: 23-Oct-1991 DOA: 11/23/2023 DOS: the patient was seen and examined on 11/23/2023 PCP: Gilmore Laroche, FNP  Patient coming from: Home  Chief Complaint:  Chief Complaint  Patient presents with   Flank Pain   HPI: Selena Barr is a 32 y.o. female with medical history significant of hypertension, T2DM, hyperlipidemia, atrophic left kidney who presents to the emergency department due to right flank and right lower quadrant pain which was sharp in nature.  Pain was intermittent with no known aggravating/alleviating factors.  This was associated with nausea, urinary frequency and painful urination.  She denies fever, chills, chest pain or shortness of breath.  ED Course:  In the emergency department, BP was 154/94, other vital signs were within normal range.  Workup in the ED showed normal CBC except for thrombocytosis.  BMP was normal except for hyperglycemia.  Urinalysis was positive for UTI.  Lipase 44, pregnancy test was negative. CT abdomen and pelvis without contrast showed foci of gas noted within mildly prominent right upper pole renal collecting system and small foci of gas noted in the nondependent aspect of the bladder.  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  Evaluation for pyelonephritis is limited due to lack of intravenous contrast.  2 mm nonobstructive calculus in the inferior pole of the right kidney.  Cholelithiasis. Urologist on-call (Dr. Liliane Shi) was consulted and he advised there was no need for any procedures and the patient will require IV antibiotics. Patient was started on IV ceftriaxone.  IV Toradol 15 mg x 1 and Zofran 4 mg x 1 was given.  Hospitalist was asked admit patient for further evaluation and management.   Review of Systems: Review of systems as noted in the HPI. All other systems reviewed and are negative.   Past Medical History:  Diagnosis Date   Allergy    Diabetes  mellitus without complication (HCC)    Hyperlipidemia 10/24/2016   Hypertension    Tobacco abuse 09/22/2016   Past Surgical History:  Procedure Laterality Date   WISDOM TOOTH EXTRACTION      Social History:  reports that she has been smoking cigarettes. She has a 2 pack-year smoking history. She has never used smokeless tobacco. She reports that she does not drink alcohol and does not use drugs.   Allergies  Allergen Reactions   Solu-Medrol [Methylprednisolone Sodium Succ]     Rash, lip swelling     Family History  Problem Relation Age of Onset   Hypertension Mother    Diabetes Mother    Alcohol abuse Mother    COPD Mother    Drug abuse Mother    Cancer Maternal Grandmother        throat   Cancer Maternal Grandfather        throat   Diabetes Maternal Grandfather      Prior to Admission medications   Medication Sig Start Date End Date Taking? Authorizing Provider  albuterol (VENTOLIN HFA) 108 (90 Base) MCG/ACT inhaler Inhale 1 puff into the lungs every 6 (six) hours as needed for wheezing or shortness of breath. Patient not taking: Reported on 11/08/2023 10/13/22   Valentino Nose, NP  amlodipine-olmesartan (AZOR) 10-20 MG tablet Take 1 tablet by mouth daily. 11/08/23   Gilmore Laroche, FNP  Etonogestrel (NEXPLANON [place]) Inject into the skin.    [provider]  glipiZIDE (GLUCOTROL XL) 5 MG 24 hr tablet Take 1 tablet (5 mg total) by mouth daily  with breakfast. Patient not taking: Reported on 11/08/2023 02/20/22   Roma Kayser, MD  glucose blood (ACCU-CHEK GUIDE) test strip Use to monitor glucose 2 times a day Patient not taking: Reported on 11/08/2023 02/21/22   Roma Kayser, MD  glucose blood test strip 1 each by Other route as needed. Use to test blood sugar twice daily as instructed Patient not taking: Reported on 11/08/2023    [provider]  Lancets MISC USE TO CHECK BLOOD GLUCOSE TWICE DAILY Patient not taking: Reported on 11/08/2023  02/21/22   Roma Kayser, MD  metFORMIN (GLUCOPHAGE) 1000 MG tablet Take 1 tablet (1,000 mg total) by mouth 2 (two) times daily with a meal. 11/11/23   Gilmore Laroche, FNP  rosuvastatin (CRESTOR) 10 MG tablet Take 1 tablet (10 mg total) by mouth daily. 11/11/23   Gilmore Laroche, FNP  Semaglutide,0.25 or 0.5MG /DOS, (OZEMPIC, 0.25 OR 0.5 MG/DOSE,) 2 MG/3ML SOPN Inject 0.25 mg into the skin once a week. 11/11/23   Gilmore Laroche, FNP  Vitamin D, Ergocalciferol, (DRISDOL) 1.25 MG (50000 UNIT) CAPS capsule Take 1 capsule (50,000 Units total) by mouth every 7 (seven) days. 11/11/23   Gilmore Laroche, FNP  hydrochlorothiazide (HYDRODIURIL) 25 MG tablet Take 1 tablet (25 mg total) by mouth daily. Patient not taking: Reported on 03/05/2019 02/06/19 05/06/20  Cheral Marker, CNM  norethindrone (MICRONOR,CAMILA,ERRIN) 0.35 MG tablet Take 1 tablet (0.35 mg total) by mouth daily. 02/03/19 03/06/20  Cheral Marker, CNM    Physical Exam: BP (!) 167/106 (BP Location: Right Wrist)   Pulse 84   Temp 98 F (36.7 C) (Oral)   Resp 18   Ht 5\' 3"  (1.6 m)   Wt (!) 151.5 kg   SpO2 100%   BMI 59.17 kg/m   General: 32 y.o. year-old female well developed well nourished in no acute distress.  Alert and oriented x3. HEENT: NCAT, EOMI Neck: Supple, trachea medial Cardiovascular: Regular rate and rhythm with no rubs or gallops.  No thyromegaly or JVD noted.  No lower extremity edema. 2/4 pulses in all 4 extremities. Respiratory: Clear to auscultation with no wheezes or rales. Good inspiratory effort. Abdomen: Soft, nontender nondistended with normal bowel sounds x4 quadrants. Muskuloskeletal: No cyanosis, clubbing or edema noted bilaterally Neuro: CN II-XII intact, strength 5/5 x 4, sensation, reflexes intact Skin: No ulcerative lesions noted or rashes Psychiatry: Judgement and insight appear normal. Mood is appropriate for condition and setting          Labs on Admission:  Basic Metabolic Panel: Recent  Labs  Lab 11/23/23 1422  NA 136  K 3.7  CL 100  CO2 28  GLUCOSE 267*  BUN 13  CREATININE 1.01*  CALCIUM 8.6*   Liver Function Tests: Recent Labs  Lab 11/23/23 1422  AST 17  ALT 22  ALKPHOS 77  BILITOT 0.5  PROT 7.7  ALBUMIN 3.6   Recent Labs  Lab 11/23/23 1422  LIPASE 44   No results for input(s): "AMMONIA" in the last 168 hours. CBC: Recent Labs  Lab 11/23/23 1422  WBC 10.9*  NEUTROABS 7.9*  HGB 14.8  HCT 44.9  MCV 86.3  PLT 403*   Cardiac Enzymes: No results for input(s): "CKTOTAL", "CKMB", "CKMBINDEX", "TROPONINI" in the last 168 hours.  BNP (last 3 results) No results for input(s): "BNP" in the last 8760 hours.  ProBNP (last 3 results) No results for input(s): "PROBNP" in the last 8760 hours.  CBG: No results for input(s): "GLUCAP" in the last  168 hours.  Radiological Exams on Admission: CT Renal Stone Study Result Date: 11/23/2023 CLINICAL DATA:  Abdominal/flank pain, stone suspected EXAM: CT ABDOMEN AND PELVIS WITHOUT CONTRAST TECHNIQUE: Multidetector CT imaging of the abdomen and pelvis was performed following the standard protocol without IV contrast. RADIATION DOSE REDUCTION: This exam was performed according to the departmental dose-optimization program which includes automated exposure control, adjustment of the mA and/or kV according to patient size and/or use of iterative reconstruction technique. COMPARISON:  CT abdomen/pelvis dated August 31, 2023. FINDINGS: Lower chest: No acute abnormality. Hepatobiliary: No focal liver abnormality is seen, within the limits of an unenhanced exam. Cholelithiasis. No definite gallbladder wall thickening or pericholecystic fluid. No biliary dilatation. Pancreas: Unremarkable. No pancreatic ductal dilatation or surrounding inflammatory changes. Spleen: Normal in size without focal abnormality. Adrenals/Urinary Tract: The adrenal glands are unremarkable. Marked atrophy of the left kidney with unchanged 1.5 cm  simple left renal cyst. Compensatory hypertrophy of the right kidney is again noted. Foci of gas are noted within mildly prominent right upper pole renal calyces (series 2, images 31 and 33). No definite evidence of air in the parenchyma. The bladder is partially distended with small foci of gas noted in the nondependent aspect. No bladder calculi identified. Stomach/Bowel: Stomach is within normal limits. Appendix appears normal. No evidence of bowel wall thickening, distention, or inflammatory changes. Vascular/Lymphatic: No significant vascular findings are present. Stable subcentimeter aortocaval and para-aortic lymph nodes. Reproductive: Uterus and bilateral adnexa are unremarkable. Other: No abdominal wall hernia or abnormality. No abdominopelvic ascites. No intraperitoneal free air. Musculoskeletal: No acute or significant osseous findings. IMPRESSION: 1. Foci of gas noted within mildly prominent right upper pole renal collecting system and small foci of gas noted in the nondependent aspect of the bladder. 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 Evaluation for pyelonephritis is limited due to lack of intravenous contrast. 2. 2 mm nonobstructive calculus in the inferior pole of the right kidney. 3. Cholelithiasis. 4. Additional unchanged ancillary findings, as described above. Electronically Signed   By: Hart Robinsons M.D.   On: 11/23/2023 17:08    EKG: I independently viewed the EKG done and my findings are as followed: EKG was not done in the ED  Assessment/Plan Present on Admission:  UTI (urinary tract infection)  Essential hypertension  Mixed hyperlipidemia  Principal Problem:   UTI (urinary tract infection) Active Problems:   Morbid obesity with BMI of 50.0-59.9, adult (HCC)   Mixed hyperlipidemia   Essential hypertension   Type 2 diabetes mellitus with hyperglycemia (HCC)  UTI POA Continue IV ceftriaxone Continue Tylenol as needed for pain Urine culture  pending  Essential hypertension Continue Azor  Mixed hyperlipidemia Continue Crestor  Type 2 diabetes mellitus with hyperglycemia Hemoglobin A1c on 11/09/2023 was 8.9 Continue ISS and hypoglycemia protocol Metformin, glipizide will be held at this time  Morbid obesity (BMI 59.17) Patient was counseled about the cardiovascular and metabolic risk of morbid obesity. Patient was counseled for diet control, exercise regimen and weight loss.   Tobacco abuse Patient has 15-pack-year smoking history She was counseled on tobacco abuse cessation Continue nicotine patch  DVT prophylaxis: Lovenox  Code Status: Full code  Family Communication: Sister and niece at bedside (all questions answered to satisfaction)  Consults: None  Severity of Illness: The appropriate patient status for this patient is INPATIENT. Inpatient status is judged to be reasonable and necessary in order to provide the required intensity of service to ensure the patient's safety. The patient's presenting symptoms, physical  exam findings, and initial radiographic and laboratory data in the context of their chronic comorbidities is felt to place them at high risk for further clinical deterioration. Furthermore, it is not anticipated that the patient will be medically stable for discharge from the hospital within 2 midnights of admission.   * I certify that at the point of admission it is my clinical judgment that the patient will require inpatient hospital care spanning beyond 2 midnights from the point of admission due to high intensity of service, high risk for further deterioration and high frequency of surveillance required.*  Author: Frankey Shown, DO 11/23/2023 8:20 PM  For on call review [email or web address].

## 2023-11-23 NOTE — Progress Notes (Signed)
Patient arrived to room from ED. Alert and oriented x4. VSS, BP high. Patient stated that she had not had her new BP medication today. 0/10 pain. Patient ambulatory. Resting in bed.

## 2023-11-23 NOTE — Plan of Care (Signed)

## 2023-11-23 NOTE — Progress Notes (Signed)
PHARMACIST - PHYSICIAN COMMUNICATION  CONCERNING:  Enoxaparin (Lovenox) for DVT Prophylaxis    RECOMMENDATION: Patient was prescribed enoxaprin 40mg  q24 hours for VTE prophylaxis.   Filed Weights   11/23/23 1349  Weight: (!) 151.5 kg (334 lb)    Body mass index is 59.17 kg/m.  Estimated Creatinine Clearance: 117.2 mL/min (A) (by C-G formula based on SCr of 1.01 mg/dL (H)).   Based on [facility] policy patient is candidate for enoxaparin 0.5mg /kg TBW SQ every 24 hours based on BMI being >30.  DESCRIPTION: Pharmacy has adjusted enoxaparin dose per [facility] policy.  Patient is now receiving enoxaparin 75 mg every 24 hours    Foye Deer, PharmD Clinical Pharmacist  11/23/2023 8:10 PM

## 2023-11-23 NOTE — ED Notes (Signed)
Per PA IV ABX not PO Pt will need to be admitted Pt moved back out to the lobby Waiting on room

## 2023-11-24 ENCOUNTER — Encounter: Payer: Self-pay | Admitting: Family Medicine

## 2023-11-24 DIAGNOSIS — I10 Essential (primary) hypertension: Secondary | ICD-10-CM | POA: Diagnosis not present

## 2023-11-24 DIAGNOSIS — N390 Urinary tract infection, site not specified: Secondary | ICD-10-CM | POA: Diagnosis not present

## 2023-11-24 DIAGNOSIS — N12 Tubulo-interstitial nephritis, not specified as acute or chronic: Secondary | ICD-10-CM | POA: Diagnosis not present

## 2023-11-24 DIAGNOSIS — E1165 Type 2 diabetes mellitus with hyperglycemia: Secondary | ICD-10-CM | POA: Diagnosis not present

## 2023-11-24 DIAGNOSIS — Z720 Tobacco use: Secondary | ICD-10-CM | POA: Diagnosis not present

## 2023-11-24 DIAGNOSIS — E782 Mixed hyperlipidemia: Secondary | ICD-10-CM | POA: Diagnosis not present

## 2023-11-24 DIAGNOSIS — Z6841 Body Mass Index (BMI) 40.0 and over, adult: Secondary | ICD-10-CM | POA: Diagnosis not present

## 2023-11-24 LAB — CBC
HCT: 42.7 % (ref 36.0–46.0)
Hemoglobin: 13.8 g/dL (ref 12.0–15.0)
MCH: 28.3 pg (ref 26.0–34.0)
MCHC: 32.3 g/dL (ref 30.0–36.0)
MCV: 87.5 fL (ref 80.0–100.0)
Platelets: 375 10*3/uL (ref 150–400)
RBC: 4.88 MIL/uL (ref 3.87–5.11)
RDW: 13.5 % (ref 11.5–15.5)
WBC: 11.5 10*3/uL — ABNORMAL HIGH (ref 4.0–10.5)
nRBC: 0 % (ref 0.0–0.2)

## 2023-11-24 LAB — COMPREHENSIVE METABOLIC PANEL
ALT: 19 U/L (ref 0–44)
AST: 16 U/L (ref 15–41)
Albumin: 3.2 g/dL — ABNORMAL LOW (ref 3.5–5.0)
Alkaline Phosphatase: 72 U/L (ref 38–126)
Anion gap: 8 (ref 5–15)
BUN: 14 mg/dL (ref 6–20)
CO2: 25 mmol/L (ref 22–32)
Calcium: 8.4 mg/dL — ABNORMAL LOW (ref 8.9–10.3)
Chloride: 104 mmol/L (ref 98–111)
Creatinine, Ser: 0.77 mg/dL (ref 0.44–1.00)
GFR, Estimated: >60 mL/min (ref >60)
Glucose, Bld: 252 mg/dL — ABNORMAL HIGH (ref 70–99)
Potassium: 4.2 mmol/L (ref 3.5–5.1)
Sodium: 137 mmol/L (ref 135–145)
Total Bilirubin: 0.4 mg/dL (ref <1.2)
Total Protein: 7 g/dL (ref 6.5–8.1)

## 2023-11-24 LAB — GLUCOSE, CAPILLARY
Glucose-Capillary: 236 mg/dL — ABNORMAL HIGH (ref 70–99)
Glucose-Capillary: 259 mg/dL — ABNORMAL HIGH (ref 70–99)
Glucose-Capillary: 225 mg/dL — ABNORMAL HIGH (ref 70–99)
Glucose-Capillary: 282 mg/dL — ABNORMAL HIGH (ref 70–99)

## 2023-11-24 MED ORDER — SODIUM CHLORIDE 0.9 % IV SOLN
1.0000 g | INTRAVENOUS | Status: DC
  Administered 2023-11-24: 1 g via INTRAVENOUS
  Filled 2023-11-24: qty 10

## 2023-11-24 MED ORDER — SODIUM CHLORIDE 0.9 % IV SOLN: 2.0000 g | INTRAVENOUS | Status: DC

## 2023-11-24 MED ORDER — ENOXAPARIN SODIUM 60 MG/0.6ML IJ SOSY: 60.0000 mg | PREFILLED_SYRINGE | INTRAMUSCULAR | Status: DC

## 2023-11-24 NOTE — Progress Notes (Signed)
   11/24/23 1533  TOC Brief Assessment  Insurance and Status Reviewed (Medicaid)  Patient has primary care physician Yes  Home environment has been reviewed From home  Prior level of function: Independent  Prior/Current Home Services No current home services  Social Drivers of Health Review SDOH reviewed no interventions necessary  Transition of care needs no transition of care needs at this time     Transition of Care Department [facility]) has reviewed patient and no TOC needs have been identified at this time. We will continue to monitor patient advancement through interdisciplinary progression rounds. If new patient transition needs arise, please place a TOC consult.

## 2023-11-24 NOTE — Progress Notes (Signed)
Progress Note   Patient: Selena Barr JXB:[id number] DOB: 1991/02/09 DOA: 11/23/2023     1 DOS: the patient was seen and examined on 11/24/2023   Brief hospital admission course: As per H&P written by Dr. Thomes Dinning on 11/23/2023 [name] is a 32 y.o. female with medical history significant of hypertension, T2DM, hyperlipidemia, atrophic left kidney who presents to the emergency department due to right flank and right lower quadrant pain which was sharp in nature.  Pain was intermittent with no known aggravating/alleviating factors.  This was associated with nausea, urinary frequency and painful urination.  She denies fever, chills, chest pain or shortness of breath.   ED Course:  In the emergency department, BP was 154/94, other vital signs were within normal range.  Workup in the ED showed normal CBC except for thrombocytosis.  BMP was normal except for hyperglycemia.  Urinalysis was positive for UTI.  Lipase 44, pregnancy test was negative. CT abdomen and pelvis without contrast showed foci of gas noted within mildly prominent right upper pole renal collecting system and small foci of gas noted in the nondependent aspect of the bladder.  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  Evaluation for pyelonephritis is limited due to lack of intravenous contrast.  2 mm nonobstructive calculus in the inferior pole of the right kidney.  Cholelithiasis. Urologist on-call (Dr. Liliane Shi) was consulted and he advised there was no need for any procedures and the patient will require IV antibiotics. Patient was started on IV ceftriaxone.  IV Toradol 15 mg x 1 and Zofran 4 mg x 1 was given.  Hospitalist was asked admit patient for further evaluation and management.  Assessment and Plan: 1-UTI/pyelonephritis -Continue supportive care -Patient advised to maintain adequate hydration -Follow culture results -Continue IV antibiotics  2-essential hypertension -Continue sore  3-mixed  hyperlipidemia -Continue statin.  4-type 2 diabetes with hyperglycemia -Holding oral hypoglycemic agents while inpatient -Continue sliding scale insulin -Modified carbohydrate diet discussed with patient -Recent A1c 8.9.  5-morbid obesity -Low-calorie diet, portion control and increase physical activity discussed with patient -Body mass index is 60.36 kg/m.   6-tobacco abuse -Cessation counseling provided.    Subjective:  Afebrile, no chest pain, expressing no nausea or vomiting.  Overall feeling better and expressing noticing reassuring in her urine output and urine appearance.  Physical Exam: Vitals:   11/23/23 2000 11/23/23 2031 11/23/23 2306 11/24/23 0406  BP: (!) 167/106 (!) 142/124 (!) 150/97 (!) 159/93  Pulse: 84 92 87 100  Resp: 18 18 18 18   Temp: 98 F (36.7 C) 98.2 F (36.8 C) 97.7 F (36.5 C) 98.5 F (36.9 C)  TempSrc: Oral Oral Oral Oral  SpO2: 100% 99% 99% 99%  Weight:  (!) 149.7 kg    Height:  5\' 2"  (1.575 m)     General exam: Alert, awake, oriented x 3; in no acute distress.  No overnight events. Respiratory system: Clear to auscultation. Respiratory effort normal.  Good saturation on room air. Cardiovascular system:RRR. No rubs or gallops; unable to properly assess JVD with body habitus. Gastrointestinal system: Abdomen is obese, nondistended, soft and nontender. No organomegaly or masses felt. Normal bowel sounds heard. Central nervous system: No focal neurological deficits. Extremities: No cyanosis or clubbing. Skin: No petechiae. Psychiatry: Judgement and insight appear normal. Mood & affect appropriate.   Data Reviewed: CBC: WBCs 11.5, hemoglobin 13.8 and platelet count 375K Comprehensive metabolic panel: Sodium 137, potassium 4.2, chloride 104, bicarb 25, glucose 252, BUN 14, creatinine 0.77, normal LFTs  and GFR >60  Family Communication: Sister at bedside.  Disposition: Status is: Inpatient Remains inpatient appropriate because: Continue IV  antibiotics.   Planned Discharge Destination: Home  Time spent: 50 minutes  Author: Vassie Loll, MD 11/24/2023 5:02 PM  For on call review [email or web address].

## 2023-11-25 DIAGNOSIS — I10 Essential (primary) hypertension: Secondary | ICD-10-CM | POA: Diagnosis not present

## 2023-11-25 DIAGNOSIS — N12 Tubulo-interstitial nephritis, not specified as acute or chronic: Secondary | ICD-10-CM | POA: Diagnosis not present

## 2023-11-25 DIAGNOSIS — E1165 Type 2 diabetes mellitus with hyperglycemia: Secondary | ICD-10-CM | POA: Diagnosis not present

## 2023-11-25 DIAGNOSIS — Z6841 Body Mass Index (BMI) 40.0 and over, adult: Secondary | ICD-10-CM | POA: Diagnosis not present

## 2023-11-25 DIAGNOSIS — Z720 Tobacco use: Secondary | ICD-10-CM | POA: Diagnosis not present

## 2023-11-25 DIAGNOSIS — E782 Mixed hyperlipidemia: Secondary | ICD-10-CM | POA: Diagnosis not present

## 2023-11-25 DIAGNOSIS — N390 Urinary tract infection, site not specified: Secondary | ICD-10-CM | POA: Diagnosis not present

## 2023-11-25 LAB — URINE CULTURE

## 2023-11-25 LAB — GLUCOSE, CAPILLARY: Glucose-Capillary: 212 mg/dL — ABNORMAL HIGH (ref 70–99)

## 2023-11-25 MED ORDER — ACETAMINOPHEN 500 MG PO TABS: 1000.0000 mg | ORAL_TABLET | Freq: Three times a day (TID) | ORAL | Status: DC | PRN

## 2023-11-25 MED ORDER — NICOTINE 21 MG/24HR TD PT24: 21.0000 mg | MEDICATED_PATCH | Freq: Every day | TRANSDERMAL | 0 refills | Status: DC

## 2023-11-25 MED ORDER — CEFADROXIL 500 MG PO CAPS: 500.0000 mg | ORAL_CAPSULE | Freq: Two times a day (BID) | ORAL | 0 refills | Status: AC

## 2023-11-25 NOTE — Discharge Summary (Signed)
Physician Discharge Summary   Patient: Selena Barr MRN: [id number] DOB: October 30, 1991  Admit date:     11/23/2023  Discharge date: 11/25/23  Discharge Physician: Selena Barr   PCP: Selena Laroche, FNP   Recommendations at discharge:  Repeat basic metabolic panel to follow cholesterol function Continue assisting patient with weight loss management Close monitoring to patient's CBGs/A1c with further hypoglycemic regimen as required Reassess blood pressure and adjust antihypertensive treatment as needed. Follow final urine culture results and if needed make any adjustment to the antibiotic the patient has been discharged home. Continue assisting patient with a smoking cessation.  Discharge Diagnoses: Principal Problem:   UTI (urinary tract infection) Active Problems:   Morbid obesity with BMI of 50.0-59.9, adult (HCC)   Tobacco abuse   Mixed hyperlipidemia   Essential hypertension   Type 2 diabetes mellitus with hyperglycemia (HCC)   Emphysematous pyelitis   Brief hospital admission course: As per H&P written by Dr. Thomes Barr on 11/23/2023 Selena Barr is a 32 y.o. female with medical history significant of hypertension, T2DM, hyperlipidemia, atrophic left kidney who presents to the emergency department due to right flank and right lower quadrant pain which was sharp in nature.  Pain was intermittent with no known aggravating/alleviating factors.  This was associated with nausea, urinary frequency and painful urination.  She denies fever, chills, chest pain or shortness of breath.   ED Course:  In the emergency department, BP was 154/94, other vital signs were within normal range.  Workup in the ED showed normal CBC except for thrombocytosis.  BMP was normal except for hyperglycemia.  Urinalysis was positive for UTI.  Lipase 44, pregnancy test was negative. CT abdomen and pelvis without contrast showed foci of gas noted within mildly prominent right upper pole renal collecting  system and small foci of gas noted in the nondependent aspect of the bladder.  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  Evaluation for pyelonephritis is limited due to lack of intravenous contrast.  2 mm nonobstructive calculus in the inferior pole of the right kidney.  Cholelithiasis. Urologist on-call (Dr. Liliane Barr) was consulted and he advised there was no need for any procedures and the patient will require IV antibiotics. Patient was started on IV ceftriaxone.  IV Toradol 15 mg x 1 and Zofran 4 mg x 1 was given.  Hospitalist was asked admit patient for further evaluation and management.  Assessment and Plan: 1-UTI/pyelonephritis -Patient advised to maintain adequate hydration -No nausea, vomiting or dysuria at time of discharge -Afebrile and in no acute distress; antibiotics transition to oral route with intention to complete 7 more days.  2-essential hypertension -Resume home antihypertensive agent -Healthy diet discussed with patient  3-mixed hyperlipidemia -Continue statin.  4-type 2 diabetes mellitus with hyperglycemia -Recent A1c 8.9 -Modified carbohydrate diet discussed with patient -Instructed to maintain adequate hydration and will resume home hypoglycemic regimen.  5-morbid obesity -Body mass index is 60.36 kg/m.  -Low-calorie diet, portion control and increase physical activity discussed with patient.  6-tobacco abuse -Cessation counseling provided. -Nicotine patch declined while inpatient.  Consultants: Urology service curbside. Procedures performed: See below for x-ray reports Disposition: Home Diet recommendation: Heart healthy/modified carbohydrate diet.  DISCHARGE MEDICATION: Allergies as of 11/25/2023       Reactions   Solu-medrol [methylprednisolone Sodium Succ]    Rash, lip swelling        Medication List     TAKE these medications    acetaminophen 500 MG tablet Commonly known as: TYLENOL Take 2  tablets (1,000 mg total) by  mouth every 8 (eight) hours as needed for mild pain (pain score 1-3), fever or headache (or Fever >/= 101).   amlodipine-olmesartan 10-20 MG tablet Commonly known as: AZOR Take 1 tablet by mouth daily.   cefadroxil 500 MG capsule Commonly known as: DURICEF Take 1 capsule (500 mg total) by mouth 2 (two) times daily for 7 days.   metFORMIN 1000 MG tablet Commonly known as: GLUCOPHAGE Take 1 tablet (1,000 mg total) by mouth 2 (two) times daily with a meal.   NEXPLANON [place] Inject into the skin.   nicotine 21 mg/24hr patch Commonly known as: NICODERM CQ - dosed in mg/24 hours Place 1 patch (21 mg total) onto the skin daily. Start taking on: November 26, 2023   Ozempic (0.25 or 0.5 MG/DOSE) 2 MG/3ML Sopn Generic drug: Semaglutide(0.25 or 0.5MG /DOS) Inject 0.25 mg into the skin once a week.   rosuvastatin 10 MG tablet Commonly known as: Crestor Take 1 tablet (10 mg total) by mouth daily.   Vitamin D (Ergocalciferol) 1.25 MG (50000 UNIT) Caps capsule Commonly known as: DRISDOL Take 1 capsule (50,000 Units total) by mouth every 7 (seven) days. What changed: when to take this        Follow-up Information     Selena Laroche, FNP. Schedule an appointment as soon as possible for a visit in 10 day(s).   Specialty: Family Medicine Contact information: [address] [place] [address] [place] [address] [phone]                Discharge Exam: Selena Barr Weights   11/23/23 1349 11/23/23 2031  Weight: (!) 151.5 kg (!) 149.7 kg   General exam: Alert, awake, oriented x 3; in no acute distress.  No overnight events. Respiratory system: Clear to auscultation. Respiratory effort normal.  Good saturation on room air. Cardiovascular system:RRR. No rubs or gallops; unable to properly assess JVD with body habitus. Gastrointestinal system: Abdomen is obese, nondistended, soft and nontender. No organomegaly or masses felt. Normal bowel sounds heard. Central nervous system: No focal neurological  deficits. Extremities: No cyanosis or clubbing. Skin: No petechiae. Psychiatry: Judgement and insight appear normal. Mood & affect appropriate.   Condition at discharge: Stable and improved.  The results of significant diagnostics from this hospitalization (including imaging, microbiology, ancillary and laboratory) are listed below for reference.   Imaging Studies: CT Renal Stone Study Result Date: 11/23/2023 CLINICAL DATA:  Abdominal/flank pain, stone suspected EXAM: CT ABDOMEN AND PELVIS WITHOUT CONTRAST TECHNIQUE: Multidetector CT imaging of the abdomen and pelvis was performed following the standard protocol without IV contrast. RADIATION DOSE REDUCTION: This exam was performed according to the departmental dose-optimization program which includes automated exposure control, adjustment of the mA and/or kV according to patient size and/or use of iterative reconstruction technique. COMPARISON:  CT abdomen/pelvis dated August 31, 2023. FINDINGS: Lower chest: No acute abnormality. Hepatobiliary: No focal liver abnormality is seen, within the limits of an unenhanced exam. Cholelithiasis. No definite gallbladder wall thickening or pericholecystic fluid. No biliary dilatation. Pancreas: Unremarkable. No pancreatic ductal dilatation or surrounding inflammatory changes. Spleen: Normal in size without focal abnormality. Adrenals/Urinary Tract: The adrenal glands are unremarkable. Marked atrophy of the left kidney with unchanged 1.5 cm simple left renal cyst. Compensatory hypertrophy of the right kidney is again noted. Foci of gas are noted within mildly prominent right upper pole renal calyces (series 2, images 31 and 33). No definite evidence of air in the parenchyma. The bladder is partially distended with  small foci of gas noted in the nondependent aspect. No bladder calculi identified. Stomach/Bowel: Stomach is within normal limits. Appendix appears normal. No evidence of bowel wall thickening,  distention, or inflammatory changes. Vascular/Lymphatic: No significant vascular findings are present. Stable subcentimeter aortocaval and para-aortic lymph nodes. Reproductive: Uterus and bilateral adnexa are unremarkable. Other: No abdominal wall hernia or abnormality. No abdominopelvic ascites. No intraperitoneal free air. Musculoskeletal: No acute or significant osseous findings. IMPRESSION: 1. Foci of gas noted within mildly prominent right upper pole renal collecting system and small foci of gas noted in the nondependent aspect of the bladder. 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 Evaluation for pyelonephritis is limited due to lack of intravenous contrast. 2. 2 mm nonobstructive calculus in the inferior pole of the right kidney. 3. Cholelithiasis. 4. Additional unchanged ancillary findings, as described above. Electronically Signed   By: Hart Robinsons M.D.   On: 11/23/2023 17:08    Microbiology: Results for orders placed or performed during the hospital encounter of 11/23/23  Urine Culture     Status: Abnormal   Collection Time: 11/23/23  1:51 PM   Specimen: Urine, Clean Catch  Result Value Ref Range Status   Specimen Description   Final    URINE, CLEAN CATCH Performed at [facility], [address], [place], [place] [address]    Special Requests   Final    NONE Performed at [facility], [address] [place], [place] [address]    Culture MULTIPLE SPECIES PRESENT, SUGGEST RECOLLECTION (A)  Final   Report Status 11/25/2023 FINAL  Final    Labs: CBC: Recent Labs  Lab 11/23/23 1422 11/24/23 0619  WBC 10.9* 11.5*  NEUTROABS 7.9*  --   HGB 14.8 13.8  HCT 44.9 42.7  MCV 86.3 87.5  PLT 403* 375   Basic Metabolic Panel: Recent Labs  Lab 11/23/23 1422 11/24/23 0619  NA 136 137  K 3.7 4.2  CL 100 104  CO2 28 25  GLUCOSE 267* 252*  BUN 13 14  CREATININE 1.01* 0.77  CALCIUM 8.6* 8.4*   Liver Function Tests: Recent Labs  Lab 11/23/23 1422  11/24/23 0619  AST 17 16  ALT 22 19  ALKPHOS 77 72  BILITOT 0.5 0.4  PROT 7.7 7.0  ALBUMIN 3.6 3.2*   CBG: Recent Labs  Lab 11/24/23 0747 11/24/23 1126 11/24/23 1642 11/24/23 2033 11/25/23 0721  GLUCAP 236* 259* 225* 282* 212*    Discharge time spent: greater than 30 minutes.  Signed: Vassie Loll, MD [facility] 11/25/2023

## 2023-11-25 NOTE — Plan of Care (Signed)
  Problem: Safety: Goal: Ability to remain free from injury will improve Outcome: Progressing   Problem: Nutrition: Goal: Adequate nutrition will be maintained Outcome: Not Met (add Reason)

## 2023-11-26 ENCOUNTER — Other Ambulatory Visit: Payer: Self-pay

## 2023-11-26 DIAGNOSIS — N200 Calculus of kidney: Secondary | ICD-10-CM

## 2023-11-26 NOTE — Telephone Encounter (Signed)
Hey order urine culture and urinalysis

## 2023-11-26 NOTE — Telephone Encounter (Signed)
Both ordered and P.t has been contacted to come in and provide sample. She was just dismissed from the hospital. And needs a 10 day follow up. I told her to come in for the sample and follow up w/ Malachi Bonds so we would have a better understanding of what she needs at her f/u.

## 2023-11-27 LAB — URINALYSIS
Bilirubin, UA: NEGATIVE
Ketones, UA: NEGATIVE
Nitrite, UA: NEGATIVE
Protein,UA: NEGATIVE
Specific Gravity, UA: 1.02 (ref 1.005–1.030)
Urobilinogen, Ur: 0.2 mg/dL (ref 0.2–1.0)
pH, UA: 7 (ref 5.0–7.5)

## 2023-11-28 LAB — URINE CULTURE: Organism ID, Bacteria: NO GROWTH

## 2023-11-29 ENCOUNTER — Encounter: Payer: Self-pay | Admitting: Family Medicine

## 2023-12-03 NOTE — Progress Notes (Signed)
 Established Patient Office Visit   Subjective  Patient ID: Selena Barr, female    DOB: May 03, 1991  Age: 32 y.o. MRN: [id number]  Chief Complaint  Patient presents with   Hospitalization Follow-up    She  has a past medical history of Allergy , Diabetes mellitus without complication (HCC), Hyperlipidemia (10/24/2016), Hypertension, and Tobacco abuse (09/22/2016).  Patient presents to the clinic for  hospital follow up. Patient presented to the ED on 11/23/23 with right flank and lower quadrant pain, nausea, urinary frequency, and dysuria. Workup revealed a UTI with CT findings concerning for emphysematous pyelitis, treated with IV ceftriaxone . She was discharged in stable condition with oral antibiotics and instructed to maintain hydration. CT revealed foci of gas in the right renal collecting system and bladder, suggestive of emphysematous pyelitis. Limited evaluation for pyelonephritis due to lack of IV contrast. Findings also include a 2 mm nonobstructive kidney stone, cholelithiasis,    Review of Systems  Constitutional:  Negative for chills and fever.  Respiratory:  Negative for shortness of breath.   Cardiovascular:  Negative for chest pain.  Genitourinary:  Negative for dysuria, flank pain, frequency, hematuria and urgency.      Objective:     BP 128/83   Pulse 90   Ht 5' 2 (1.575 m)   Wt (!) 329 lb 12 oz (149.6 kg)   SpO2 96%   BMI 60.31 kg/m  BP Readings from Last 3 Encounters:  12/06/23 128/83  11/25/23 (!) 141/99  11/08/23 (!) 152/86      Physical Exam Vitals reviewed.  Constitutional:      General: She is not in acute distress.    Appearance: Normal appearance. She is not ill-appearing, toxic-appearing or diaphoretic.  HENT:     Head: Normocephalic.  Eyes:     General:        Right eye: No discharge.        Left eye: No discharge.     Conjunctiva/sclera: Conjunctivae normal.  Cardiovascular:     Rate and Rhythm: Normal rate.     Pulses: Normal  pulses.     Heart sounds: Normal heart sounds.  Pulmonary:     Effort: Pulmonary effort is normal. No respiratory distress.     Breath sounds: Normal breath sounds.  Abdominal:     General: Bowel sounds are normal.     Palpations: Abdomen is soft.     Tenderness: There is no abdominal tenderness. There is no right CVA tenderness, left CVA tenderness or guarding.  Skin:    General: Skin is warm and dry.     Capillary Refill: Capillary refill takes less than 2 seconds.  Neurological:     Mental Status: She is alert.  Psychiatric:        Mood and Affect: Mood normal.        Behavior: Behavior normal.      No results found for any visits on 12/06/23.  The ASCVD Risk score (Arnett DK, et al., 2019) failed to calculate for the following reasons:   The 2019 ASCVD risk score is only valid for ages 40 to 22    Assessment & Plan:  Hospital discharge follow-up Assessment & Plan: Recent Urine culture negative, Patient reports recovering well with no acute episodes. Urinalysis, BMP and CBC labs ordered. The hospital chart, including the discharge summary, was thoroughly reviewed Medications were thoroughly reviewed and reconciled with the patient.  Advise on drinking plenty of water 2L a day. Make sure to urinate frequently and  avoid holding your urine. After using the bathroom, always wipe from front to back to prevent bacteria from spreading. Avoid irritants like caffeine , alcohol, spicy foods, and artificial sweeteners, as they can aggravate your bladder. Wearing loose, breathable clothing, especially cotton underwear, can help keep the area dry and reduce bacterial growth.   Orders: -     CBC with Differential/Platelet -     BMP8+eGFR -     Urinalysis  Other orders -     Blood Glucose Monitoring Suppl; 1 each by Does not apply route in the morning, at noon, and at bedtime. May substitute to any manufacturer covered by patient's insurance.  Dispense: 1 each; Refill: 1 -     Blood  Glucose Test; 1 each by In Vitro route 2 (two) times daily. May substitute to any manufacturer covered by patient's insurance.  Dispense: 180 each; Refill: 3 -     Lancet Device; 1 each by Does not apply route 2 (two) times daily. May substitute to any manufacturer covered by patient's insurance.  Dispense: 1 each; Refill: 3 -     Lancets Misc.; 1 each by Does not apply route 2 (two) times daily. May substitute to any manufacturer covered by patient's insurance.  Dispense: 100 each; Refill: 3 -     Blood Pressure; Take blood pressure once daily  Dispense: 1 kit; Refill: 0    Return if symptoms worsen or fail to improve.   Hilario Kidd Wilhelmena Falter, FNP

## 2023-12-03 NOTE — Patient Instructions (Signed)

## 2023-12-06 ENCOUNTER — Ambulatory Visit: Payer: Medicaid Other | Admitting: Family Medicine

## 2023-12-06 VITALS — BP 128/83 | HR 90 | Ht 62.0 in | Wt 329.8 lb

## 2023-12-06 DIAGNOSIS — Z09 Encounter for follow-up examination after completed treatment for conditions other than malignant neoplasm: Secondary | ICD-10-CM | POA: Diagnosis not present

## 2023-12-06 DIAGNOSIS — N390 Urinary tract infection, site not specified: Secondary | ICD-10-CM | POA: Diagnosis not present

## 2023-12-06 MED ORDER — BLOOD PRESSURE KIT: PACK | 0 refills | Status: DC

## 2023-12-06 MED ORDER — BLOOD GLUCOSE TEST VI STRP: 1.0000 | ORAL_STRIP | Freq: Two times a day (BID) | 3 refills | Status: AC

## 2023-12-06 MED ORDER — LANCET DEVICE MISC: 1.0000 | Freq: Two times a day (BID) | 3 refills | Status: AC

## 2023-12-06 MED ORDER — BLOOD GLUCOSE MONITORING SUPPL DEVI: 1.0000 | Freq: Three times a day (TID) | 1 refills | Status: AC

## 2023-12-06 MED ORDER — LANCETS MISC. MISC: 1.0000 | Freq: Two times a day (BID) | 3 refills | Status: AC

## 2023-12-06 NOTE — Assessment & Plan Note (Signed)
 Recent Urine culture negative, Patient reports recovering well with no acute episodes. Urinalysis, BMP and CBC labs ordered. The hospital chart, including the discharge summary, was thoroughly reviewed Medications were thoroughly reviewed and reconciled with the patient.  Advise on drinking plenty of water 2L a day. Make sure to urinate frequently and avoid holding your urine. After using the bathroom, always wipe from front to back to prevent bacteria from spreading. Avoid irritants like caffeine , alcohol, spicy foods, and artificial sweeteners, as they can aggravate your bladder. Wearing loose, breathable clothing, especially cotton underwear, can help keep the area dry and reduce bacterial growth.

## 2023-12-06 NOTE — Addendum Note (Signed)
 Addended by: Abner Greenspan on: 12/06/2023 02:10 PM   Modules accepted: Orders

## 2023-12-07 LAB — CBC WITH DIFFERENTIAL/PLATELET
Basophils Absolute: 0.1 10*3/uL (ref 0.0–0.2)
Basos: 1 %
EOS (ABSOLUTE): 0.1 10*3/uL (ref 0.0–0.4)
Eos: 1 %
Hematocrit: 43.7 % (ref 34.0–46.6)
Hemoglobin: 14.4 g/dL (ref 11.1–15.9)
Immature Grans (Abs): 0 10*3/uL (ref 0.0–0.1)
Immature Granulocytes: 0 %
Lymphocytes Absolute: 2.3 10*3/uL (ref 0.7–3.1)
Lymphs: 23 %
MCH: 28.8 pg (ref 26.6–33.0)
MCHC: 33 g/dL (ref 31.5–35.7)
MCV: 87 fL (ref 79–97)
Monocytes Absolute: 0.5 10*3/uL (ref 0.1–0.9)
Monocytes: 5 %
Neutrophils Absolute: 7.4 10*3/uL — ABNORMAL HIGH (ref 1.4–7.0)
Neutrophils: 70 %
Platelets: 442 10*3/uL (ref 150–450)
RBC: 5 x10E6/uL (ref 3.77–5.28)
RDW: 12.7 % (ref 11.7–15.4)
WBC: 10.4 10*3/uL (ref 3.4–10.8)

## 2023-12-07 LAB — BMP8+EGFR
BUN/Creatinine Ratio: 19 (ref 9–23)
BUN: 12 mg/dL (ref 6–20)
CO2: 24 mmol/L (ref 20–29)
Calcium: 9.2 mg/dL (ref 8.7–10.2)
Chloride: 101 mmol/L (ref 96–106)
Creatinine, Ser: 0.64 mg/dL (ref 0.57–1.00)
Glucose: 206 mg/dL — ABNORMAL HIGH (ref 70–99)
Potassium: 4.7 mmol/L (ref 3.5–5.2)
Sodium: 139 mmol/L (ref 134–144)
eGFR: 121 mL/min/{1.73_m2} (ref >59)

## 2023-12-08 LAB — URINALYSIS
Bilirubin, UA: NEGATIVE
Glucose, UA: NEGATIVE
Ketones, UA: NEGATIVE
Nitrite, UA: NEGATIVE
Protein,UA: NEGATIVE
RBC, UA: NEGATIVE
Specific Gravity, UA: 1.015 (ref 1.005–1.030)
Urobilinogen, Ur: 1 mg/dL (ref 0.2–1.0)
pH, UA: 7 (ref 5.0–7.5)

## 2023-12-23 DIAGNOSIS — R030 Elevated blood-pressure reading, without diagnosis of hypertension: Secondary | ICD-10-CM | POA: Diagnosis not present

## 2023-12-23 DIAGNOSIS — S46911A Strain of unspecified muscle, fascia and tendon at shoulder and upper arm level, right arm, initial encounter: Secondary | ICD-10-CM | POA: Diagnosis not present

## 2023-12-23 DIAGNOSIS — Z6841 Body Mass Index (BMI) 40.0 and over, adult: Secondary | ICD-10-CM | POA: Diagnosis not present

## 2023-12-27 DIAGNOSIS — U071 COVID-19: Secondary | ICD-10-CM | POA: Diagnosis not present

## 2023-12-28 ENCOUNTER — Telehealth (INDEPENDENT_AMBULATORY_CARE_PROVIDER_SITE_OTHER): Payer: Medicaid Other | Admitting: Family Medicine

## 2023-12-28 VITALS — BP 193/89 | HR 84 | Temp 101.2°F | Ht 63.0 in | Wt 330.0 lb

## 2023-12-28 DIAGNOSIS — U071 COVID-19: Secondary | ICD-10-CM | POA: Diagnosis not present

## 2023-12-28 MED ORDER — PROMETHAZINE-DM 6.25-15 MG/5ML PO SYRP: 5.0000 mL | ORAL_SOLUTION | Freq: Four times a day (QID) | ORAL | 0 refills | Status: DC | PRN

## 2023-12-28 MED ORDER — LEVOCETIRIZINE DIHYDROCHLORIDE 5 MG PO TABS: 5.0000 mg | ORAL_TABLET | Freq: Every evening | ORAL | 1 refills | Status: DC

## 2023-12-28 MED ORDER — NIRMATRELVIR/RITONAVIR (PAXLOVID)TABLET: 3.0000 | ORAL_TABLET | Freq: Two times a day (BID) | ORAL | 0 refills | Status: AC

## 2023-12-28 MED ORDER — ALBUTEROL SULFATE HFA 108 (90 BASE) MCG/ACT IN AERS: 2.0000 | INHALATION_SPRAY | Freq: Four times a day (QID) | RESPIRATORY_TRACT | 2 refills | Status: DC | PRN

## 2023-12-28 NOTE — Assessment & Plan Note (Addendum)
Paxlovid x 5 days and Albuterol PRN for Sob Promethazine syrup PRN, Xyzal 5 mg at bedtime. Advise patient to rest to support your body's recovery. Stay hydrated by drinking water, tea, or broth. Using a humidifier can help soothe throat irritation and ease nasal congestion. For fever or pain, acetaminophen (Tylenol) is recommended. To relieve other symptoms, try saline nasal sprays, throat lozenges, or gargling with saltwater. Focus on eating light, healthy meals like fruits and vegetables to keep your strength up. Practice good hygiene by washing your hands frequently and covering your mouth when coughing or sneezing.Follow-up for worsening or persistent symptoms. Patient verbalizes understanding regarding plan of care and all questions answered

## 2023-12-28 NOTE — Progress Notes (Signed)
Virtual Visit via Video Note  I connected with Selena Barr on 12/28/23 at 11:20 AM EST by a video enabled telemedicine application and verified that I am speaking with the correct person using two identifiers.  Patient Location: Home Provider Location: Office/Clinic  I discussed the limitations, risks, security, and privacy concerns of performing an evaluation and management service by video and the availability of in person appointments. I also discussed with the patient that there may be a patient responsible charge related to this service. The patient expressed understanding and agreed to proceed.  Subjective: PCP: Gilmore Laroche, FNP  Chief Complaint  Patient presents with   Acute Visit    Runny nose, congestion , bad cough w/ dark green mucus drainage. . X 2 days,    Patient complains of  persistent dry cough. At home COVID test positive.  Patient describes symptoms of shortness of breath on exertion, cough, fatigue, headache, malaise, myalgias, sore throat, sweats, and wheezing. Symptoms began 1 day ago and are rapidly worsening since that time. Patient denies nausea and vomiting. Treatment thus far includes OTC analgesics/antipyretics: somewhat effective Past pulmonary history is significant for no history of pneumonia or bronchitis        ROS: Per HPI  Current Outpatient Medications:    Accu-Chek Softclix Lancets lancets, 1 each by Other route 3 (three) times daily., Disp: , Rfl:    albuterol (VENTOLIN HFA) 108 (90 Base) MCG/ACT inhaler, Inhale 2 puffs into the lungs every 6 (six) hours as needed for wheezing or shortness of breath., Disp: 8 g, Rfl: 2   amlodipine-olmesartan (AZOR) 10-20 MG tablet, Take 1 tablet by mouth daily., Disp: 30 tablet, Rfl: 1   Blood Glucose Monitoring Suppl DEVI, 1 each by Does not apply route in the morning, at noon, and at bedtime. May substitute to any manufacturer covered by patient's insurance., Disp: 1 each, Rfl: 1   Blood Pressure  KIT, Take blood pressure once daily, Disp: 1 kit, Rfl: 0   Etonogestrel (NEXPLANON [place]), Inject into the skin., Disp: , Rfl:    Glucose Blood (BLOOD GLUCOSE TEST STRIPS) STRP, 1 each by In Vitro route 2 (two) times daily. May substitute to any manufacturer covered by patient's insurance., Disp: 180 each, Rfl: 3   Lancet Device MISC, 1 each by Does not apply route 2 (two) times daily. May substitute to any manufacturer covered by patient's insurance., Disp: 1 each, Rfl: 3   Lancets Misc. MISC, 1 each by Does not apply route 2 (two) times daily. May substitute to any manufacturer covered by patient's insurance., Disp: 100 each, Rfl: 3   levocetirizine (XYZAL) 5 MG tablet, Take 1 tablet (5 mg total) by mouth every evening., Disp: 30 tablet, Rfl: 1   nirmatrelvir/ritonavir (PAXLOVID) 20 x 150 MG & 10 x 100MG  TABS, Take 3 tablets by mouth 2 (two) times daily for 5 days. (Take nirmatrelvir 150 mg two tablets twice daily for 5 days and ritonavir 100 mg one tablet twice daily for 5 days) Patient GFR is 121, Disp: 30 tablet, Rfl: 0   promethazine-dextromethorphan (PROMETHAZINE-DM) 6.25-15 MG/5ML syrup, Take 5 mLs by mouth 4 (four) times daily as needed., Disp: 118 mL, Rfl: 0   rosuvastatin (CRESTOR) 10 MG tablet, Take 1 tablet (10 mg total) by mouth daily., Disp: 90 tablet, Rfl: 3   Semaglutide,0.25 or 0.5MG /DOS, (OZEMPIC, 0.25 OR 0.5 MG/DOSE,) 2 MG/3ML SOPN, Inject 0.25 mg into the skin once a week., Disp: 3 mL, Rfl: 0   Vitamin D,  Ergocalciferol, (DRISDOL) 1.25 MG (50000 UNIT) CAPS capsule, Take 1 capsule (50,000 Units total) by mouth every 7 (seven) days. (Patient taking differently: Take 50,000 Units by mouth every Wednesday.), Disp: 20 capsule, Rfl: 1  Observations/Objective: Today's Vitals   12/28/23 1119  BP: (!) 193/89  Pulse: 84  Temp: (!) 101.2 F (38.4 C)  Weight: (!) 330 lb (149.7 kg)  Height: 5\' 3"  (1.6 m)  PainSc: 10-Worst pain ever  PainLoc: Generalized   Physical Exam Patient is alert  and no acute distress noted.   Assessment and Plan: COVID-19 Assessment & Plan: Paxlovid x 5 days and Albuterol PRN for Sob Promethazine syrup PRN, Xyzal 5 mg at bedtime. Advise patient to rest to support your body's recovery. Stay hydrated by drinking water, tea, or broth. Using a humidifier can help soothe throat irritation and ease nasal congestion. For fever or pain, acetaminophen (Tylenol) is recommended. To relieve other symptoms, try saline nasal sprays, throat lozenges, or gargling with saltwater. Focus on eating light, healthy meals like fruits and vegetables to keep your strength up. Practice good hygiene by washing your hands frequently and covering your mouth when coughing or sneezing.Follow-up for worsening or persistent symptoms. Patient verbalizes understanding regarding plan of care and all questions answered    Orders: -     nirmatrelvir/ritonavir; Take 3 tablets by mouth 2 (two) times daily for 5 days. (Take nirmatrelvir 150 mg two tablets twice daily for 5 days and ritonavir 100 mg one tablet twice daily for 5 days) Patient GFR is 121  Dispense: 30 tablet; Refill: 0 -     Albuterol Sulfate HFA; Inhale 2 puffs into the lungs every 6 (six) hours as needed for wheezing or shortness of breath.  Dispense: 8 g; Refill: 2 -     Promethazine-DM; Take 5 mLs by mouth 4 (four) times daily as needed.  Dispense: 118 mL; Refill: 0 -     Levocetirizine Dihydrochloride; Take 1 tablet (5 mg total) by mouth every evening.  Dispense: 30 tablet; Refill: 1    Follow Up Instructions: No follow-ups on file.   I discussed the assessment and treatment plan with the patient. The patient was provided an opportunity to ask questions, and all were answered. The patient agreed with the plan and demonstrated an understanding of the instructions.   The patient was advised to call back or seek an in-person evaluation if the symptoms worsen or if the condition fails to improve as anticipated.  The above  assessment and management plan was discussed with the patient. The patient verbalized understanding of and has agreed to the management plan.   Cruzita Lederer Newman Nip, FNP

## 2024-01-01 ENCOUNTER — Ambulatory Visit: Payer: Medicaid Other | Admitting: Family Medicine

## 2024-01-10 ENCOUNTER — Ambulatory Visit: Payer: Medicaid Other | Admitting: Family Medicine

## 2024-01-10 DIAGNOSIS — R1030 Lower abdominal pain, unspecified: Secondary | ICD-10-CM | POA: Diagnosis not present

## 2024-01-10 DIAGNOSIS — R109 Unspecified abdominal pain: Secondary | ICD-10-CM | POA: Insufficient documentation

## 2024-01-10 MED ORDER — SULFAMETHOXAZOLE-TRIMETHOPRIM 800-160 MG PO TABS: 1.0000 | ORAL_TABLET | Freq: Two times a day (BID) | ORAL | 0 refills | Status: AC

## 2024-01-10 NOTE — Progress Notes (Signed)
 Established Patient Office Visit  Subjective:  Patient ID: Selena Barr, female    DOB: [birth year]  Age: 33 y.o. MRN: [id number]  CC:  Chief Complaint  Patient presents with   Abdominal Pain    Still having episodes of right lower quadrant pain like when she was admitted for kidney stones. The pain was better after discharge and came back Sunday. Rated pain Sunday 10/10. Not bothering her today   Follow-up    1 month recheck    HPI Selena Barr is a 33 y.o. female with past medical history of essential hypertension, type II diabetes, and tobacco abuse presents for f/u. For the details of today's visit, please refer to the assessment and plan.     Past Medical History:  Diagnosis Date   Allergy     Diabetes mellitus without complication (HCC)    Hyperlipidemia 10/24/2016   Hypertension    Tobacco abuse 09/22/2016    Past Surgical History:  Procedure Laterality Date   WISDOM TOOTH EXTRACTION      Family History  Problem Relation Age of Onset   Hypertension Mother    Diabetes Mother    Alcohol abuse Mother    COPD Mother    Drug abuse Mother    Cancer Maternal Grandmother        throat   Cancer Maternal Grandfather        throat   Diabetes Maternal Grandfather     Social History   Socioeconomic History   Marital status: Single    Spouse name: Not on file   Number of children: 1   Years of education: 12   Highest education level: 11th grade  Occupational History    Comment: unemployed  Tobacco Use   Smoking status: Every Day    Current packs/day: 1.00    Average packs/day: 1 pack/day for 2.0 years (2.0 ttl pk-yrs)    Types: Cigarettes   Smokeless tobacco: Never   Tobacco comments:    1ppd  Vaping Use   Vaping status: Never Used  Substance and Sexual Activity   Alcohol use: No   Drug use: No    Comment: denies use 08/28/14   Sexual activity: Yes    Birth control/protection: Implant  Other Topics Concern   Not on file  Social History  Narrative   Lives with mother and brother, sister, daughter   Social Drivers of Health   Financial Resource Strain: Low Risk  (01/10/2024)   Overall Financial Resource Strain (CARDIA)    Difficulty of Paying Living Expenses: Not hard at all  Food Insecurity: No Food Insecurity (01/10/2024)   Hunger Vital Sign    Worried About Running Out of Food in the Last Year: Never true    Ran Out of Food in the Last Year: Never true  Transportation Needs: No Transportation Needs (01/10/2024)   PRAPARE - Administrator, Civil Service (Medical): No    Lack of Transportation (Non-Medical): No  Physical Activity: Sufficiently Active (01/10/2024)   Exercise Vital Sign    Days of Exercise per Week: 7 days    Minutes of Exercise per Session: 150+ min  Stress: No Stress Concern Present (01/10/2024)   [employer] of Occupational [employer] - Occupational Stress Questionnaire    Feeling of Stress : Not at all  Recent Concern: Stress - Stress Concern Present (11/08/2023)   [employer] of Occupational Health - Occupational Stress Questionnaire    Feeling of Stress : Rather much  Social Connections:  Moderately Isolated (01/10/2024)   Social Connection and Isolation Panel [NHANES]    Frequency of Communication with Friends and Family: More than three times a week    Frequency of Social Gatherings with Friends and Family: More than three times a week    Attends Religious Services: Never    Database Administrator or Organizations: No    Attends Engineer, Structural: Not on file    Marital Status: Living with partner  Intimate Partner Violence: Not At Risk (11/23/2023)   Humiliation, Afraid, Rape, and Kick questionnaire    Fear of Current or Ex-Partner: No    Emotionally Abused: No    Physically Abused: No    Sexually Abused: No    Outpatient Medications Prior to Visit  Medication Sig Dispense Refill   Accu-Chek Softclix Lancets lancets 1 each by Other route 3 (three) times daily.      albuterol  (VENTOLIN  HFA) 108 (90 Base) MCG/ACT inhaler Inhale 2 puffs into the lungs every 6 (six) hours as needed for wheezing or shortness of breath. 8 g 2   amlodipine -olmesartan  (AZOR ) 10-20 MG tablet Take 1 tablet by mouth daily. 30 tablet 1   Blood Glucose Monitoring Suppl DEVI 1 each by Does not apply route in the morning, at noon, and at bedtime. May substitute to any manufacturer covered by patient's insurance. 1 each 1   Blood Pressure KIT Take blood pressure once daily 1 kit 0   Etonogestrel  (NEXPLANON  Eleanor) Inject into the skin.     Glucose Blood (BLOOD GLUCOSE TEST STRIPS) STRP 1 each by In Vitro route 2 (two) times daily. May substitute to any manufacturer covered by patient's insurance. 180 each 3   rosuvastatin  (CRESTOR ) 10 MG tablet Take 1 tablet (10 mg total) by mouth daily. 90 tablet 3   Semaglutide ,0.25 or 0.5MG /DOS, (OZEMPIC , 0.25 OR 0.5 MG/DOSE,) 2 MG/3ML SOPN Inject 0.25 mg into the skin once a week. 3 mL 0   Vitamin D , Ergocalciferol , (DRISDOL ) 1.25 MG (50000 UNIT) CAPS capsule Take 1 capsule (50,000 Units total) by mouth every 7 (seven) days. (Patient taking differently: Take 50,000 Units by mouth every Wednesday.) 20 capsule 1   levocetirizine (XYZAL ) 5 MG tablet Take 1 tablet (5 mg total) by mouth every evening. (Patient not taking: Reported on 01/10/2024) 30 tablet 1   promethazine -dextromethorphan (PROMETHAZINE -DM) 6.25-15 MG/5ML syrup Take 5 mLs by mouth 4 (four) times daily as needed. (Patient not taking: Reported on 01/10/2024) 118 mL 0   No facility-administered medications prior to visit.    Allergies  Allergen Reactions   Solu-Medrol  [Methylprednisolone  Sodium Succ]     Rash, lip swelling     ROS Review of Systems  Constitutional:  Negative for chills and fever.  Eyes:  Negative for visual disturbance.  Respiratory:  Negative for chest tightness and shortness of breath.   Neurological:  Negative for dizziness and headaches.      Objective:    Physical  Exam HENT:     Head: Normocephalic.     Mouth/Throat:     Mouth: Mucous membranes are moist.  Cardiovascular:     Rate and Rhythm: Normal rate.     Heart sounds: Normal heart sounds.  Pulmonary:     Effort: Pulmonary effort is normal.     Breath sounds: Normal breath sounds.  Neurological:     Mental Status: She is alert.     BP 116/76   Pulse (!) 103   Resp 16   Ht 5' 3 (1.6  m)   Wt (!) 330 lb (149.7 kg)   SpO2 96%   BMI 58.46 kg/m  Wt Readings from Last 3 Encounters:  01/10/24 (!) 330 lb (149.7 kg)  12/28/23 (!) 330 lb (149.7 kg)  12/06/23 (!) 329 lb 12 oz (149.6 kg)    Lab Results  Component Value Date   TSH 0.704 11/09/2023   Lab Results  Component Value Date   WBC 10.4 12/06/2023   HGB 14.4 12/06/2023   HCT 43.7 12/06/2023   MCV 87 12/06/2023   PLT 442 12/06/2023   Lab Results  Component Value Date   NA 139 12/06/2023   K 4.7 12/06/2023   CO2 24 12/06/2023   GLUCOSE 206 (H) 12/06/2023   BUN 12 12/06/2023   CREATININE 0.64 12/06/2023   BILITOT 0.4 11/24/2023   ALKPHOS 72 11/24/2023   AST 16 11/24/2023   ALT 19 11/24/2023   PROT 7.0 11/24/2023   ALBUMIN 3.2 (L) 11/24/2023   CALCIUM  9.2 12/06/2023   ANIONGAP 8 11/24/2023   EGFR 121 12/06/2023   Lab Results  Component Value Date   CHOL 176 11/09/2023   Lab Results  Component Value Date   HDL 32 (L) 11/09/2023   Lab Results  Component Value Date   LDLCALC 123 (H) 11/09/2023   Lab Results  Component Value Date   TRIG 117 11/09/2023   Lab Results  Component Value Date   CHOLHDL 5.5 (H) 11/09/2023   Lab Results  Component Value Date   HGBA1C 8.9 (H) 11/09/2023      Assessment & Plan:  Lower abdominal pain Assessment & Plan: Urinalysis is negative for leukocytes and nitrates. Will initiate therapy with Bactrim  for five days. Preventive measures for UTIs were discussed, including taking showers instead of baths, avoiding prolonged holding of urine, and increasing hydration to at  least 64 ounces daily. The patient is encouraged to complete the full course of antibiotics and to follow up if symptoms worsen or fail to improve.   Orders: -     Urinalysis -     Sulfamethoxazole -Trimethoprim ; Take 1 tablet by mouth 2 (two) times daily for 5 days.  Dispense: 10 tablet; Refill: 0  Note: This chart has been completed using Engineer, Civil (consulting) software, and while attempts have been made to ensure accuracy, certain words and phrases may not be transcribed as intended.    Follow-up: No follow-ups on file.   Hallel Denherder, FNP

## 2024-01-10 NOTE — Patient Instructions (Addendum)
 I appreciate the opportunity to provide care to you today!  Nonpharmacological Interventions for Kidney Stone Prevention Hydration: Drink at least 2.5 to 3 liters of water daily to dilute urine and reduce stone formation. Aim for clear or light yellow urine as an indicator of proper hydration. Lemon water (citrate) can help prevent certain types of stones. Dietary Modifications:Reduce sodium intake: Excess salt increases calcium  excretion in urine, which can contribute to stone formation. Limit animal protein: High intake of red meat, poultry, and fish increases acid levels in urine, raising the risk of stones. Increase citrate-rich foods: Citrus fruits (lemons, oranges, limes) help prevent calcium -based stones. Moderate calcium  intake: Don't eliminate calcium , but aim for dietary sources (e.g., dairy, leafy greens) rather than supplements. Avoid oxalate-rich foods if prone to calcium  oxalate stones: Spinach, nuts, chocolate, tea, and beets can contribute to stone formation. Maintain a Healthy Weight:Obesity and metabolic syndrome increase the risk of kidney stones. Regular physical activity helps regulate metabolism and urine composition. Limit Sugary Beverages and Alcohol:Avoid sodas, especially those with phosphoric acid, as they may promote stone formation. Minimize alcohol intake, as it can lead to dehydration.   Attached with your AVS, you will find valuable resources for self-education. I highly recommend dedicating some time to thoroughly examine them.   Please continue to a heart-healthy diet and increase your physical activities. Try to exercise for at least five days a week.    It was a pleasure to see you and I look forward to continuing to work together on your health and well-being. Please do not hesitate to call the office if you need care or have questions about your care.  In case of emergency, please visit the Emergency Department for urgent care, or contact our  clinic at [phone] to schedule an appointment. We're here to help you!   Have a wonderful day and week. With Gratitude, Miloh Alcocer MSN, FNP-BC

## 2024-01-10 NOTE — Assessment & Plan Note (Signed)
 Urinalysis is negative for leukocytes and nitrates. Will initiate therapy with Bactrim  for five days. Preventive measures for UTIs were discussed, including taking showers instead of baths, avoiding prolonged holding of urine, and increasing hydration to at least 64 ounces daily. The patient is encouraged to complete the full course of antibiotics and to follow up if symptoms worsen or fail to improve.

## 2024-01-11 LAB — URINALYSIS
Bilirubin, UA: NEGATIVE
Glucose, UA: NEGATIVE
Ketones, UA: NEGATIVE
Nitrite, UA: NEGATIVE
Protein,UA: NEGATIVE
RBC, UA: NEGATIVE
Specific Gravity, UA: 1.02 (ref 1.005–1.030)
Urobilinogen, Ur: 0.2 mg/dL (ref 0.2–1.0)
pH, UA: 6 (ref 5.0–7.5)

## 2024-01-17 ENCOUNTER — Other Ambulatory Visit: Payer: Self-pay | Admitting: Family Medicine

## 2024-01-17 DIAGNOSIS — I10 Essential (primary) hypertension: Secondary | ICD-10-CM

## 2024-01-22 ENCOUNTER — Ambulatory Visit: Payer: Self-pay | Admitting: Family Medicine

## 2024-01-22 DIAGNOSIS — L0291 Cutaneous abscess, unspecified: Secondary | ICD-10-CM | POA: Diagnosis not present

## 2024-01-22 DIAGNOSIS — Z6841 Body Mass Index (BMI) 40.0 and over, adult: Secondary | ICD-10-CM | POA: Diagnosis not present

## 2024-01-22 DIAGNOSIS — R030 Elevated blood-pressure reading, without diagnosis of hypertension: Secondary | ICD-10-CM | POA: Diagnosis not present

## 2024-01-22 DIAGNOSIS — L02415 Cutaneous abscess of right lower limb: Secondary | ICD-10-CM | POA: Diagnosis not present

## 2024-01-22 NOTE — Telephone Encounter (Signed)
 Copied from CRM [id number]. Topic: Clinical - Red Word Triage >> Jan 22, 2024  8:55 AM Carlatta H wrote: [facility] that prompted transfer to Nurse Triage:Patient has had a boil for about 1 month and its swollen and draining//  Chief Complaint: boil with multiple heads on right thigh Symptoms: pain 10/10, drainage, had fever Frequency: 1 month Pertinent Negatives: Patient denies fever at this time, numbness, rash Disposition: [x] ED /[] Urgent Care (no appt availability in office) / [] Appointment(In office/virtual)/ []  [facility]/ [] Home Care/ [] Refused Recommended Disposition /[] [facility]/ []  Follow-up with PCP Additional Notes: no apt available today; instructed to got to the ER for treatment.  Care advice given, denies questions, office updated.   Reason for Disposition  SEVERE pain (e.g., excruciating)  Answer Assessment - Initial Assessment Questions 1. APPEARANCE of BOIL: "What does the boil look like?"      Swollen, big knot, head is open and it is sore; red 2. LOCATION: "Where is the boil located?"      Right upper thigh 3. NUMBER: "How many boils are there?"      2 4. SIZE: "How big is the boil?" (e.g., inches, cm; compare to size of a coin or other object)     Golf ball size  5. ONSET: "When did the boil start?"     1 month ago 6. PAIN: "Is there any pain?" If Yes, ask: "How bad is the pain?"   (Scale 1-10; or mild, moderate, severe)     10/10 7. FEVER: "Do you have a fever?" If Yes, ask: "What is it, how was it measured, and when did it start?"      Denies today; did have 101 temp. 8. SOURCE: "Have you been around anyone with boils or other Staph infections?" "Have you ever had boils before?"     denies 9. OTHER SYMPTOMS: "Do you have any other symptoms?" (e.g., shaking chills, weakness, rash elsewhere on body)     denies 10. PREGNANCY: "Is there any chance you are pregnant?" "When was your last menstrual period?"       na  Protocols used: Boil (Skin  Abscess)-A-AH

## 2024-01-26 DIAGNOSIS — H5213 Myopia, bilateral: Secondary | ICD-10-CM | POA: Diagnosis not present

## 2024-01-29 ENCOUNTER — Ambulatory Visit: Payer: Self-pay | Admitting: Family Medicine

## 2024-01-29 DIAGNOSIS — R112 Nausea with vomiting, unspecified: Secondary | ICD-10-CM | POA: Diagnosis not present

## 2024-01-29 DIAGNOSIS — A0839 Other viral enteritis: Secondary | ICD-10-CM | POA: Diagnosis not present

## 2024-01-29 NOTE — Telephone Encounter (Signed)
 Chief Complaint: upper abdominal pain Symptoms: upper abdominal pain comes and goes and radiates to right side, vomiting (6 episodes in the last 24 hours), diarrhea (more than 6 episodes in 24 hours), dizziness, decreased urine output Frequency: x 3 days Pertinent Negatives: Patient denies back pain, fever, chest pain, difficulty breathing, sweating, blood in vomit or diarrhea Disposition: [x] ED /[] Urgent Care (no appt availability in office) / [] Appointment(In office/virtual)/ []  [facility]/ [] Home Care/ [] Refused Recommended Disposition /[] [facility]/ []  Follow-up with PCP Additional Notes: Advised patient go to ED due to risk for dehydration. Patient states in the past when she had this pain she went to the hospital and was admitted for 2 days. Patient agreeable to go to ED.  Copied from CRM [id number]. Topic: Clinical - Red Word Triage >> Jan 29, 2024  9:32 AM Selena Barr wrote: Red Word that prompted transfer to Nurse Triage: Patient has been experiencing vomiting and diarrhea for 3 days. Reason for Disposition  Patient sounds very sick or weak to the triager  Answer Assessment - Initial Assessment Questions 1. LOCATION: "Where does it hurt?"      Upper middle abdomen.  2. RADIATION: "Does the pain shoot anywhere else?" (e.g., chest, back)     Right side.  3. ONSET: "When did the pain begin?" (e.g., minutes, hours or days ago)      Today.  4. SUDDEN: "Gradual or sudden onset?"     Sudden.  5. PATTERN "Does the pain come and go, or is it constant?"    - If it comes and goes: "How long does it last?" "Do you have pain now?"     (Note: Comes and goes means the pain is intermittent. It goes away completely between bouts.)    - If constant: "Is it getting better, staying the same, or getting worse?"      (Note: Constant means the pain never goes away completely; most serious pain is constant and gets worse.)      Comes and goes, about 5 minutes. Denies any right  now, last pain was 10 min ago.  6. SEVERITY: "How bad is the pain?"  (e.g., Scale 1-10; mild, moderate, or severe)    - MILD (1-3): Doesn't interfere with normal activities, abdomen soft and not tender to touch..     - MODERATE (4-7): Interferes with normal activities or awakens from sleep, abdomen tender to touch.     - SEVERE (8-10): Excruciating pain, doubled over, unable to do any normal activities.       Sharp pain, when it comes on its "like labor pain".  7. RECURRENT SYMPTOM: "Have you ever had this type of stomach pain before?" If Yes, ask: "When was the last time?" and "What happened that time?"      She states yes in the past when she had trouble with her kidneys.  8. AGGRAVATING FACTORS: "Does anything seem to cause this pain?" (e.g., foods, stress, alcohol)     She states anything she eats or drinks comes up or goes down (vomiting and diarrhea).  9. CARDIAC SYMPTOMS: "Do you have any of the following symptoms: chest pain, difficulty breathing, sweating, nausea?"     Denies sweating, states she feels real hot.  10. OTHER SYMPTOMS: "Do you have any other symptoms?" (e.g., back pain, diarrhea, fever, urination pain, vomiting)       Vomiting (6 episodes in the last 24 hours)  and diarrhea (more than 6 episodes in the last 24 hours) x  3 days.  11. PREGNANCY: "Is there any chance you are pregnant?" "When was your last menstrual period?"       LMP about 2 years ago due to the type of birth control she takes.  Protocols used: Abdominal Pain - Upper-A-AH

## 2024-02-07 DIAGNOSIS — M79671 Pain in right foot: Secondary | ICD-10-CM | POA: Diagnosis not present

## 2024-02-13 DIAGNOSIS — E782 Mixed hyperlipidemia: Secondary | ICD-10-CM | POA: Diagnosis not present

## 2024-02-13 DIAGNOSIS — E119 Type 2 diabetes mellitus without complications: Secondary | ICD-10-CM | POA: Diagnosis not present

## 2024-02-14 ENCOUNTER — Encounter: Payer: Self-pay | Admitting: "Endocrinology

## 2024-02-14 ENCOUNTER — Ambulatory Visit: Payer: Self-pay | Admitting: "Endocrinology

## 2024-02-14 VITALS — BP 140/82 | HR 92 | Ht 63.0 in | Wt 334.2 lb

## 2024-02-14 DIAGNOSIS — Z6841 Body Mass Index (BMI) 40.0 and over, adult: Secondary | ICD-10-CM | POA: Diagnosis not present

## 2024-02-14 DIAGNOSIS — E119 Type 2 diabetes mellitus without complications: Secondary | ICD-10-CM | POA: Diagnosis not present

## 2024-02-14 DIAGNOSIS — Z7985 Long-term (current) use of injectable non-insulin antidiabetic drugs: Secondary | ICD-10-CM

## 2024-02-14 DIAGNOSIS — E782 Mixed hyperlipidemia: Secondary | ICD-10-CM | POA: Diagnosis not present

## 2024-02-14 DIAGNOSIS — I10 Essential (primary) hypertension: Secondary | ICD-10-CM | POA: Diagnosis not present

## 2024-02-14 DIAGNOSIS — Z7984 Long term (current) use of oral hypoglycemic drugs: Secondary | ICD-10-CM | POA: Diagnosis not present

## 2024-02-14 LAB — POCT GLYCOSYLATED HEMOGLOBIN (HGB A1C): HbA1c, POC (controlled diabetic range): 10.6 % — AB (ref 0.0–7.0)

## 2024-02-14 LAB — COMPREHENSIVE METABOLIC PANEL
ALT: 16 IU/L (ref 0–32)
AST: 12 IU/L (ref 0–40)
Albumin: 3.9 g/dL (ref 3.9–4.9)
Alkaline Phosphatase: 103 IU/L (ref 44–121)
BUN/Creatinine Ratio: 14 (ref 9–23)
BUN: 11 mg/dL (ref 6–20)
Bilirubin Total: 0.4 mg/dL (ref 0.0–1.2)
CO2: 24 mmol/L (ref 20–29)
Calcium: 9.1 mg/dL (ref 8.7–10.2)
Chloride: 101 mmol/L (ref 96–106)
Creatinine, Ser: 0.81 mg/dL (ref 0.57–1.00)
Globulin, Total: 3 g/dL (ref 1.5–4.5)
Glucose: 258 mg/dL — ABNORMAL HIGH (ref 70–99)
Potassium: 4.7 mmol/L (ref 3.5–5.2)
Sodium: 138 mmol/L (ref 134–144)
Total Protein: 6.9 g/dL (ref 6.0–8.5)
eGFR: 99 mL/min/{1.73_m2} (ref >59)

## 2024-02-14 LAB — LIPID PANEL
Chol/HDL Ratio: 3.9 ratio (ref 0.0–4.4)
Cholesterol, Total: 129 mg/dL (ref 100–199)
HDL: 33 mg/dL — ABNORMAL LOW (ref >39)
LDL Chol Calc (NIH): 82 mg/dL (ref 0–99)
Triglycerides: 70 mg/dL (ref 0–149)
VLDL Cholesterol Cal: 14 mg/dL (ref 5–40)

## 2024-02-14 MED ORDER — METFORMIN HCL ER 500 MG PO TB24
500.0000 mg | ORAL_TABLET | Freq: Two times a day (BID) | ORAL | 1 refills | Status: DC
Start: 2024-02-14 — End: 2024-06-26

## 2024-02-14 MED ORDER — OZEMPIC (0.25 OR 0.5 MG/DOSE) 2 MG/3ML ~~LOC~~ SOPN: 0.2500 mg | PEN_INJECTOR | SUBCUTANEOUS | 0 refills | Status: DC

## 2024-02-14 MED ORDER — GLIPIZIDE ER 5 MG PO TB24: 5.0000 mg | ORAL_TABLET | Freq: Every day | ORAL | 1 refills | Status: AC

## 2024-02-14 NOTE — Progress Notes (Signed)
 02/14/2024, 5:38 PM   Endocrinology follow-up note  Subjective:    Patient ID: Selena Barr, female    DOB: 1991-10-30.  Selena Barr is being seen in follow-up after she was seen in consultation  for management of currently uncontrolled symptomatic diabetes .   Past Medical History:  Diagnosis Date   Allergy    Diabetes mellitus without complication (HCC)    Hyperlipidemia 10/24/2016   Hypertension    Tobacco abuse 09/22/2016    Past Surgical History:  Procedure Laterality Date   WISDOM TOOTH EXTRACTION      Social History   Socioeconomic History   Marital status: Single    Spouse name: Not on file   Number of children: 1   Years of education: 12   Highest education level: 11th grade  Occupational History    Comment: unemployed  Tobacco Use   Smoking status: Every Day    Current packs/day: 1.00    Average packs/day: 1 pack/day for 2.0 years (2.0 ttl pk-yrs)    Types: Cigarettes   Smokeless tobacco: Never   Tobacco comments:    1ppd  Vaping Use   Vaping status: Never Used  Substance and Sexual Activity   Alcohol use: No   Drug use: No    Comment: denies use 08/28/14   Sexual activity: Yes    Birth control/protection: Implant  Other Topics Concern   Not on file  Social History Narrative   Lives with mother and brother, sister, daughter   Social Drivers of Health   Financial Resource Strain: Low Risk  (01/10/2024)   Overall Financial Resource Strain (CARDIA)    Difficulty of Paying Living Expenses: Not hard at all  Food Insecurity: No Food Insecurity (01/10/2024)   Hunger Vital Sign    Worried About Running Out of Food in the Last Year: Never true    Ran Out of Food in the Last Year: Never true  Transportation Needs: No Transportation Needs (01/10/2024)   PRAPARE - Administrator, Civil Service (Medical): No    Lack of Transportation (Non-Medical): No   Physical Activity: Sufficiently Active (01/10/2024)   Exercise Vital Sign    Days of Exercise per Week: 7 days    Minutes of Exercise per Session: 150+ min  Stress: No Stress Concern Present (01/10/2024)   [employer] of Occupational Health - Occupational Stress Questionnaire    Feeling of Stress : Not at all  Recent Concern: Stress - Stress Concern Present (11/08/2023)   [employer] of Occupational Health - Occupational Stress Questionnaire    Feeling of Stress : Rather much  Social Connections: Moderately Isolated (01/10/2024)   Social Connection and Isolation Panel [NHANES]    Frequency of Communication with Friends and Family: More than three times a week    Frequency of Social Gatherings with Friends and Family: More than three times a week    Attends Religious Services: Never    Database administrator or Organizations: No    Attends Engineer, structural: Not on file    Marital Status: Living with partner    Family History  Problem Relation Age of Onset   Hypertension Mother    Diabetes Mother    Alcohol abuse Mother    COPD Mother    Drug abuse Mother    Cancer Maternal Grandmother        throat   Cancer Maternal Grandfather        throat   Diabetes Maternal Grandfather     Outpatient Encounter Medications as of 02/14/2024  Medication Sig   glipiZIDE (GLUCOTROL XL) 5 MG 24 hr tablet Take 1 tablet (5 mg total) by mouth daily with breakfast.   metFORMIN (GLUCOPHAGE-XR) 500 MG 24 hr tablet Take 1 tablet (500 mg total) by mouth 2 (two) times daily with a meal.   Accu-Chek Softclix Lancets lancets 1 each by Other route 3 (three) times daily.   albuterol (VENTOLIN HFA) 108 (90 Base) MCG/ACT inhaler Inhale 2 puffs into the lungs every 6 (six) hours as needed for wheezing or shortness of breath.   amlodipine-olmesartan (AZOR) 10-20 MG tablet Take 1 tablet by mouth once daily   Blood Glucose Monitoring Suppl DEVI 1 each by Does not apply route in the morning, at  noon, and at bedtime. May substitute to any manufacturer covered by patient's insurance.   Blood Pressure KIT Take blood pressure once daily   Etonogestrel (NEXPLANON Walker) Inject into the skin.   Glucose Blood (BLOOD GLUCOSE TEST STRIPS) STRP 1 each by In Vitro route 2 (two) times daily. May substitute to any manufacturer covered by patient's insurance.   levocetirizine (XYZAL) 5 MG tablet Take 1 tablet (5 mg total) by mouth every evening. (Patient not taking: Reported on 01/10/2024)   promethazine-dextromethorphan (PROMETHAZINE-DM) 6.25-15 MG/5ML syrup Take 5 mLs by mouth 4 (four) times daily as needed. (Patient not taking: Reported on 01/10/2024)   rosuvastatin (CRESTOR) 10 MG tablet Take 1 tablet (10 mg total) by mouth daily.   Semaglutide,0.25 or 0.5MG /DOS, (OZEMPIC, 0.25 OR 0.5 MG/DOSE,) 2 MG/3ML SOPN Inject 0.25 mg into the skin once a week.   Vitamin D, Ergocalciferol, (DRISDOL) 1.25 MG (50000 UNIT) CAPS capsule Take 1 capsule (50,000 Units total) by mouth every 7 (seven) days. (Patient taking differently: Take 50,000 Units by mouth every Wednesday.)   [DISCONTINUED] hydrochlorothiazide (HYDRODIURIL) 25 MG tablet Take 1 tablet (25 mg total) by mouth daily. (Patient not taking: Reported on 03/05/2019)   [DISCONTINUED] norethindrone (MICRONOR,CAMILA,ERRIN) 0.35 MG tablet Take 1 tablet (0.35 mg total) by mouth daily.   [DISCONTINUED] Semaglutide,0.25 or 0.5MG /DOS, (OZEMPIC, 0.25 OR 0.5 MG/DOSE,) 2 MG/3ML SOPN Inject 0.25 mg into the skin once a week. (Patient not taking: Reported on 02/14/2024)   No facility-administered encounter medications on file as of 02/14/2024.    ALLERGIES: Allergies  Allergen Reactions   Solu-Medrol [Methylprednisolone Sodium Succ]     Rash, lip swelling     VACCINATION STATUS: Immunization History  Administered Date(s) Administered   HPV Quadrivalent 08/18/2008   Hepatitis A 11/17/2008   Hepatitis B 12/14/1997, 08/29/2004, 02/27/2005   Influenza Whole 09/22/2016    Influenza,inj,Quad PF,6+ Mos 11/08/2017, 08/19/2018   Influenza-Unspecified 12/06/2012   MMR 01/07/2019   Meningococcal Conjugate 08/18/2008   Pneumococcal Conjugate-13 11/08/2017   Pneumococcal Polysaccharide-23 01/07/2019   Tdap 09/16/2015, 11/25/2018    Diabetes She presents for her follow-up diabetic visit. She has type 2 diabetes mellitus. Onset time: Patient was diagnosed with type 2 diabetes at approximate age of 58.  She was seen prior to 2019 for the same diagnosis, however did not return for follow-up appointment. Her disease course has  been worsening. There are no hypoglycemic associated symptoms. Pertinent negatives for hypoglycemia include no confusion, headaches, pallor or seizures. Associated symptoms include fatigue, polydipsia and polyuria. Pertinent negatives for diabetes include no chest pain and no polyphagia. There are no hypoglycemic complications. Symptoms are worsening. (Morbid obesity, chronic heavy smoking. ) Risk factors for coronary artery disease include dyslipidemia, diabetes mellitus, obesity, tobacco exposure, sedentary lifestyle, hypertension and family history. Current diabetic treatments: She is currently on metformin 1000 mg p.o. twice daily, glipizide 5 p.o. daily once a day.  She did not call with Ozempic which she is stopped. Her weight is increasing steadily. She is following a generally unhealthy diet. When asked about meal planning, she reported none. Her home blood glucose trend is increasing steadily. (She presents with no meter nor logs.  Her point-of-care A1c was 10.6% increasing from 7.6%.  Admittedly, she has not been taking her metformin nor glipizide.  She deals with social stressors and has not been engaged with self-care.   She does have a prescription for Ozempic 0.25 mg subcutaneously, took only 1 dose so far with some GI side effect.  ) An ACE inhibitor/angiotensin II receptor blocker is not being taken.  Hypertension This is a chronic problem.  The problem is controlled. Pertinent negatives include no chest pain, headaches, palpitations or shortness of breath. Risk factors for coronary artery disease include dyslipidemia, diabetes mellitus, obesity, family history, smoking/tobacco exposure and sedentary lifestyle. Past treatments include calcium channel blockers.  Hyperlipidemia This is a chronic problem. The problem is uncontrolled. Exacerbating diseases include diabetes and obesity. Pertinent negatives include no chest pain, myalgias or shortness of breath. Risk factors for coronary artery disease include dyslipidemia, diabetes mellitus, hypertension, family history, obesity and a sedentary lifestyle.     Objective:       02/14/2024    2:03 PM 01/10/2024    4:05 PM 12/28/2023   11:19 AM  Vitals with BMI  Height 5\' 3"  5\' 3"  5\' 3"   Weight 334 lbs 3 oz 330 lbs 330 lbs  BMI 59.22 58.47 58.47  Systolic 140 116 213  Diastolic 82 76 89  Pulse 92 103 84    BP (!) 140/82   Pulse 92   Ht 5\' 3"  (1.6 m)   Wt (!) 334 lb 3.2 oz (151.6 kg)   BMI 59.20 kg/m   Wt Readings from Last 3 Encounters:  02/14/24 (!) 334 lb 3.2 oz (151.6 kg)  01/10/24 (!) 330 lb (149.7 kg)  12/28/23 (!) 330 lb (149.7 kg)      CMP ( most recent) CMP     Component Value Date/Time   NA 138 02/13/2024 1049   K 4.7 02/13/2024 1049   CL 101 02/13/2024 1049   CO2 24 02/13/2024 1049   GLUCOSE 258 (H) 02/13/2024 1049   GLUCOSE 252 (H) 11/24/2023 0619   BUN 11 02/13/2024 1049   CREATININE 0.81 02/13/2024 1049   CREATININE 0.70 12/19/2017 0900   CALCIUM 9.1 02/13/2024 1049   PROT 6.9 02/13/2024 1049   ALBUMIN 3.9 02/13/2024 1049   AST 12 02/13/2024 1049   ALT 16 02/13/2024 1049   ALKPHOS 103 02/13/2024 1049   BILITOT 0.4 02/13/2024 1049   GFRNONAA >60 11/24/2023 0619   GFRNONAA 124 11/08/2017 1129   GFRAA >60 10/18/2018 0825   GFRAA 144 11/08/2017 1129     Diabetic Labs (most recent): Lab Results  Component Value Date   HGBA1C 10.6 (A) 02/14/2024    HGBA1C 8.9 (H) 11/09/2023   HGBA1C  7.6 (A) 05/03/2023   MICROALBUR 1.7 11/08/2017   MICROALBUR 0.7 10/23/2016     Lipid Panel ( most recent) Lipid Panel     Component Value Date/Time   CHOL 129 02/13/2024 1049   TRIG 70 02/13/2024 1049   HDL 33 (L) 02/13/2024 1049   CHOLHDL 3.9 02/13/2024 1049   CHOLHDL 6.5 (H) 11/08/2017 1129   VLDL 21 01/24/2017 1055   LDLCALC 82 02/13/2024 1049   LDLCALC 131 (H) 11/08/2017 1129   LABVLDL 14 02/13/2024 1049      Lab Results  Component Value Date   TSH 0.704 11/09/2023   TSH 0.595 10/18/2022   TSH 0.566 01/30/2022   TSH 1.17 12/19/2017   TSH 0.63 10/23/2016   FREET4 1.34 11/09/2023   FREET4 1.53 10/18/2022   FREET4 1.7 12/19/2017       Assessment & Plan:   1. Type 2 diabetes mellitus without complication, without long-term current use of insulin (HCC)  - Selena Barr has currently uncontrolled symptomatic type 2 DM since  33 years of age.  She presents with no meter nor logs.  Her point-of-care A1c was 10.6% increasing from 7.6%.  Admittedly, she has not been taking her metformin nor glipizide.  She deals with social stressors and has not been engaged with self-care.   She does have a prescription for Ozempic 0.25 mg subcutaneously, took only 1 dose so far with some GI side effect.  Recent labs reviewed. - I had a long discussion with her about the progressive nature of diabetes and the pathology behind its complications. -her diabetes is complicated by morbid obesity, smoking, and she remains at a high risk for more acute and chronic complications which include CAD, CVA, CKD, retinopathy, and neuropathy. These are all discussed in detail with her.  - I discussed all available options of managing her diabetes including de-escalation of medications. I have counseled her on diet  and weight management  by adopting a Whole Food , Plant Predominant  ( WFPP) nutrition as recommended by [employer] of Lifestyle Medicine.  Patient is encouraged to switch to  unprocessed or minimally processed  complex starch, adequate protein intake (mainly plant source), minimal liquid fat ( mainly vegetable oils), plenty of fruits, and vegetables. -  she is advised to stick to a routine mealtimes to eat 3 complete meals a day and snack only when necessary ( to snack only to correct hypoglycemia BG <70 day time or <100 at night).    - she acknowledges that there is a room for improvement in her food and drink choices. - Suggestion is made for her to avoid simple carbohydrates  from her diet including Cakes, Sweet Desserts, Ice Cream, Soda (diet and regular), Sweet Tea, Candies, Chips, Cookies, Store Bought Juices, Alcohol , Artificial Sweeteners,  Coffee Creamer, and "Sugar-free" Products, Lemonade. This will help patient to have more stable blood glucose profile and potentially avoid unintended weight gain.  - I have approached her with the following plan to manage  her diabetes and patient agrees:  -In light of her presentation with significant glycemic burden, she will need multiple medications to achieve control of diabetes to target. - I advised and prescribed metformin 500 mg XR p.o. twice daily with breakfast and supper.  I discussed and prescribed glipizide 5 mg XL p.o. daily at breakfast.  -She is approached to monitor blood glucose at least twice a day-daily before breakfast and at bedtime. -She is encouraged to resume and continue Ozempic 0.25 mg  subcutaneously weekly.  This medication will be advanced as she tolerates.  - she is encouraged to call clinic for blood glucose levels less than 70 or above 200 mg /dl. -She is advised to return in 4 weeks with meter and logs for reevaluation.  If she does not control glycemia to target, she will be considered for insulin treatment.   - Specific targets for  A1c;  LDL, HDL,  and Triglycerides were discussed with the patient.  2) Blood Pressure /Hypertension:  Her blood  pressure is not controlled to target.  She has not been consistent with her blood pressure medications either.  she is advised to continue her current medications including amlodipine 10-olmesartan 20 mg p.o. daily with breakfast .    3) Lipids/Hyperlipidemia:   Review of her recent lipid panel showed improved LDL to 82 from 131.  She is currently on Crestor 10 mg p.o. nightly.  Advised to continue.     4) morbid obesity:  Body mass index is 59.2 kg/m.   clearly complicating her diabetes care.   she is  a candidate for weight loss. I discussed with her the fact that loss of 5 - 10% of her  current body weight will have the most impact on her diabetes management.  The above detailed  ACLM recommendations for nutrition, exercise, sleep, social life, avoidance of risky substances, the need for restorative sleep   information will also detailed on discharge instructions.  5) Chronic Care/Health Maintenance:  -she  is not  on ACEI/ARB and Statin medications and  is encouraged to initiate and continue to follow up with Ophthalmology, Dentist,  Podiatrist at least yearly or according to recommendations, and advised to  quit smoking. I have recommended yearly flu vaccine and pneumonia vaccine at least every 5 years; moderate intensity exercise for up to 150 minutes weekly; and  sleep for 7- 9 hours a day.  The patient was counseled on the dangers of tobacco use, and was advised to quit.  Reviewed strategies to maximize success, including removing cigarettes and smoking materials from environment.   - she is  advised to maintain close follow up with Gilmore Laroche, FNP for primary care needs, as well as her other providers for optimal and coordinated care.   I spent  41  minutes in the care of the patient today including review of labs from CMP, Lipids, Thyroid Function, Hematology (current and previous including abstractions from other facilities); face-to-face time discussing  her blood glucose  readings/logs, discussing hypoglycemia and hyperglycemia episodes and symptoms, medications doses, her options of short and long term treatment based on the latest standards of care / guidelines;  discussion about incorporating lifestyle medicine;  and documenting the encounter. Risk reduction counseling performed per USPSTF guidelines to reduce  obesity and cardiovascular risk factors.     Please refer to Patient Instructions for Blood Glucose Monitoring and Insulin/Medications Dosing Guide"  in media tab for additional information. Please  also refer to " Patient Self Inventory" in the Media  tab for reviewed elements of pertinent patient history.  Selena Barr participated in the discussions, expressed understanding, and voiced agreement with the above plans.  All questions were answered to her satisfaction. she is encouraged to contact clinic should she have any questions or concerns prior to her return visit.     Follow up plan: - Return in about 4 weeks (around 03/13/2024) for F/U with Meter/CGM /Logs Only - no Labs.  Marquis Lunch, MD [facility] [address] [place], [place] [address] Phone: [phone]  Fax: [phone]    02/14/2024, 5:38 PM  This note was partially dictated with voice recognition software. Similar sounding words can be transcribed inadequately or may not  be corrected upon review.

## 2024-02-14 NOTE — Patient Instructions (Signed)

## 2024-03-03 ENCOUNTER — Other Ambulatory Visit: Payer: Self-pay | Admitting: Family Medicine

## 2024-03-03 DIAGNOSIS — I10 Essential (primary) hypertension: Secondary | ICD-10-CM

## 2024-03-10 ENCOUNTER — Telehealth: Payer: Self-pay | Admitting: *Deleted

## 2024-03-10 NOTE — Telephone Encounter (Signed)
 Received faxed referral from [facility] [phone]~ telephone/ [phone]~ fax for unspecified abscess.   Call placed to [facility] for more information. Requested notes from encounter to be faxed to office.   Call placed to patient. Patient reports intermittent abscess in different areas of her body. States that she went to [facility] in [place] and had I&D of area. States that area is healed now and does not require surgical intervention.   Referral to be cancelled per patient request.

## 2024-03-13 DIAGNOSIS — R109 Unspecified abdominal pain: Secondary | ICD-10-CM | POA: Diagnosis not present

## 2024-03-13 DIAGNOSIS — N200 Calculus of kidney: Secondary | ICD-10-CM | POA: Diagnosis not present

## 2024-03-13 DIAGNOSIS — I10 Essential (primary) hypertension: Secondary | ICD-10-CM | POA: Diagnosis not present

## 2024-03-13 DIAGNOSIS — R110 Nausea: Secondary | ICD-10-CM | POA: Diagnosis not present

## 2024-03-13 DIAGNOSIS — N390 Urinary tract infection, site not specified: Secondary | ICD-10-CM | POA: Diagnosis not present

## 2024-03-17 ENCOUNTER — Ambulatory Visit: Admitting: "Endocrinology

## 2024-04-02 ENCOUNTER — Other Ambulatory Visit: Payer: Self-pay | Admitting: Family Medicine

## 2024-04-02 DIAGNOSIS — I10 Essential (primary) hypertension: Secondary | ICD-10-CM

## 2024-04-03 ENCOUNTER — Encounter: Payer: Self-pay | Admitting: Internal Medicine

## 2024-04-03 ENCOUNTER — Ambulatory Visit: Payer: Self-pay | Admitting: Internal Medicine

## 2024-04-03 VITALS — BP 126/84 | HR 102 | Ht 63.0 in | Wt 333.8 lb

## 2024-04-03 DIAGNOSIS — J309 Allergic rhinitis, unspecified: Secondary | ICD-10-CM | POA: Insufficient documentation

## 2024-04-03 DIAGNOSIS — H66001 Acute suppurative otitis media without spontaneous rupture of ear drum, right ear: Secondary | ICD-10-CM | POA: Insufficient documentation

## 2024-04-03 MED ORDER — AMOXICILLIN-POT CLAVULANATE 875-125 MG PO TABS
1.0000 | ORAL_TABLET | Freq: Two times a day (BID) | ORAL | 0 refills | Status: DC
Start: 2024-04-03 — End: 2024-04-30

## 2024-04-03 MED ORDER — LEVOCETIRIZINE DIHYDROCHLORIDE 5 MG PO TABS: 5.0000 mg | ORAL_TABLET | Freq: Every evening | ORAL | 3 refills | Status: AC

## 2024-04-03 MED ORDER — OFLOXACIN 0.3 % OT SOLN: 5.0000 [drp] | Freq: Every day | OTIC | 0 refills | Status: DC

## 2024-04-03 NOTE — Progress Notes (Signed)
 Acute Office Visit  Subjective:    Patient ID: Selena Barr, female    DOB: 1990-12-28, 33 y.o.   MRN: [id number]  Chief Complaint  Patient presents with   Ear Pain    Pt reports sx of bilateral ear pain ongoing for 3 days . Having eye itchiness,  congestion and scratchy throat.     HPI Patient is in today for complaint of nasal congestion, postnasal drip, sore throat and bilateral ear pain.  She has noticed whitish discharge from the right ear as well.  She tried to apply peroxide eardrops and clean it with a Q-tip 2 days ago.  Denies any fever, chills, dyspnea or wheezing currently.  She also reports itching over bilateral eyes, but denies any redness or visual disturbance..  Past Medical History:  Diagnosis Date   Allergy     Diabetes mellitus without complication (HCC)    Hyperlipidemia 10/24/2016   Hypertension    Tobacco abuse 09/22/2016    Past Surgical History:  Procedure Laterality Date   WISDOM TOOTH EXTRACTION      Family History  Problem Relation Age of Onset   Hypertension Mother    Diabetes Mother    Alcohol abuse Mother    COPD Mother    Drug abuse Mother    Cancer Maternal Grandmother        throat   Cancer Maternal Grandfather        throat   Diabetes Maternal Grandfather     Social History   Socioeconomic History   Marital status: Single    Spouse name: Not on file   Number of children: 1   Years of education: 12   Highest education level: 11th grade  Occupational History    Comment: unemployed  Tobacco Use   Smoking status: Every Day    Current packs/day: 1.00    Average packs/day: 1 pack/day for 2.0 years (2.0 ttl pk-yrs)    Types: Cigarettes   Smokeless tobacco: Never   Tobacco comments:    1ppd  Vaping Use   Vaping status: Never Used  Substance and Sexual Activity   Alcohol use: No   Drug use: No    Comment: denies use 08/28/14   Sexual activity: Yes    Birth control/protection: Implant  Other Topics Concern   Not on file   Social History Narrative   Lives with mother and brother, sister, daughter   Social Drivers of Health   Financial Resource Strain: Low Risk  (01/10/2024)   Overall Financial Resource Strain (CARDIA)    Difficulty of Paying Living Expenses: Not hard at all  Food Insecurity: No Food Insecurity (01/10/2024)   Hunger Vital Sign    Worried About Running Out of Food in the Last Year: Never true    Ran Out of Food in the Last Year: Never true  Transportation Needs: No Transportation Needs (01/10/2024)   PRAPARE - Administrator, Civil Service (Medical): No    Lack of Transportation (Non-Medical): No  Physical Activity: Sufficiently Active (01/10/2024)   Exercise Vital Sign    Days of Exercise per Week: 7 days    Minutes of Exercise per Session: 150+ min  Stress: No Stress Concern Present (01/10/2024)   [employer] of Occupational Health - Occupational Stress Questionnaire    Feeling of Stress : Not at all  Recent Concern: Stress - Stress Concern Present (11/08/2023)   [employer] of Occupational Health - Occupational Stress Questionnaire    Feeling of Stress :  Rather much  Social Connections: Moderately Isolated (01/10/2024)   Social Connection and Isolation Panel [NHANES]    Frequency of Communication with Friends and Family: More than three times a week    Frequency of Social Gatherings with Friends and Family: More than three times a week    Attends Religious Services: Never    Database administrator or Organizations: No    Attends Engineer, structural: Not on file    Marital Status: Living with partner  Intimate Partner Violence: Not At Risk (11/23/2023)   Humiliation, Afraid, Rape, and Kick questionnaire    Fear of Current or Ex-Partner: No    Emotionally Abused: No    Physically Abused: No    Sexually Abused: No    Outpatient Medications Prior to Visit  Medication Sig Dispense Refill   Accu-Chek Softclix Lancets lancets 1 each by Other route 3 (three)  times daily.     albuterol  (VENTOLIN  HFA) 108 (90 Base) MCG/ACT inhaler Inhale 2 puffs into the lungs every 6 (six) hours as needed for wheezing or shortness of breath. 8 g 2   amlodipine -olmesartan  (AZOR ) 10-20 MG tablet Take 1 tablet by mouth once daily 30 tablet 0   Blood Glucose Monitoring Suppl DEVI 1 each by Does not apply route in the morning, at noon, and at bedtime. May substitute to any manufacturer covered by patient's insurance. 1 each 1   Blood Pressure KIT Take blood pressure once daily 1 kit 0   Etonogestrel  (NEXPLANON  [place]) Inject into the skin.     glipiZIDE  (GLUCOTROL  XL) 5 MG 24 hr tablet Take 1 tablet (5 mg total) by mouth daily with breakfast. 90 tablet 1   Glucose Blood (BLOOD GLUCOSE TEST STRIPS) STRP 1 each by In Vitro route 2 (two) times daily. May substitute to any manufacturer covered by patient's insurance. 180 each 3   metFORMIN  (GLUCOPHAGE -XR) 500 MG 24 hr tablet Take 1 tablet (500 mg total) by mouth 2 (two) times daily with a meal. 180 tablet 1   promethazine -dextromethorphan (PROMETHAZINE -DM) 6.25-15 MG/5ML syrup Take 5 mLs by mouth 4 (four) times daily as needed. 118 mL 0   rosuvastatin  (CRESTOR ) 10 MG tablet Take 1 tablet (10 mg total) by mouth daily. 90 tablet 3   Semaglutide ,0.25 or 0.5MG /DOS, (OZEMPIC , 0.25 OR 0.5 MG/DOSE,) 2 MG/3ML SOPN Inject 0.25 mg into the skin once a week. 6 mL 0   Vitamin D , Ergocalciferol , (DRISDOL ) 1.25 MG (50000 UNIT) CAPS capsule Take 1 capsule (50,000 Units total) by mouth every 7 (seven) days. (Patient taking differently: Take 50,000 Units by mouth every Wednesday.) 20 capsule 1   levocetirizine (XYZAL ) 5 MG tablet Take 1 tablet (5 mg total) by mouth every evening. 30 tablet 1   No facility-administered medications prior to visit.    Allergies  Allergen Reactions   Solu-Medrol  [Methylprednisolone  Sodium Succ]     Rash, lip swelling     Review of Systems  Constitutional:  Negative for chills and fever.  HENT:  Positive for  congestion, ear discharge (Right ear), ear pain, sinus pressure and sore throat.   Eyes:  Positive for itching. Negative for redness.  Respiratory:  Negative for cough and shortness of breath.   Cardiovascular:  Negative for chest pain and palpitations.  Gastrointestinal:  Negative for diarrhea, nausea and vomiting.  Genitourinary:  Negative for dysuria and hematuria.  Musculoskeletal:  Negative for neck pain and neck stiffness.  Skin:  Negative for rash.  Neurological:  Negative for dizziness and  weakness.  Psychiatric/Behavioral:  Negative for agitation and behavioral problems.        Objective:    Physical Exam Vitals reviewed.  Constitutional:      General: She is not in acute distress.    Appearance: She is obese. She is not diaphoretic.  HENT:     Head: Normocephalic and atraumatic.     Ears:     Comments: Erythematous right ear canal    Nose: Congestion present.     Mouth/Throat:     Mouth: Mucous membranes are moist.     Pharynx: Posterior oropharyngeal erythema present.  Eyes:     General: No scleral icterus.    Extraocular Movements: Extraocular movements intact.  Cardiovascular:     Rate and Rhythm: Normal rate and regular rhythm.     Heart sounds: Normal heart sounds. No murmur heard. Pulmonary:     Breath sounds: Normal breath sounds. No wheezing or rales.  Musculoskeletal:     Cervical back: Neck supple. No tenderness.     Right lower leg: No edema.     Left lower leg: No edema.  Skin:    General: Skin is warm.     Findings: No rash.  Neurological:     General: No focal deficit present.     Mental Status: She is alert and oriented to person, place, and time.  Psychiatric:        Mood and Affect: Mood normal.        Behavior: Behavior normal.     BP 126/84   Pulse (!) 102   Ht 5\' 3"  (1.6 m)   Wt (!) 333 lb 12.8 oz (151.4 kg)   SpO2 96%   BMI 59.13 kg/m  Wt Readings from Last 3 Encounters:  04/03/24 (!) 333 lb 12.8 oz (151.4 kg)  02/14/24 (!)  334 lb 3.2 oz (151.6 kg)  01/10/24 (!) 330 lb (149.7 kg)        Assessment & Plan:   Problem List Items Addressed This Visit       Respiratory   Allergic sinusitis   Eye itching and nasal congestion likely due to allergies Started Xyzal       Relevant Medications   amoxicillin -clavulanate (AUGMENTIN ) 875-125 MG tablet   levocetirizine (XYZAL ) 5 MG tablet     Nervous and Auditory   Non-recurrent acute suppurative otitis media of right ear without spontaneous rupture of tympanic membrane - Primary   Right ear canal erythematous with mild tenderness Started ofloxacin  eardrops Augmentin  for sinusitis and otitis media Advised to avoid using any sharp objects for cleaning purposes      Relevant Medications   amoxicillin -clavulanate (AUGMENTIN ) 875-125 MG tablet   ofloxacin  (FLOXIN ) 0.3 % OTIC solution     Meds ordered this encounter  Medications   amoxicillin -clavulanate (AUGMENTIN ) 875-125 MG tablet    Sig: Take 1 tablet by mouth 2 (two) times daily.    Dispense:  14 tablet    Refill:  0   ofloxacin  (FLOXIN ) 0.3 % OTIC solution    Sig: Place 5 drops into the right ear daily.    Dispense:  5 mL    Refill:  0   levocetirizine (XYZAL ) 5 MG tablet    Sig: Take 1 tablet (5 mg total) by mouth every evening.    Dispense:  30 tablet    Refill:  3     Delyle Weider Alyssa Backbone, MD

## 2024-04-03 NOTE — Assessment & Plan Note (Signed)
 Right ear canal erythematous with mild tenderness Started ofloxacin  eardrops Augmentin  for sinusitis and otitis media Advised to avoid using any sharp objects for cleaning purposes

## 2024-04-03 NOTE — Patient Instructions (Addendum)
 Please start taking Augmentin  as prescribed.  Please apply Ofloxacin  ear drops as prescribed into right ear.  Please start taking Xyzal  for allergies.

## 2024-04-03 NOTE — Assessment & Plan Note (Signed)
 Eye itching and nasal congestion likely due to allergies Started Xyzal 

## 2024-04-09 ENCOUNTER — Ambulatory Visit: Admitting: "Endocrinology

## 2024-04-09 ENCOUNTER — Encounter: Payer: Self-pay | Admitting: "Endocrinology

## 2024-04-10 ENCOUNTER — Ambulatory Visit: Payer: Self-pay

## 2024-04-10 NOTE — Telephone Encounter (Signed)
 Copied from CRM [id number]. Topic: Clinical - Red Word Triage >> Apr 10, 2024  9:01 AM Hassie Lint wrote: [facility] that prompted transfer to Nurse Triage: Patient is experiencing pain on her right side. Started last night around 5:30 and has been constant, says it is at a level 10.  Chief Complaint: right side pain; states feels like a kidney infection; 10/10 pain Symptoms: diarrhea, flank pain, bleeding Frequency: comes and goes Pertinent Negatives: Patient denies cp, sob Disposition: [x] ED /[] Urgent Care (no appt availability in office) / [] Appointment(In office/virtual)/ []  [facility]/ [] Home Care/ [] Refused Recommended Disposition /[] [facility]/ []  Follow-up with PCP Additional Notes: instructed to go to the ER; pcp office updated.   Reason for Disposition  [1] SEVERE pain (e.g., excruciating, scale 8-10) AND [2] present > 1 hour  Answer Assessment - Initial Assessment Questions 1. LOCATION: "Where does it hurt?" (e.g., left, right)     Right side 2. ONSET: "When did the pain start?"     yesterday 3. SEVERITY: "How bad is the pain?" (e.g., Scale 1-10; mild, moderate, or severe)   - MILD (1-3): doesn't interfere with normal activities    - MODERATE (4-7): interferes with normal activities or awakens from sleep    - SEVERE (8-10): excruciating pain and patient unable to do normal activities (stays in bed)       10/10 4. PATTERN: "Does the pain come and go, or is it constant?"      Comes and goes 5. CAUSE: "What do you think is causing the pain?"     Kidney infection is what it feels like 6. OTHER SYMPTOMS:  "Do you have any other symptoms?" (e.g., fever, abdomen pain, vomiting, leg weakness, burning with urination, blood in urine)     Diarrhea, bleeding 7. PREGNANCY:  "Is there any chance you are pregnant?" "When was your last menstrual period?"     no  Protocols used: Flank Pain-A-AH

## 2024-04-15 DIAGNOSIS — R109 Unspecified abdominal pain: Secondary | ICD-10-CM | POA: Diagnosis not present

## 2024-04-15 DIAGNOSIS — N3000 Acute cystitis without hematuria: Secondary | ICD-10-CM | POA: Diagnosis not present

## 2024-04-15 NOTE — Telephone Encounter (Signed)
 This is not our patient please send to the correct office

## 2024-04-30 ENCOUNTER — Ambulatory Visit: Payer: Self-pay

## 2024-04-30 VITALS — BP 134/83 | HR 91 | Ht 62.0 in | Wt 331.0 lb

## 2024-04-30 DIAGNOSIS — N390 Urinary tract infection, site not specified: Secondary | ICD-10-CM

## 2024-04-30 NOTE — Patient Instructions (Signed)
 Recommend adding D-mannose or cranberry supplement daily for preventative as well as increasing water intake daily.   Recommend urology consult if still having recurrent UTIs.

## 2024-04-30 NOTE — Progress Notes (Signed)
   Established Patient Office Visit  Subjective   Patient ID: Selena Barr, female    DOB: 1991-10-23  Age: 33 y.o. MRN: [id number]  Chief Complaint  Patient presents with   Hospitalization Follow-up    Hospital Follow-up for a kidney infection    HPI Patient went to urgent care 2 weeks ago for UTI and was treated with antibiotics.  She reports improvement of symptoms, but states that she gets frequent UTIs.  She denies any symptoms at this time   Past Medical History:  Diagnosis Date   Allergy     Diabetes mellitus without complication (HCC)    Hyperlipidemia 10/24/2016   Hypertension    Tobacco abuse 09/22/2016      ROS    Objective:     BP 134/83   Pulse 91   Ht 5\' 2"  (1.575 m)   Wt (!) 331 lb 0.6 oz (150.2 kg)   SpO2 96%   BMI 60.55 kg/m  BP Readings from Last 3 Encounters:  04/30/24 134/83  04/03/24 126/84  02/14/24 (!) 140/82   Wt Readings from Last 3 Encounters:  04/30/24 (!) 331 lb 0.6 oz (150.2 kg)  04/03/24 (!) 333 lb 12.8 oz (151.4 kg)  02/14/24 (!) 334 lb 3.2 oz (151.6 kg)      Physical Exam Vitals and nursing note reviewed.  Constitutional:      Appearance: Normal appearance. She is obese.  Eyes:     Extraocular Movements: Extraocular movements intact.     Pupils: Pupils are equal, round, and reactive to light.  Cardiovascular:     Rate and Rhythm: Normal rate and regular rhythm.  Pulmonary:     Effort: Pulmonary effort is normal.     Breath sounds: Normal breath sounds.  Abdominal:     Tenderness: There is no right CVA tenderness or left CVA tenderness.  Neurological:     Mental Status: She is alert and oriented to person, place, and time.  Psychiatric:        Mood and Affect: Mood normal.        Thought Content: Thought content normal.     No results found for any visits on 04/30/24.    The ASCVD Risk score (Arnett DK, et al., 2019) failed to calculate for the following reasons:   The 2019 ASCVD risk score is only valid  for ages 66 to 43    Assessment & Plan:   Problem List Items Addressed This Visit       Genitourinary   Recurrent UTI - Primary   She denies symptoms of UTI at this time.   Recently completed a course of oral antibiotics prescribed by urgent care.  I do not have access to these records and she is not certain what she was prescribed. Recommend increasing water intake, and consider adding a daily cranberry supplement or d-mannose to help prevent future UTIs.  I also advised her to urinate after sexual intercourse.       No follow-ups on file.    Alison Irvine, FNP

## 2024-04-30 NOTE — Assessment & Plan Note (Signed)
 She denies symptoms of UTI at this time.   Recently completed a course of oral antibiotics prescribed by urgent care.  I do not have access to these records and she is not certain what she was prescribed. Recommend increasing water intake, and consider adding a daily cranberry supplement or d-mannose to help prevent future UTIs.  I also advised her to urinate after sexual intercourse.

## 2024-05-09 ENCOUNTER — Other Ambulatory Visit: Payer: Self-pay | Admitting: Family Medicine

## 2024-05-09 DIAGNOSIS — I10 Essential (primary) hypertension: Secondary | ICD-10-CM

## 2024-05-28 ENCOUNTER — Ambulatory Visit: Payer: Self-pay

## 2024-06-22 ENCOUNTER — Other Ambulatory Visit: Payer: Self-pay | Admitting: Family Medicine

## 2024-06-22 DIAGNOSIS — I10 Essential (primary) hypertension: Secondary | ICD-10-CM

## 2024-06-26 ENCOUNTER — Encounter: Payer: Self-pay | Admitting: Emergency Medicine

## 2024-06-26 ENCOUNTER — Ambulatory Visit
Admission: EM | Admit: 2024-06-26 | Discharge: 2024-06-26 | Disposition: A | Discharge status: Home / Self Care | Attending: Nurse Practitioner | Admitting: Nurse Practitioner

## 2024-06-26 DIAGNOSIS — R350 Frequency of micturition: Secondary | ICD-10-CM

## 2024-06-26 DIAGNOSIS — R109 Unspecified abdominal pain: Secondary | ICD-10-CM

## 2024-06-26 DIAGNOSIS — Z87440 Personal history of urinary (tract) infections: Secondary | ICD-10-CM

## 2024-06-26 LAB — POCT URINALYSIS DIP (MANUAL ENTRY)
Bilirubin, UA: NEGATIVE
Blood, UA: NEGATIVE
Glucose, UA: NEGATIVE mg/dL
Ketones, POC UA: NEGATIVE mg/dL
Nitrite, UA: NEGATIVE
Protein Ur, POC: NEGATIVE mg/dL
Spec Grav, UA: 1.02 (ref 1.010–1.025)
Urobilinogen, UA: 0.2 U/dL
pH, UA: 7 (ref 5.0–8.0)

## 2024-06-26 MED ORDER — SULFAMETHOXAZOLE-TRIMETHOPRIM 800-160 MG PO TABS: 1.0000 | ORAL_TABLET | Freq: Two times a day (BID) | ORAL | 0 refills | Status: AC

## 2024-06-26 NOTE — Discharge Instructions (Signed)
 Urine culture has been ordered.  You will be contacted when the results of the culture are received.  You may be advised to stop the antibiotic if the culture is negative, or medication may be changed based on the culture result.  You also have access to your results via MyChart. Take medication as prescribed. Make sure you are drinking at least 8-10 eight ounce glasses of water daily while symptoms persist. You may take over-the-counter Tylenol  as needed for pain, fever, or general discomfort. Develop a toileting schedule that will allow you to urinate at least every 2 hours. Avoid caffeine  such as tea, soda, or coffee while symptoms persist. Monitor your symptoms for worsening.  If you develop fever, chills, worsening pain in your back, or worsening urinary symptoms, go to the emergency department immediately for further evaluation. Please follow-up with your PCP regarding referral to urology. Follow-up as needed.

## 2024-06-26 NOTE — ED Provider Notes (Signed)
 [facility]    CSN: [id number] Arrival date & time: 06/26/24  0806      History   Chief Complaint No chief complaint on file.   HPI Selena Barr is a 33 y.o. female.   The history is provided by the patient.   Patient presents with a 2-day history of right-sided low back pain.  Patient also complains of urinary frequency and nausea.  Patient denies fever, chills, dysuria, hematuria, decreased urine stream, urinary hesitancy, or vaginal symptoms.  Patient states this feels like it did when I had a kidney infection.  Patient also reports history of recurrent UTIs.  States she has seen her PCP for the same and a referral was to be placed, but she has not heard anything since that time.  Patient has not tried any medications for her symptoms.  Per review of the chart, last UTI was in May.  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   Recurrent UTI 11/23/2023   Type 2 diabetes mellitus with hyperglycemia (HCC) 11/23/2023   Nexplanon  insertion 2022-02-14   General counseling and advice for contraceptive management 01/30/2022   Pregnancy test negative 01/30/2022   Screen for STD (sexually transmitted disease) 01/30/2022   Routine cervical smear 01/30/2022   Menorrhagia with irregular cycle 01/30/2022   History of gestational diabetes 01/30/2022   Essential hypertension, benign 06/11/2020   Essential hypertension 02/15/19   Death of infant 08/04/18   Mixed hyperlipidemia 10/24/2016   Morbid obesity  (HCC) 09/22/2016   Tobacco abuse 09/22/2016    Past Surgical History:  Procedure Laterality Date   WISDOM TOOTH EXTRACTION      OB History     Gravida  3   Para  3   Term  3   Preterm      AB      Living  2      SAB      IAB      Ectopic      Multiple      Live Births  3            Home Medications    Prior to Admission medications   Medication Sig Start Date End Date Taking? Authorizing Provider  Accu-Chek Softclix Lancets lancets 1 each by Other route 3 (three) times daily. 12/23/23   [provider]  amlodipine -olmesartan  (AZOR ) 10-20 MG tablet Take 1 tablet by mouth once daily 06/23/24   Zarwolo, Gloria, FNP  Blood Glucose Monitoring Suppl DEVI 1 each by Does not apply route in the morning, at noon, and at bedtime. May substitute to any manufacturer covered by patient's insurance. 12/06/23   Del Wilhelmena Lloyd Sola, FNP  Etonogestrel  (NEXPLANON  [place]) Inject into the skin.    [provider]  glipiZIDE  (GLUCOTROL  XL) 5 MG 24 hr tablet Take 1 tablet (5 mg total) by mouth daily with breakfast. 02/14/24   Nida, Gebreselassie W, MD  levocetirizine (XYZAL ) 5 MG tablet Take 1 tablet (5 mg total) by mouth every evening. 04/03/24   Tobie, Suzzane  K, MD  rosuvastatin  (CRESTOR ) 10 MG tablet Take 1 tablet (10 mg total) by mouth daily. 11/11/23   Zarwolo, Gloria, FNP  Vitamin D , Ergocalciferol , (DRISDOL ) 1.25 MG (50000 UNIT) CAPS capsule Take 1 capsule (50,000 Units total) by mouth every 7 (seven) days. Patient taking differently: Take 50,000 Units by mouth every Wednesday. 11/11/23   Zarwolo, Gloria, FNP  hydrochlorothiazide  (HYDRODIURIL ) 25 MG tablet Take 1 tablet (25 mg total) by mouth daily. Patient not taking: Reported on 03/05/2019 02/06/19 05/06/20  Kizzie Suzen SAUNDERS, CNM  norethindrone  (MICRONOR ,CAMILA ,ERRIN ) 0.35 MG tablet Take 1 tablet (0.35 mg total) by mouth daily. 02/03/19 03/06/20  Kizzie Suzen SAUNDERS, CNM    Family History Family History  Problem  Relation Age of Onset   Hypertension Mother    Diabetes Mother    Alcohol abuse Mother    COPD Mother    Drug abuse Mother    Cancer Maternal Grandmother        throat   Cancer Maternal Grandfather        throat   Diabetes Maternal Grandfather     Social History Social History   Tobacco Use   Smoking status: Every Day    Current packs/day: 1.00    Average packs/day: 1 pack/day for 2.0 years (2.0 ttl pk-yrs)    Types: Cigarettes   Smokeless tobacco: Never   Tobacco comments:    1ppd  Vaping Use   Vaping status: Never Used  Substance Use Topics   Alcohol use: No   Drug use: No    Comment: denies use 08/28/14     Allergies   Solu-medrol  [methylprednisolone  sodium succ]   Review of Systems Review of Systems Per HPI  Physical Exam Triage Vital Signs ED Triage Vitals  Encounter Vitals Group     BP 06/26/24 0820 138/82     Girls Systolic BP Percentile --      Girls Diastolic BP Percentile --      Boys Systolic BP Percentile --      Boys Diastolic BP Percentile --      Pulse Rate 06/26/24 0820 88     Resp 06/26/24 0820 18     Temp 06/26/24 0820 98.4 F (36.9 C)     Temp Source 06/26/24 0820 Oral     SpO2 06/26/24 0820 95 %     Weight --      Height --      Head Circumference --      Peak Flow --      Pain Score 06/26/24 0821 0     Pain Loc --      Pain Education --      Exclude from Growth Chart --    No data found.  Updated Vital Signs BP 138/82 (BP Location: Right Arm)   Pulse 88   Temp 98.4 F (36.9 C) (Oral)   Resp 18   SpO2 95%   Visual Acuity Right Eye Distance:   Left Eye Distance:   Bilateral Distance:    Right Eye Near:   Left Eye Near:    Bilateral Near:     Physical Exam Vitals and nursing note reviewed.  Constitutional:      General: She is not in acute distress.    Appearance: Normal appearance.  HENT:     Head: Normocephalic.  Eyes:     Extraocular Movements: Extraocular movements intact.     Conjunctiva/sclera:  Conjunctivae normal.     Pupils: Pupils are equal, round, and reactive to light.  Cardiovascular:     Rate and Rhythm: Normal rate and regular rhythm.     Pulses: Normal pulses.     Heart sounds: Normal heart sounds.  Pulmonary:     Effort: Pulmonary effort is normal. No respiratory distress.     Breath sounds: Normal breath sounds. No stridor. No wheezing, rhonchi or rales.  Abdominal:     General: Bowel sounds are normal.     Palpations: Abdomen is soft.     Tenderness: There is no abdominal tenderness. There is no right CVA tenderness or left CVA tenderness.  Musculoskeletal:     Cervical back: Normal range of motion.  Skin:    General: Skin is warm and dry.  Neurological:     General: No focal deficit present.     Mental Status: She is alert and oriented to person, place, and time.  Psychiatric:        Mood and Affect: Mood normal.        Behavior: Behavior normal.      UC Treatments / Results  Labs (all labs ordered are listed, but only abnormal results are displayed) Labs Reviewed  POCT URINALYSIS DIP (MANUAL ENTRY) - Abnormal; Notable for the following components:      Result Value   Clarity, UA cloudy (*)    Leukocytes, UA Moderate (2+) (*)    All other components within normal limits    EKG   Radiology No results found.  Procedures Procedures (including critical care time)  Medications Ordered in UC Medications - No data to display  Initial Impression / Assessment and Plan / UC Course  I have reviewed the triage vital signs and the nursing notes.  Pertinent labs & imaging results that were available during my care of the patient were reviewed by me and considered in my medical decision making (see chart for details).  On exam, patient has no suprapubic tenderness or CVA tenderness bilaterally.  Her vital signs are stable, and she is well-appearing at this time, urinalysis is positive for moderate leukocytes and cloudiness which is suggestive of a UTI.   Will treat empirically with Bactrim  DS 800/160 mg to cover for possible kidney infection as well given her history.  Urine culture is pending at this time.  Supportive care recommendations were provided and discussed with the patient to include fluids, over-the-counter analgesics, developing a toileting schedule, avoiding caffeine , and to monitor for worsening symptoms.  Patient was given strict ER follow-up precautions, along with indications to follow-up with her PCP regarding referral to urology.  Patient was in agreement with this plan of care and verbalizes understanding.  All questions were answered.  Patient stable for discharge.  Final Clinical Impressions(s) / UC Diagnoses   Final diagnoses:  None   Discharge Instructions   None    ED Prescriptions   None    PDMP not reviewed this encounter.   Gilmer Etta PARAS, NP 06/26/24 [phone]

## 2024-06-26 NOTE — ED Triage Notes (Signed)
 Lower back pain x 2 days.  States is urinating frequently.

## 2024-06-27 LAB — URINE CULTURE: Culture: <10000 — AB

## 2024-06-30 ENCOUNTER — Ambulatory Visit (HOSPITAL_COMMUNITY): Payer: Self-pay

## 2024-08-03 ENCOUNTER — Other Ambulatory Visit: Payer: Self-pay | Admitting: Family Medicine

## 2024-08-03 DIAGNOSIS — I10 Essential (primary) hypertension: Secondary | ICD-10-CM

## 2024-08-14 ENCOUNTER — Ambulatory Visit (INDEPENDENT_AMBULATORY_CARE_PROVIDER_SITE_OTHER): Admitting: Family Medicine

## 2024-08-14 VITALS — BP 133/80 | HR 96 | Resp 16 | Ht 63.0 in | Wt 339.1 lb

## 2024-08-14 DIAGNOSIS — R350 Frequency of micturition: Secondary | ICD-10-CM

## 2024-08-14 MED ORDER — SULFAMETHOXAZOLE-TRIMETHOPRIM 800-160 MG PO TABS: 1.0000 | ORAL_TABLET | Freq: Two times a day (BID) | ORAL | 0 refills | Status: AC

## 2024-08-14 NOTE — Progress Notes (Signed)
 Acute Office Visit  Subjective:    Patient ID: Selena Barr, female    DOB: 23-Jul-1991, 33 y.o.   MRN: [id number]  Chief Complaint  Patient presents with   Abdominal Pain    Has been having pain x 3 days that comes and goes in her lower right side. States she has had similar pain with UTI in the past. No dysuria but is having frequency     HPI Patient is in today for the above complaints. For the details of today's visit, please refer to the assessment and plan.     Past Medical History:  Diagnosis Date   Allergy     Diabetes mellitus without complication (HCC)    Hyperlipidemia 10/24/2016   Hypertension    Tobacco abuse 09/22/2016    Past Surgical History:  Procedure Laterality Date   WISDOM TOOTH EXTRACTION      Family History  Problem Relation Age of Onset   Hypertension Mother    Diabetes Mother    Alcohol abuse Mother    COPD Mother    Drug abuse Mother    Cancer Maternal Grandmother        throat   Cancer Maternal Grandfather        throat   Diabetes Maternal Grandfather     Social History   Socioeconomic History   Marital status: Single    Spouse name: Not on file   Number of children: 1   Years of education: 12   Highest education level: 11th grade  Occupational History    Comment: unemployed  Tobacco Use   Smoking status: Every Day    Current packs/day: 1.00    Average packs/day: 1 pack/day for 2.0 years (2.0 ttl pk-yrs)    Types: Cigarettes   Smokeless tobacco: Never   Tobacco comments:    1ppd  Vaping Use   Vaping status: Never Used  Substance and Sexual Activity   Alcohol use: No   Drug use: No    Comment: denies use 08/28/14   Sexual activity: Yes    Birth control/protection: Implant  Other Topics Concern   Not on file  Social History Narrative   Lives with mother and brother, sister, daughter   Social Drivers of Health   Financial Resource Strain: Low Risk  (01/10/2024)   Overall Financial Resource Strain (CARDIA)     Difficulty of Paying Living Expenses: Not hard at all  Food Insecurity: No Food Insecurity (01/10/2024)   Hunger Vital Sign    Worried About Running Out of Food in the Last Year: Never true    Ran Out of Food in the Last Year: Never true  Transportation Needs: No Transportation Needs (01/10/2024)   PRAPARE - Administrator, Civil Service (Medical): No    Lack of Transportation (Non-Medical): No  Physical Activity: Sufficiently Active (01/10/2024)   Exercise Vital Sign    Days of Exercise per Week: 7 days    Minutes of Exercise per Session: 150+ min  Stress: No Stress Concern Present (01/10/2024)   [employer] of Occupational Health - Occupational Stress Questionnaire    Feeling of Stress : Not at all  Recent Concern: Stress - Stress Concern Present (11/08/2023)   [employer] of Occupational Health - Occupational Stress Questionnaire    Feeling of Stress : Rather much  Social Connections: Moderately Isolated (01/10/2024)   Social Connection and Isolation Panel    Frequency of Communication with Friends and Family: More than three times a week  Frequency of Social Gatherings with Friends and Family: More than three times a week    Attends Religious Services: Never    Database administrator or Organizations: No    Attends Engineer, structural: Not on file    Marital Status: Living with partner  Intimate Partner Violence: Not At Risk (11/23/2023)   Humiliation, Afraid, Rape, and Kick questionnaire    Fear of Current or Ex-Partner: No    Emotionally Abused: No    Physically Abused: No    Sexually Abused: No    Outpatient Medications Prior to Visit  Medication Sig Dispense Refill   Accu-Chek Softclix Lancets lancets 1 each by Other route 3 (three) times daily.     amlodipine -olmesartan  (AZOR ) 10-20 MG tablet Take 1 tablet by mouth once daily 30 tablet 0   Blood Glucose Monitoring Suppl DEVI 1 each by Does not apply route in the morning, at noon, and at bedtime.  May substitute to any manufacturer covered by patient's insurance. 1 each 1   Etonogestrel  (NEXPLANON  [place]) Inject into the skin.     glipiZIDE  (GLUCOTROL  XL) 5 MG 24 hr tablet Take 1 tablet (5 mg total) by mouth daily with breakfast. 90 tablet 1   levocetirizine (XYZAL ) 5 MG tablet Take 1 tablet (5 mg total) by mouth every evening. 30 tablet 3   rosuvastatin  (CRESTOR ) 10 MG tablet Take 1 tablet (10 mg total) by mouth daily. 90 tablet 3   Vitamin D , Ergocalciferol , (DRISDOL ) 1.25 MG (50000 UNIT) CAPS capsule Take 1 capsule (50,000 Units total) by mouth every 7 (seven) days. (Patient taking differently: Take 50,000 Units by mouth every Wednesday.) 20 capsule 1   No facility-administered medications prior to visit.    Allergies  Allergen Reactions   Solu-Medrol  [Methylprednisolone  Sodium Succ]     Rash, lip swelling     Review of Systems  Constitutional:  Negative for chills, fatigue and fever.  Eyes:  Negative for visual disturbance.  Respiratory:  Negative for chest tightness and shortness of breath.   Gastrointestinal:  Positive for abdominal pain.  Genitourinary:  Positive for frequency and urgency.  Neurological:  Negative for dizziness and headaches.       Objective:    Physical Exam HENT:     Head: Normocephalic.     Mouth/Throat:     Mouth: Mucous membranes are moist.  Cardiovascular:     Rate and Rhythm: Normal rate.     Heart sounds: Normal heart sounds.  Pulmonary:     Effort: Pulmonary effort is normal.     Breath sounds: Normal breath sounds.  Abdominal:     General: Bowel sounds are normal.     Tenderness: There is abdominal tenderness in the right lower quadrant.  Neurological:     Mental Status: She is alert.     BP 133/80   Pulse 96   Resp 16   Ht 5' 3 (1.6 m)   Wt (!) 339 lb 1.9 oz (153.8 kg)   SpO2 96%   BMI 60.07 kg/m  Wt Readings from Last 3 Encounters:  08/14/24 (!) 339 lb 1.9 oz (153.8 kg)  04/30/24 (!) 331 lb 0.6 oz (150.2 kg)  04/03/24  (!) 333 lb 12.8 oz (151.4 kg)       Assessment & Plan:  Urinary frequency Assessment & Plan: UA shows a trace of leukocytes Please ensure you complete the full course of the antibiotics as prescribed. Initiate therapy with Bactrim  (sulfamethoxazole /trimethoprim ) 1 tablet twice daily for 5  days. Emphasize the importance of completing the full course of antibiotics as prescribed. Preventative Measures Discussed for UTIs:  Avoid Prolonged Urination: Do not hold urine for extended periods, as this can stretch the bladder and create an environment conducive to bacterial growth. Prompt Bladder Emptying: Empty the bladder as soon as you feel the urge to prevent bacterial buildup. Post-Intercourse Hygiene: Empty the bladder soon after intercourse to reduce the risk of infection. Shower Instead of Bath: Opt for showers rather than baths to minimize bacterial exposure. Proper Wiping Technique: Always wipe from front to back after urinating or bowel movements to prevent the transfer of bacteria from the anal region to the urethra or vagina. Hydration: Drink plenty of water regularly to help flush bacteria from the urinary system. The patient verbalized understanding of the treatment plan and preventative measures. Follow-up will be scheduled based on the culture results or if symptoms persist or worsen.   Orders: -     Urinalysis -     Urine Culture -     Sulfamethoxazole -Trimethoprim ; Take 1 tablet by mouth 2 (two) times daily for 5 days.  Dispense: 10 tablet; Refill: 0  Note: This chart has been completed using Engineer, civil (consulting) software, and while attempts have been made to ensure accuracy, certain words and phrases may not be transcribed as intended.    Shametra Cumberland, FNP

## 2024-08-14 NOTE — Assessment & Plan Note (Signed)
 UA shows a trace of leukocytes Please ensure you complete the full course of the antibiotics as prescribed. Initiate therapy with Bactrim  (sulfamethoxazole /trimethoprim ) 1 tablet twice daily for 5 days. Emphasize the importance of completing the full course of antibiotics as prescribed. Preventative Measures Discussed for UTIs:  Avoid Prolonged Urination: Do not hold urine for extended periods, as this can stretch the bladder and create an environment conducive to bacterial growth. Prompt Bladder Emptying: Empty the bladder as soon as you feel the urge to prevent bacterial buildup. Post-Intercourse Hygiene: Empty the bladder soon after intercourse to reduce the risk of infection. Shower Instead of Bath: Opt for showers rather than baths to minimize bacterial exposure. Proper Wiping Technique: Always wipe from front to back after urinating or bowel movements to prevent the transfer of bacteria from the anal region to the urethra or vagina. Hydration: Drink plenty of water regularly to help flush bacteria from the urinary system. The patient verbalized understanding of the treatment plan and preventative measures. Follow-up will be scheduled based on the culture results or if symptoms persist or worsen.

## 2024-08-14 NOTE — Patient Instructions (Signed)
 I appreciate the opportunity to provide care to you today!  Please start taking Bactrim  (sulfamethoxazole /trimethoprim ) 1 tablet twice daily for 5 days.  Please ensure you complete the full course of the antibiotics as prescribed.  To help prevent future urinary tract infections (UTIs), consider the following practices:  Avoid Prolonged Urination: Do not hold urine for extended periods, as this can stretch the bladder and create an environment conducive to bacterial growth. Prompt Bladder Emptying: Empty your bladder as soon as you feel the urge. Post-Intercourse Hygiene: Empty your bladder soon after intercourse to reduce the risk of infection. Shower Instead of Bath: Opt for showers rather than baths to minimize bacterial exposure. Proper Wiping Technique: Wipe from front to back after urinating and bowel movements to prevent the transfer of bacteria from the anal region to the vagina and urethra. Hydration: Drink a full glass of water regularly to help flush bacteria from your system.    Please continue to a heart-healthy diet and increase your physical activities. Try to exercise for at least five days a week.    It was a pleasure to see you and I look forward to continuing to work together on your health and well-being. Please do not hesitate to call the office if you need care or have questions about your care.  In case of emergency, please visit the Emergency Department for urgent care, or contact our clinic at [phone] to schedule an appointment. We're here to help you!   Have a wonderful day and week. With Gratitude, Yarianna Varble MSN, FNP-BC

## 2024-08-16 LAB — URINALYSIS
Bilirubin, UA: NEGATIVE
Glucose, UA: NEGATIVE
Ketones, UA: NEGATIVE
Nitrite, UA: NEGATIVE
Protein,UA: NEGATIVE
RBC, UA: NEGATIVE
Specific Gravity, UA: >1.03 — ABNORMAL HIGH (ref 1.005–1.030)
Urobilinogen, Ur: 0.2 mg/dL (ref 0.2–1.0)
pH, UA: 5.5 (ref 5.0–7.5)

## 2024-08-16 LAB — URINE CULTURE

## 2024-09-24 ENCOUNTER — Other Ambulatory Visit: Payer: Self-pay | Admitting: Family Medicine

## 2024-09-24 DIAGNOSIS — I10 Essential (primary) hypertension: Secondary | ICD-10-CM

## 2024-10-13 ENCOUNTER — Other Ambulatory Visit: Payer: Self-pay | Admitting: Family Medicine

## 2024-10-13 ENCOUNTER — Ambulatory Visit: Payer: Self-pay

## 2024-10-13 DIAGNOSIS — R350 Frequency of micturition: Secondary | ICD-10-CM

## 2024-10-13 MED ORDER — SULFAMETHOXAZOLE-TRIMETHOPRIM 800-160 MG PO TABS: 1.0000 | ORAL_TABLET | Freq: Two times a day (BID) | ORAL | 0 refills | Status: AC

## 2024-10-13 NOTE — Telephone Encounter (Signed)
 FYI Only or Action Required?: Action required by provider: clinical question for provider.  Patient was last seen in primary care on 08/14/2024 by Edman Meade PEDLAR, FNP.  Called Nurse Triage reporting urinary symptoms.  Symptoms began several days ago.  Interventions attempted: Nothing.  Symptoms are: gradually worsening.Has urinary frequency, stomach pain, weak stream. Asking to come in and leave a urine sample, my PCP lets me do that and checks my urine. Please advise pt.  Triage Disposition: See Physician Within 24 Hours  Patient/caregiver understands and will follow disposition?: No, wishes to speak with PCP     Copied from CRM [id number]. Topic: Clinical - Red Word Triage >> Oct 13, 2024 12:23 PM Hadassah PARAS wrote: Red Word that prompted transfer to Nurse Triage: Pt is experiencing frequent urination and abdominal pain; believes she has a UTI Reason for Disposition  Urinating more frequently than usual (i.e., frequency) OR new-onset of the feeling of an urgent need to urinate (i.e., urgency)  Answer Assessment - Initial Assessment Questions 1. SYMPTOM: What's the main symptom you're concerned about? (e.g., frequency, incontinence)     Frequency, abdominal pain. Weak stream 2. ONSET: When did the    start?     Saturday 3. PAIN: Is there any pain? If Yes, ask: How bad is it? (Scale: 1-10; mild, moderate, severe)     7 4. CAUSE: What do you think is causing the symptoms?     UTI 5. OTHER SYMPTOMS: Do you have any other symptoms? (e.g., blood in urine, fever, flank pain, pain with urination)     Fever last night 6. PREGNANCY: Is there any chance you are pregnant? When was your last menstrual period?     NO  Protocols used: Urinary Symptoms-A-AH

## 2024-10-13 NOTE — Telephone Encounter (Signed)
 Orders have been placed for a urinalysis. Please encourage the patient to stop by the office to complete the test at their earliest convenience.

## 2024-10-13 NOTE — Telephone Encounter (Signed)
 Message sent to patient

## 2024-10-13 NOTE — Telephone Encounter (Signed)
 Patient returned call. Is on her way to give urinalysis.

## 2024-10-15 LAB — URINALYSIS
Bilirubin, UA: NEGATIVE
Glucose, UA: NEGATIVE
Ketones, UA: NEGATIVE
Nitrite, UA: NEGATIVE
Protein,UA: NEGATIVE
RBC, UA: NEGATIVE
Specific Gravity, UA: 1.02 (ref 1.005–1.030)
Urobilinogen, Ur: 0.2 mg/dL (ref 0.2–1.0)
pH, UA: 6.5 (ref 5.0–7.5)

## 2024-11-02 DIAGNOSIS — E1165 Type 2 diabetes mellitus with hyperglycemia: Secondary | ICD-10-CM | POA: Diagnosis not present

## 2024-11-02 DIAGNOSIS — R110 Nausea: Secondary | ICD-10-CM | POA: Diagnosis not present

## 2024-11-02 DIAGNOSIS — I10 Essential (primary) hypertension: Secondary | ICD-10-CM | POA: Diagnosis not present

## 2024-11-05 ENCOUNTER — Other Ambulatory Visit (HOSPITAL_COMMUNITY): Payer: Self-pay

## 2024-11-05 ENCOUNTER — Telehealth: Payer: Self-pay | Admitting: Pharmacy Technician

## 2024-11-05 NOTE — Telephone Encounter (Signed)
 Pharmacy Patient Advocate Encounter   Received notification from CoverMyMeds that prior authorization for Ozempic  (0.25 or 0.5 MG/DOSE) 2MG /3ML pen-injectors is due for renewal.   Insurance verification completed.   The patient is insured through [employer].  Action: Medication has been discontinued. Archived Key: [id number]

## 2024-11-14 ENCOUNTER — Other Ambulatory Visit: Payer: Self-pay | Admitting: Family Medicine

## 2024-11-14 DIAGNOSIS — I10 Essential (primary) hypertension: Secondary | ICD-10-CM

## 2024-12-10 ENCOUNTER — Other Ambulatory Visit: Payer: Self-pay | Admitting: Family Medicine

## 2024-12-10 DIAGNOSIS — I10 Essential (primary) hypertension: Secondary | ICD-10-CM

## 2025-01-08 ENCOUNTER — Other Ambulatory Visit: Payer: Self-pay | Admitting: Family Medicine

## 2025-01-08 DIAGNOSIS — I10 Essential (primary) hypertension: Secondary | ICD-10-CM
